# Patient Record
Sex: Female | Born: 1955 | Race: White | Hispanic: No | Smoking: Current every day smoker
Health system: Southern US, Community
[De-identification: ages and names within clinical notes are randomized; demographics above are authoritative.]

## PROBLEM LIST (undated history)

## (undated) DIAGNOSIS — R011 Cardiac murmur, unspecified: Secondary | ICD-10-CM

## (undated) DIAGNOSIS — J42 Unspecified chronic bronchitis: Secondary | ICD-10-CM

## (undated) DIAGNOSIS — E785 Hyperlipidemia, unspecified: Secondary | ICD-10-CM

## (undated) DIAGNOSIS — Z955 Presence of coronary angioplasty implant and graft: Secondary | ICD-10-CM

## (undated) DIAGNOSIS — I255 Ischemic cardiomyopathy: Secondary | ICD-10-CM

## (undated) DIAGNOSIS — R053 Chronic cough: Secondary | ICD-10-CM

## (undated) DIAGNOSIS — J449 Chronic obstructive pulmonary disease, unspecified: Secondary | ICD-10-CM

## (undated) DIAGNOSIS — K219 Gastro-esophageal reflux disease without esophagitis: Secondary | ICD-10-CM

## (undated) DIAGNOSIS — R05 Cough: Secondary | ICD-10-CM

## (undated) DIAGNOSIS — I2510 Atherosclerotic heart disease of native coronary artery without angina pectoris: Secondary | ICD-10-CM

## (undated) DIAGNOSIS — I499 Cardiac arrhythmia, unspecified: Secondary | ICD-10-CM

## (undated) DIAGNOSIS — Z720 Tobacco use: Secondary | ICD-10-CM

## (undated) DIAGNOSIS — I219 Acute myocardial infarction, unspecified: Secondary | ICD-10-CM

## (undated) DIAGNOSIS — Z972 Presence of dental prosthetic device (complete) (partial): Secondary | ICD-10-CM

## (undated) HISTORY — DX: Tobacco use: Z72.0

## (undated) HISTORY — DX: Ischemic cardiomyopathy: I25.5

## (undated) HISTORY — DX: Atherosclerotic heart disease of native coronary artery without angina pectoris: I25.10

## (undated) HISTORY — DX: Hyperlipidemia, unspecified: E78.5

---
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
## 2010-10-10 ENCOUNTER — Emergency Department: Payer: Self-pay | Admitting: Emergency Medicine

## 2015-06-03 DIAGNOSIS — H2513 Age-related nuclear cataract, bilateral: Secondary | ICD-10-CM | POA: Diagnosis not present

## 2015-07-26 ENCOUNTER — Encounter: Payer: Self-pay | Admitting: Emergency Medicine

## 2015-07-26 ENCOUNTER — Emergency Department
Admission: EM | Admit: 2015-07-26 | Discharge: 2015-07-26 | Disposition: A | Discharge status: Home / Self Care | Payer: Medicare Other | Attending: Emergency Medicine | Admitting: Emergency Medicine

## 2015-07-26 ENCOUNTER — Emergency Department: Payer: Medicare Other

## 2015-07-26 DIAGNOSIS — F17200 Nicotine dependence, unspecified, uncomplicated: Secondary | ICD-10-CM | POA: Diagnosis not present

## 2015-07-26 DIAGNOSIS — F17210 Nicotine dependence, cigarettes, uncomplicated: Secondary | ICD-10-CM | POA: Diagnosis not present

## 2015-07-26 DIAGNOSIS — J069 Acute upper respiratory infection, unspecified: Secondary | ICD-10-CM | POA: Insufficient documentation

## 2015-07-26 DIAGNOSIS — R05 Cough: Secondary | ICD-10-CM | POA: Diagnosis not present

## 2015-07-26 MED ORDER — IPRATROPIUM-ALBUTEROL 0.5-2.5 (3) MG/3ML IN SOLN
3.0000 mL | Freq: Once | RESPIRATORY_TRACT | Status: AC
  Administered 2015-07-26: 3 mL via RESPIRATORY_TRACT
  Filled 2015-07-26: qty 3

## 2015-07-26 MED ORDER — HYDROCOD POLST-CPM POLST ER 10-8 MG/5ML PO SUER: 5.0000 mL | Freq: Two times a day (BID) | ORAL | Status: DC | PRN

## 2015-07-26 MED ORDER — ALBUTEROL SULFATE HFA 108 (90 BASE) MCG/ACT IN AERS: 2.0000 | INHALATION_SPRAY | Freq: Four times a day (QID) | RESPIRATORY_TRACT | Status: DC | PRN

## 2015-07-26 NOTE — ED Notes (Signed)
Pt with congestion left. Clear BS right. A/o. Sob. Smoker x 45 yrs. Heart sounds normal

## 2015-07-26 NOTE — Discharge Instructions (Signed)
Follow-up with [facility] clinic if any continued problems. Tylenol or ibuprofen as needed for fever. Tussionex every 12 hours as needed for cough. Albuterol inhaler at 2 puffs 4 times a day as needed for wheezing.

## 2015-07-26 NOTE — ED Provider Notes (Signed)
[facility] Emergency Department Provider Note ____________________________________________  Time seen: Approximately 12:00 PM  I have reviewed the triage vital signs and the nursing notes.   HISTORY  Chief Complaint Cough and Nasal Congestion   HPI [name] is a 60 y.o. female is here complaining of cough and congestion for approximately one week. Patient states she has coughed and sneezed until she has pain in the left lateral rib cage due to coughing. She is unaware of any fever. She states she's been taking ibuprofen and Alka-Seltzer at home for her symptoms. She states she is a smoker of 45 years and currently smokes 1 pack cigarettes per day. She states that she has been on inhalers in the past but is not using one currently.Currently she rates her discomfort as a 6/10.  History reviewed. No pertinent past medical history.  There are no active problems to display for this patient.   History reviewed. No pertinent past surgical history.  Current Outpatient Rx  Name  Route  Sig  Dispense  Refill  . albuterol (PROVENTIL HFA;VENTOLIN HFA) 108 (90 Base) MCG/ACT inhaler   Inhalation   Inhale 2 puffs into the lungs every 6 (six) hours as needed for wheezing or shortness of breath.   1 Inhaler   2   . chlorpheniramine-HYDROcodone (TUSSIONEX PENNKINETIC ER) 10-8 MG/5ML SUER   Oral   Take 5 mLs by mouth every 12 (twelve) hours as needed for cough.   100 mL   0     Allergies Penicillins  History reviewed. No pertinent family history.  Social History Social History  Substance Use Topics  . Smoking status: Current Every Day Smoker  . Smokeless tobacco: None  . Alcohol Use: No    Review of Systems Constitutional: No fever/chills ENT: No sore throat. Cardiovascular: Left lateral rib pain Respiratory: Denies shortness of breath. Positive cough Gastrointestinal: No abdominal pain.  No nausea, no vomiting.  Musculoskeletal: Negative for  back pain. Skin: Negative for rash. Neurological: Negative for headaches  10-point ROS otherwise negative.  ____________________________________________   PHYSICAL EXAM:  VITAL SIGNS: ED Triage Vitals  Enc Vitals Group     BP 07/26/15 1124 160/108 mmHg     Pulse Rate 07/26/15 1124 75     Resp 07/26/15 1124 20     Temp 07/26/15 1124 97.8 F (36.6 C)     Temp Source 07/26/15 1124 Oral     SpO2 07/26/15 1124 95 %     Weight 07/26/15 1124 180 lb (81.647 kg)     Height 07/26/15 1124 5\' 4"  (1.626 m)     Head Cir --      Peak Flow --      Pain Score 07/26/15 1124 6     Pain Loc --      Pain Edu? --      Excl. in GC? --     Constitutional: Alert and oriented. Well appearing and in no acute distress. Eyes: Conjunctivae are normal. PERRL. EOMI. Head: Atraumatic. Nose: Minimal congestion/rhinnorhea. EACs and TMs are clear bilaterally. Mouth/Throat: Mucous membranes are moist.  Oropharynx non-erythematous. Mild posterior drainage present. Neck: No stridor.   Hematological/Lymphatic/Immunilogical: No cervical lymphadenopathy. Cardiovascular: Normal rate, regular rhythm. Grossly normal heart sounds.  Good peripheral circulation. Respiratory: Normal respiratory effort.  No retractions. Lungs there is a faint expiratory wheeze heard throughout with a very congested cough. Patient is in no respiratory distress. Musculoskeletal moves upper and lower extremities without any difficulty and normal gait was noted. Neurologic:  Normal speech and language. No gross focal neurologic deficits are appreciated. No gait instability. Skin:  Skin is warm, dry and intact. No rash noted. Psychiatric: Mood and affect are normal. Speech and behavior are normal.  ____________________________________________   LABS (all labs ordered are listed, but only abnormal results are displayed)  Labs Reviewed - No data to display ____________________________________________  RADIOLOGY  Chest x-ray per  radiologist shows no active cardiopulmonary disease. ____________________________________________   PROCEDURES  Procedure(s) performed: None  Critical Care performed: No  ____________________________________________   INITIAL IMPRESSION / ASSESSMENT AND PLAN / ED COURSE  Pertinent labs & imaging results that were available during my care of the patient were reviewed by me and considered in my medical decision making (see chart for details).  Patient is given a DuoNeb treatment while in the emergency room and wheezing cleared completely. Patient is moving more air and also notices a difference. Patient was discharged with albuterol inhaler and Tussionex as needed for cough. She is follow-up with [facility] clinic if any continued problems. ____________________________________________   FINAL CLINICAL IMPRESSION(S) / ED DIAGNOSES  Final diagnoses:  Acute upper respiratory infection      Tommi Rumps, PA-C 07/26/15 1330  Sharman Cheek, MD 07/26/15 [phone]

## 2015-07-26 NOTE — ED Notes (Signed)
Pt to ed with c/o cough, congestion x 1 week.  Pt states she has coughed so much now she is having left side pain with deep breath, movement, and with cough or sneezing.

## 2015-09-28 ENCOUNTER — Emergency Department: Payer: Medicare Other

## 2015-09-28 ENCOUNTER — Emergency Department
Admission: EM | Admit: 2015-09-28 | Discharge: 2015-09-28 | Disposition: A | Discharge status: Home / Self Care | Payer: Medicare Other | Attending: Emergency Medicine | Admitting: Emergency Medicine

## 2015-09-28 ENCOUNTER — Encounter: Payer: Self-pay | Admitting: Emergency Medicine

## 2015-09-28 DIAGNOSIS — F17200 Nicotine dependence, unspecified, uncomplicated: Secondary | ICD-10-CM | POA: Diagnosis not present

## 2015-09-28 DIAGNOSIS — S63282A Dislocation of proximal interphalangeal joint of right middle finger, initial encounter: Secondary | ICD-10-CM | POA: Insufficient documentation

## 2015-09-28 DIAGNOSIS — S62602A Fracture of unspecified phalanx of right middle finger, initial encounter for closed fracture: Secondary | ICD-10-CM | POA: Diagnosis not present

## 2015-09-28 DIAGNOSIS — Y9389 Activity, other specified: Secondary | ICD-10-CM | POA: Insufficient documentation

## 2015-09-28 DIAGNOSIS — Z79899 Other long term (current) drug therapy: Secondary | ICD-10-CM | POA: Insufficient documentation

## 2015-09-28 DIAGNOSIS — Y999 Unspecified external cause status: Secondary | ICD-10-CM | POA: Insufficient documentation

## 2015-09-28 DIAGNOSIS — Y929 Unspecified place or not applicable: Secondary | ICD-10-CM | POA: Diagnosis not present

## 2015-09-28 DIAGNOSIS — S6991XA Unspecified injury of right wrist, hand and finger(s), initial encounter: Secondary | ICD-10-CM | POA: Diagnosis present

## 2015-09-28 DIAGNOSIS — W010XXA Fall on same level from slipping, tripping and stumbling without subsequent striking against object, initial encounter: Secondary | ICD-10-CM | POA: Insufficient documentation

## 2015-09-28 DIAGNOSIS — S63272A Dislocation of unspecified interphalangeal joint of right middle finger, initial encounter: Secondary | ICD-10-CM | POA: Diagnosis not present

## 2015-09-28 DIAGNOSIS — S63252A Unspecified dislocation of right middle finger, initial encounter: Secondary | ICD-10-CM | POA: Diagnosis not present

## 2015-09-28 DIAGNOSIS — S63259A Unspecified dislocation of unspecified finger, initial encounter: Secondary | ICD-10-CM

## 2015-09-28 MED ORDER — HYDROCODONE-ACETAMINOPHEN 5-325 MG PO TABS: 1.0000 | ORAL_TABLET | ORAL | Status: DC | PRN

## 2015-09-28 MED ORDER — OXYCODONE-ACETAMINOPHEN 5-325 MG PO TABS
1.0000 | ORAL_TABLET | Freq: Once | ORAL | Status: AC
  Administered 2015-09-28: 1 via ORAL
  Filled 2015-09-28: qty 1

## 2015-09-28 NOTE — ED Notes (Signed)
See triage   States she got tangled up in bed sheets this am and fell  havingpain and swelling to right middle finger

## 2015-09-28 NOTE — ED Provider Notes (Signed)
[facility] Emergency Department Provider Note  ____________________________________________  Time seen: On arrival  I have reviewed the triage vital signs and the nursing notes.   HISTORY  Chief Complaint Finger Injury    HPI [name] is a 60 y.o. female who presents with right middle finger pain. Patient tripped 2 days ago and injured her finger. She complains of moderate pain and swelling and inability to move the finger. No other injuries.    History reviewed. No pertinent past medical history.  There are no active problems to display for this patient.   History reviewed. No pertinent past surgical history.  Current Outpatient Rx  Name  Route  Sig  Dispense  Refill  . albuterol (PROVENTIL HFA;VENTOLIN HFA) 108 (90 Base) MCG/ACT inhaler   Inhalation   Inhale 2 puffs into the lungs every 6 (six) hours as needed for wheezing or shortness of breath.   1 Inhaler   2   . chlorpheniramine-HYDROcodone (TUSSIONEX PENNKINETIC ER) 10-8 MG/5ML SUER   Oral   Take 5 mLs by mouth every 12 (twelve) hours as needed for cough.   100 mL   0     Allergies Penicillins  No family history on file.  Social History Social History  Substance Use Topics  . Smoking status: Current Every Day Smoker  . Smokeless tobacco: None  . Alcohol Use: No    Review of Systems  Constitutional: Positive for anxiety Eyes: Negative for visual changes. ENT: Negative for neck pain    Musculoskeletal: Finger pain as above Skin: Negative for rash. Neurological: Negative for headaches or focal weakness   ____________________________________________   PHYSICAL EXAM:  VITAL SIGNS: ED Triage Vitals  Enc Vitals Group     BP 09/28/15 1009 178/107 mmHg     Pulse Rate 09/28/15 1009 99     Resp 09/28/15 1009 18     Temp 09/28/15 1009 98.2 F (36.8 C)     Temp Source 09/28/15 1009 Oral     SpO2 09/28/15 1009 99 %     Weight --      Height --      Head  Cir --      Peak Flow --      Pain Score 09/28/15 1009 10     Pain Loc --      Pain Edu? --      Excl. in GC? --    Constitutional: Alert and oriented. No acute distress Eyes: Conjunctivae are normal.  ENT   Head: Normocephalic and atraumatic.   Mouth/Throat: Mucous membranes are moist.   Musculoskeletal: Patient with swelling to the right middle finger PIP joint , she reports decreased sensation distally, 2+ distal cap refill, no other injuries Neurologic:  Normal speech and language. No gross focal neurologic deficits are appreciated. Skin:  Skin is warm, dry and intact. No rash noted. Psychiatric: Anxious  ____________________________________________    LABS (pertinent positives/negatives)  Labs Reviewed - No data to display  ____________________________________________     ____________________________________________    RADIOLOGY I have personally reviewed any xrays that were ordered on this patient: Posterior dislocation  ____________________________________________   PROCEDURES  Procedure(s) performed: yes Reduction of dislocation Date/Time: 11:32 AM Performed by: Jene Every Authorized by: Jene Every Consent: Verbal consent obtained. Risks and benefits: risks, benefits and alternatives were discussed Consent given by: patient   Patient given percocet prior to procedure   Patient tolerance: Patient tolerated the procedure well with no immediate complications. Joint: right PIP joint,  middle finger Reduction technique: superior and axial traction applied briefly  Repeat x-ray shows partial relocation, I applied further pressure on the phalanx and felt it pop into place again, reexamined the patient she is able to bend the finger normally, we will splint and have her follow-up with orthopedics     ____________________________________________   INITIAL IMPRESSION / ASSESSMENT AND PLAN / ED COURSE  Pertinent labs & imaging results  that were available during my care of the patient were reviewed by me and considered in my medical decision making (see chart for details).  Patient present with posterior dislocation of the PIP joint. Given Percocet and reduction performed by myself with the assistance of PA student. Patient tolerated well.  Xray shows partial relocation, I attempted to further reduce but patient refused further manipulation. We will splint and have her follow up with ortho  ____________________________________________   FINAL CLINICAL IMPRESSION(S) / ED DIAGNOSES  Final diagnoses:  Finger dislocation, initial encounter     Jene Every, MD 09/28/15 1152

## 2015-09-28 NOTE — ED Notes (Signed)
Pt presents with right hand middle finger pain. Pt got tripped up in her bed sheets this am trying to get out of bed and fell injuring finger.

## 2015-09-28 NOTE — Discharge Instructions (Signed)
Finger Dislocation Finger dislocation is the displacement of bones in your finger at the joints. Most commonly, finger dislocation occurs at the proximal interphalangeal joint (the joint closest to your knuckle). Very strong, fibrous tissues (ligaments) and joint capsules connect the three bones of your fingers.  CAUSES Dislocation is caused by a forceful impact. This impact moves these bones off the joint and often tears your ligaments.  SYMPTOMS Symptoms of finger dislocation include:  Deformity of your finger.  Pain, with loss of movement. DIAGNOSIS  Finger dislocation is diagnosed with a physical exam. Often, X-ray exams are done to see if you have associated injuries, such as bone fractures. TREATMENT  Finger dislocations are treated by putting your bones back into position (reduction) either by manually moving the bones back into place or through surgery. Your finger is then kept in a fixed position (immobilized) with the use of a dressing or splint for a brief period. When your ligament has to be surgically repaired, it needs to be kept in a fixed position with a dressing or splint for 1 to 2 weeks. Because joint stiffness is a long-term complication of finger dislocation, hand exercises or physical therapy to increase the range of motion and to regain strength is usually started as soon as the ligament is healed. Exercises and therapy generally last no more than 3 months. HOME CARE INSTRUCTIONS The following measures can help to reduce pain and speed up the healing process:  Rest your injured joint. Do not move until instructed otherwise by your caregiver. Avoid activities similar to the one that caused your injury.  Apply ice to your injured joint for the first day or 2 after your reduction or as directed by your caregiver. Applying ice helps to reduce inflammation and pain.  Put ice in a plastic bag.  Place a towel between your skin and the bag.  Leave the ice on for 15-20 minutes  at a time, every 2 hours while you are awake.  Elevate your hand above your heart as directed by your caregiver to reduce swelling.  Take over-the-counter or prescription medicine for pain as your caregiver instructs you. SEEK IMMEDIATE MEDICAL CARE IF:  Your dressing or splint becomes damaged.  Your pain becomes worse rather than better.  You lose feeling in your finger, or it becomes cold and white. MAKE SURE YOU:  Understand these instructions.  Will watch your condition.  Will get help right away if you are not doing well or get worse.   This information is not intended to replace advice given to you by your health care provider. Make sure you discuss any questions you have with your health care provider.   Document Released: 04/14/2000 Document Revised: 05/08/2014 Document Reviewed: 09/11/2014 Elsevier Interactive Patient Education 2016 [employer]  

## 2015-09-30 DIAGNOSIS — I219 Acute myocardial infarction, unspecified: Secondary | ICD-10-CM

## 2015-09-30 HISTORY — DX: Acute myocardial infarction, unspecified: I21.9

## 2015-10-03 ENCOUNTER — Inpatient Hospital Stay
Admission: EM | Admit: 2015-10-03 | Discharge: 2015-10-05 | DRG: 247 | Disposition: A | Discharge status: Home / Self Care | Payer: Medicare Other | Attending: Internal Medicine | Admitting: Internal Medicine

## 2015-10-03 ENCOUNTER — Encounter: Payer: Self-pay | Admitting: Internal Medicine

## 2015-10-03 ENCOUNTER — Emergency Department: Payer: Medicare Other

## 2015-10-03 DIAGNOSIS — K219 Gastro-esophageal reflux disease without esophagitis: Secondary | ICD-10-CM | POA: Diagnosis present

## 2015-10-03 DIAGNOSIS — F17210 Nicotine dependence, cigarettes, uncomplicated: Secondary | ICD-10-CM | POA: Diagnosis present

## 2015-10-03 DIAGNOSIS — E785 Hyperlipidemia, unspecified: Secondary | ICD-10-CM | POA: Diagnosis present

## 2015-10-03 DIAGNOSIS — Z8249 Family history of ischemic heart disease and other diseases of the circulatory system: Secondary | ICD-10-CM

## 2015-10-03 DIAGNOSIS — R079 Chest pain, unspecified: Secondary | ICD-10-CM | POA: Diagnosis not present

## 2015-10-03 DIAGNOSIS — Z79899 Other long term (current) drug therapy: Secondary | ICD-10-CM

## 2015-10-03 DIAGNOSIS — I10 Essential (primary) hypertension: Secondary | ICD-10-CM | POA: Diagnosis present

## 2015-10-03 DIAGNOSIS — I2510 Atherosclerotic heart disease of native coronary artery without angina pectoris: Secondary | ICD-10-CM | POA: Diagnosis present

## 2015-10-03 DIAGNOSIS — I214 Non-ST elevation (NSTEMI) myocardial infarction: Secondary | ICD-10-CM | POA: Diagnosis present

## 2015-10-03 DIAGNOSIS — I249 Acute ischemic heart disease, unspecified: Secondary | ICD-10-CM | POA: Diagnosis not present

## 2015-10-03 DIAGNOSIS — Z720 Tobacco use: Secondary | ICD-10-CM | POA: Diagnosis not present

## 2015-10-03 LAB — PROTIME-INR
INR: 1.05
PROTHROMBIN TIME: 13.9 s (ref 11.4–15.0)

## 2015-10-03 LAB — BASIC METABOLIC PANEL
ANION GAP: 9 (ref 5–15)
BUN: 15 mg/dL (ref 6–20)
CALCIUM: 9.6 mg/dL (ref 8.9–10.3)
CO2: 26 mmol/L (ref 22–32)
CREATININE: 1.06 mg/dL — AB (ref 0.44–1.00)
Chloride: 104 mmol/L (ref 101–111)
GFR, EST NON AFRICAN AMERICAN: 55 mL/min — AB (ref >60)
GLUCOSE: 104 mg/dL — AB (ref 65–99)
Potassium: 4.5 mmol/L (ref 3.5–5.1)
Sodium: 139 mmol/L (ref 135–145)

## 2015-10-03 LAB — LIPID PANEL
Cholesterol: 284 mg/dL — ABNORMAL HIGH (ref 0–200)
HDL: 62 mg/dL (ref >40)
LDL CALC: 189 mg/dL — AB (ref 0–99)
TRIGLYCERIDES: 166 mg/dL — AB (ref <150)
Total CHOL/HDL Ratio: 4.6 RATIO
VLDL: 33 mg/dL (ref 0–40)

## 2015-10-03 LAB — CBC
HCT: 42.8 % (ref 35.0–47.0)
HEMOGLOBIN: 14.6 g/dL (ref 12.0–16.0)
MCH: 30.7 pg (ref 26.0–34.0)
MCHC: 34.1 g/dL (ref 32.0–36.0)
MCV: 89.9 fL (ref 80.0–100.0)
PLATELETS: 268 10*3/uL (ref 150–440)
RBC: 4.76 MIL/uL (ref 3.80–5.20)
RDW: 15.1 % — ABNORMAL HIGH (ref 11.5–14.5)
WBC: 8.2 10*3/uL (ref 3.6–11.0)

## 2015-10-03 LAB — APTT: APTT: 24 s (ref 24–36)

## 2015-10-03 LAB — TROPONIN I
TROPONIN I: 0.16 ng/mL — AB (ref <0.031)
Troponin I: 0.18 ng/mL — ABNORMAL HIGH (ref <0.031)

## 2015-10-03 MED ORDER — ASPIRIN EC 325 MG PO TBEC
325.0000 mg | DELAYED_RELEASE_TABLET | Freq: Every day | ORAL | Status: DC
  Administered 2015-10-04: 325 mg via ORAL
  Filled 2015-10-03: qty 1

## 2015-10-03 MED ORDER — MORPHINE SULFATE (PF) 2 MG/ML IV SOLN
2.0000 mg | INTRAVENOUS | Status: DC | PRN
Start: 2015-10-03 — End: 2015-10-05

## 2015-10-03 MED ORDER — NICOTINE 21 MG/24HR TD PT24
21.0000 mg | MEDICATED_PATCH | Freq: Every day | TRANSDERMAL | Status: DC
  Administered 2015-10-03 – 2015-10-05 (×3): 21 mg via TRANSDERMAL
  Filled 2015-10-03 (×3): qty 1

## 2015-10-03 MED ORDER — ONDANSETRON HCL 4 MG/2ML IJ SOLN: 4.0000 mg | Freq: Four times a day (QID) | INTRAMUSCULAR | Status: DC | PRN

## 2015-10-03 MED ORDER — HYDRALAZINE HCL 20 MG/ML IJ SOLN: 10.0000 mg | Freq: Four times a day (QID) | INTRAMUSCULAR | Status: DC | PRN

## 2015-10-03 MED ORDER — ATORVASTATIN CALCIUM 20 MG PO TABS: 40.0000 mg | ORAL_TABLET | Freq: Every day | ORAL | Status: DC

## 2015-10-03 MED ORDER — NITROGLYCERIN 2 % TD OINT
1.0000 [in_us] | TOPICAL_OINTMENT | Freq: Once | TRANSDERMAL | Status: AC
  Administered 2015-10-03: 1 [in_us] via TOPICAL
  Filled 2015-10-03: qty 1

## 2015-10-03 MED ORDER — SODIUM CHLORIDE 0.9% FLUSH
3.0000 mL | Freq: Two times a day (BID) | INTRAVENOUS | Status: DC
  Administered 2015-10-04: 3 mL via INTRAVENOUS

## 2015-10-03 MED ORDER — ACETAMINOPHEN 325 MG PO TABS: 650.0000 mg | ORAL_TABLET | Freq: Four times a day (QID) | ORAL | Status: DC | PRN

## 2015-10-03 MED ORDER — HEPARIN BOLUS VIA INFUSION
3000.0000 [IU] | Freq: Once | INTRAVENOUS | Status: AC
  Administered 2015-10-03: 3000 [IU] via INTRAVENOUS
  Filled 2015-10-03: qty 3000

## 2015-10-03 MED ORDER — ACETAMINOPHEN 650 MG RE SUPP: 650.0000 mg | Freq: Four times a day (QID) | RECTAL | Status: DC | PRN

## 2015-10-03 MED ORDER — OXYCODONE HCL 5 MG PO TABS: 5.0000 mg | ORAL_TABLET | ORAL | Status: DC | PRN

## 2015-10-03 MED ORDER — METOPROLOL TARTRATE 25 MG PO TABS
25.0000 mg | ORAL_TABLET | Freq: Two times a day (BID) | ORAL | Status: DC
  Administered 2015-10-03 – 2015-10-04 (×2): 25 mg via ORAL
  Filled 2015-10-03 (×4): qty 1

## 2015-10-03 MED ORDER — SODIUM CHLORIDE 0.9 % IV SOLN
INTRAVENOUS | Status: DC
  Administered 2015-10-03: 21:00:00 via INTRAVENOUS

## 2015-10-03 MED ORDER — HEPARIN (PORCINE) IN NACL 100-0.45 UNIT/ML-% IJ SOLN
850.0000 [IU]/h | INTRAMUSCULAR | Status: DC
  Administered 2015-10-03: 850 [IU]/h via INTRAVENOUS
  Filled 2015-10-03 (×2): qty 250

## 2015-10-03 MED ORDER — NITROGLYCERIN 2 % TD OINT
0.5000 [in_us] | TOPICAL_OINTMENT | Freq: Four times a day (QID) | TRANSDERMAL | Status: DC
  Administered 2015-10-04 – 2015-10-05 (×4): 0.5 [in_us] via TOPICAL
  Filled 2015-10-03 (×4): qty 1

## 2015-10-03 MED ORDER — ASPIRIN 81 MG PO CHEW
324.0000 mg | CHEWABLE_TABLET | Freq: Once | ORAL | Status: AC
  Administered 2015-10-03: 324 mg via ORAL
  Filled 2015-10-03: qty 4

## 2015-10-03 MED ORDER — ONDANSETRON HCL 4 MG PO TABS: 4.0000 mg | ORAL_TABLET | Freq: Four times a day (QID) | ORAL | Status: DC | PRN

## 2015-10-03 NOTE — Progress Notes (Addendum)
ANTICOAGULATION CONSULT NOTE - Initial Consult  Pharmacy Consult for heparin drip Indication: chest pain/ACS  Allergies  Allergen Reactions  . Penicillins Rash    Has patient had a PCN reaction causing immediate rash, facial/tongue/throat swelling, SOB or lightheadedness with hypotension: Yes Has patient had a PCN reaction causing severe rash involving mucus membranes or skin necrosis: No Has patient had a PCN reaction that required hospitalization No Has patient had a PCN reaction occurring within the last 10 years: No If all of the above answers are "NO", then may proceed with Cephalosporin use.   Patient Measurements: Height: 5\' 4"  (162.6 cm) Weight: 180 lb 6.4 oz (81.829 kg) IBW/kg (Calculated) : 54.7 Heparin Dosing Weight: 72 kg  Vital Signs: Temp: 98.1 F (36.7 C) (06/04 2119) Temp Source: Oral (06/04 2119) BP: 158/97 mmHg (06/04 2119) Pulse Rate: 73 (06/04 2119)  Labs:  Recent Labs  10/03/15 1707  HGB 14.6  HCT 42.8  PLT 268  APTT 24  LABPROT 13.9  INR 1.05  CREATININE 1.06*  TROPONINI 0.16*    Estimated Creatinine Clearance: 57.6 mL/min (by C-G formula based on Cr of 1.06).  Medical History: History reviewed. No pertinent past medical history.  Medications:  Scheduled:  . aspirin EC  325 mg Oral Daily  . [START ON 10/04/2015] atorvastatin  40 mg Oral q1800  . metoprolol tartrate  25 mg Oral BID  . nicotine  21 mg Transdermal Daily  . nitroGLYCERIN  0.5 inch Topical Q6H  . sodium chloride flush  3 mL Intravenous Q12H   Assessment: Pharmacy consulted to dose and monitor heparin drip in this 60 year old female for chest pain/ACS. Patient was not taking anticoagulants prior to admission.   Baseline labs: Hgb 14.6 Plt 268  INR 1.05 APTT 24  Goal of Therapy:  Heparin level 0.3-0.7 units/ml Monitor platelets by anticoagulation protocol: Yes   Plan:  Give 3000 units bolus x 1 Start heparin infusion at 850 units/hr Check anti-Xa level in 6 hours and  daily while on heparin Continue to monitor H&H and platelets   6/5 04:00 heparin level 0.35. Recheck in 6 hours to confirm.  Cindi Carbon, PharmD 10/03/2015,10:14 PM

## 2015-10-03 NOTE — ED Notes (Signed)
Spoke with Dr. Nemiah Commander, patient can leave current nitro patch on until she reaches the floor.

## 2015-10-03 NOTE — H&P (Addendum)
[facility] [place] [facility]   PATIENT NAME: [name]    MR#:  [id number]  DATE OF BIRTH:  [birth year]  DATE OF ADMISSION:  10/03/2015  PRIMARY CARE PHYSICIAN: No PCP Per Patient   REQUESTING/REFERRING PHYSICIAN: Dr. Lacretia Nicks  CHIEF COMPLAINT:   Chief Complaint  Patient presents with  . Chest Pain    HISTORY OF PRESENT ILLNESS:  [name]  is a 60 y.o. female with  No known past medical history, hasn't seen a physician in several years presents to the hospital secondary to ongoing chest pain for 2 days. Patient's pain started yesterday, when she was sitting by the side of the bed. She has occasional acid reflux so she laid down and that felt better. She didn't have the pain up until today, it was more intense, cramping pain in her chest radiating to her jaws. She was also nauseous and was noted to be sweating profusely during that time. Denies any fevers, chills, cold or congestion. Continues to smoke about 1 pack per day and has 50-pack-year smoking history. Mom died from heart attack in her early 10's. EKG here showed T-wave inversions in 1, aVL and V2 to V6 leads. Also first troponin is elevated at 0.16. So patient is being admitted for NSTEMI.  PAST MEDICAL HISTORY:  No past medical history on file.  PAST SURGICAL HISTORY:  No past surgical history on file.  SOCIAL HISTORY:   Social History  Substance Use Topics  . Smoking status: Current Every Day Smoker  . Smokeless tobacco: Not on file  . Alcohol Use: No    FAMILY HISTORY:  No family history on file.  DRUG ALLERGIES:   Allergies  Allergen Reactions  . Penicillins Rash    REVIEW OF SYSTEMS:   Review of Systems  Constitutional: Negative for fever, chills, weight loss and malaise/fatigue.  HENT: Negative for ear discharge, ear pain, hearing loss, nosebleeds and tinnitus.   Eyes: Negative for blurred vision, double vision and photophobia.  Respiratory:  Negative for cough, hemoptysis, shortness of breath and wheezing.   Cardiovascular: Positive for chest pain. Negative for palpitations, orthopnea and leg swelling.  Gastrointestinal: Positive for nausea. Negative for heartburn, vomiting, abdominal pain, diarrhea, constipation and melena.  Genitourinary: Negative for dysuria, urgency, frequency and hematuria.  Musculoskeletal: Negative for myalgias, back pain and neck pain.  Skin: Negative for rash.  Neurological: Negative for dizziness, tingling, tremors, sensory change, speech change, focal weakness and headaches.  Endo/Heme/Allergies: Does not bruise/bleed easily.  Psychiatric/Behavioral: Negative for depression.    MEDICATIONS AT HOME:   Prior to Admission medications   Medication Sig Start Date End Date Taking? Authorizing Provider  albuterol (PROVENTIL HFA;VENTOLIN HFA) 108 (90 Base) MCG/ACT inhaler Inhale 2 puffs into the lungs every 6 (six) hours as needed for wheezing or shortness of breath. 07/26/15  Yes Tommi Rumps, PA-C  chlorpheniramine-HYDROcodone (TUSSIONEX PENNKINETIC ER) 10-8 MG/5ML SUER Take 5 mLs by mouth every 12 (twelve) hours as needed for cough. 07/26/15  Yes Tommi Rumps, PA-C  HYDROcodone-acetaminophen (NORCO/VICODIN) 5-325 MG tablet Take 1 tablet by mouth every 4 (four) hours as needed for moderate pain. 09/28/15  Yes Jene Every, MD      VITAL SIGNS:  Blood pressure 156/99, pulse 66, temperature 98 F (36.7 C), temperature source Oral, resp. rate 12, height 5\' 4"  (1.626 m), weight 81.647 kg (180 lb), SpO2 97 %.  PHYSICAL EXAMINATION:   Physical Exam  GENERAL:  60 y.o.-year-old patient sitting in  the bed with no acute distress.  EYES: Pupils equal, round, reactive to light and accommodation. No scleral icterus. Extraocular muscles intact.  HEENT: Head atraumatic, normocephalic. Oropharynx and nasopharynx clear.  NECK:  Supple, no jugular venous distention. No thyroid enlargement, no tenderness.   LUNGS: Normal breath sounds bilaterally, no wheezing, rales,rhonchi or crepitation. No use of accessory muscles of respiration.  CARDIOVASCULAR: S1, S2 normal. No murmurs, rubs, or gallops.  ABDOMEN: Soft, nontender, nondistended. Bowel sounds present. No organomegaly or mass.  EXTREMITIES: No pedal edema, cyanosis, or clubbing.  NEUROLOGIC: Cranial nerves II through XII are intact. Muscle strength 5/5 in all extremities. Sensation intact. Gait not checked.  PSYCHIATRIC: The patient is alert and oriented x 3.  SKIN: No obvious rash, lesion, or ulcer.   LABORATORY PANEL:   CBC  Recent Labs Lab 10/03/15 1707  WBC 8.2  HGB 14.6  HCT 42.8  PLT 268   ------------------------------------------------------------------------------------------------------------------  Chemistries   Recent Labs Lab 10/03/15 1707  NA 139  K 4.5  CL 104  CO2 26  GLUCOSE 104*  BUN 15  CREATININE 1.06*  CALCIUM 9.6   ------------------------------------------------------------------------------------------------------------------  Cardiac Enzymes  Recent Labs Lab 10/03/15 1707  TROPONINI 0.16*   ------------------------------------------------------------------------------------------------------------------  RADIOLOGY:  Dg Chest 2 View  10/03/2015  CLINICAL DATA:  Chest pain radiating to neck and jaw beginning yesterday. Nausea. EXAM: CHEST  2 VIEW COMPARISON:  07/26/2015 FINDINGS: The heart size and mediastinal contours are within normal limits. Both lungs are clear. The visualized skeletal structures are unremarkable. IMPRESSION: No active cardiopulmonary disease. Electronically Signed   By: Myles Rosenthal M.D.   On: 10/03/2015 17:56    EKG:   Orders placed or performed during the hospital encounter of 10/03/15  . EKG 12-Lead  . EKG 12-Lead  . ED EKG within 10 minutes  . ED EKG within 10 minutes    IMPRESSION AND PLAN:   [name]  is a 60 y.o. female with  No known past  medical history, hasn't seen a physician in several years presents to the hospital secondary to ongoing chest pain for 2 days.  #1 NSTEMI- admit to tele, risk factors- family history and also smoking - IV heparin drip, asa, nitro paste, metoprolol. Start statin as well - recycle troponins - ECHO, cards consult- [facility] - lipid panel, a1c check  #2 HTN- BP elevated now, no prior history, on metoprolol and also nitro paste  #3 DVT prophylaxis- already on heparin drip  #4 Tobacco use disorder- counseled, not ready to quit, 50 pack year smoking history and ongoing smoking. Nicotine patch    All the records are reviewed and case discussed with ED provider. Management plans discussed with the patient, family and they are in agreement.  CODE STATUS: Full Code  TOTAL TIME TAKING CARE OF THIS PATIENT: 50 minutes.    Enid Baas M.D on 10/03/2015 at 6:34 PM  Between 7am to 6pm - Pager - [phone]  After 6pm go to [email or web address] - password [email or web address] [facility]  [facility]  Office  [phone]  CC: Primary care physician; No PCP Per Patient

## 2015-10-03 NOTE — ED Notes (Signed)
Pt reports chest pain that started yesterday comes and goes Radiates up to throat and jaw. Pt denies SOB . C/o nausea.

## 2015-10-03 NOTE — ED Notes (Signed)
Pt educated in regards to heparin and benefits of having a heparin drip.  Pt educated on common causes and risk factors for heart disease and heart attacks.  Pt verbalized understanding.

## 2015-10-03 NOTE — ED Notes (Signed)
Attending MD Dr. Nemiah Commander at bedside

## 2015-10-03 NOTE — ED Notes (Signed)
Dr. Huel Cote notified of patient's troponin level.

## 2015-10-03 NOTE — ED Provider Notes (Signed)
Time Seen: Approximately 1650 I have reviewed the triage notes  Chief Complaint: Chest Pain   History of Present Illness: [name] is a 60 y.o. female who presents with some intermittent chest discomfort that started yesterday morning. She states the pain lasts "" a couple of minutes "". She states the pain will go up in the anterior jaw region without radiation to the arm or back. She gets nauseated with no vomiting. She gets diaphoretic but no shortness of breath. Significant past medical history and spent a while since she has seen a physician. She denies any fever, chills, pleuritic or positional component. She denies any new calf tenderness or swelling.   No past medical history on file.  Patient Active Problem List   Diagnosis Date Noted  . NSTEMI (non-ST elevated myocardial infarction) (HCC) 10/03/2015    No past surgical history on file.  No past surgical history on file.  Current Outpatient Rx  Name  Route  Sig  Dispense  Refill  . albuterol (PROVENTIL HFA;VENTOLIN HFA) 108 (90 Base) MCG/ACT inhaler   Inhalation   Inhale 2 puffs into the lungs every 6 (six) hours as needed for wheezing or shortness of breath.   1 Inhaler   2   . chlorpheniramine-HYDROcodone (TUSSIONEX PENNKINETIC ER) 10-8 MG/5ML SUER   Oral   Take 5 mLs by mouth every 12 (twelve) hours as needed for cough.   100 mL   0   . HYDROcodone-acetaminophen (NORCO/VICODIN) 5-325 MG tablet   Oral   Take 1 tablet by mouth every 4 (four) hours as needed for moderate pain.   20 tablet   0     Allergies:  Penicillins  Family History: Family History  Problem Relation Age of Onset  . CAD Mother   . Colon cancer Mother     Social History: Social History  Substance Use Topics  . Smoking status: Current Every Day Smoker -- 1.00 packs/day    Types: Cigarettes  . Smokeless tobacco: Not on file     Comment: for 50 years now  . Alcohol Use: No     Review of Systems:   10 point review of  systems was performed and was otherwise negative:  Constitutional: No fever Eyes: No visual disturbances ENT: No sore throat, ear pain Cardiac: No Current chest pain Respiratory: No current shortness of breath, wheezing, or stridor Abdomen: No abdominal pain, no vomiting, No diarrhea Endocrine: No weight loss, No night sweats Extremities: No peripheral edema, cyanosis Skin: No rashes, easy bruising Neurologic: No focal weakness, trouble with speech or swollowing Urologic: No dysuria, Hematuria, or urinary frequency   Physical Exam:  ED Triage Vitals  Enc Vitals Group     BP 10/03/15 1646 189/118 mmHg     Pulse Rate 10/03/15 1646 86     Resp 10/03/15 1646 20     Temp 10/03/15 1646 98 F (36.7 C)     Temp Source 10/03/15 1646 Oral     SpO2 10/03/15 1646 97 %     Weight 10/03/15 1646 180 lb (81.647 kg)     Height 10/03/15 1646 5\' 4"  (1.626 m)     Head Cir --      Peak Flow --      Pain Score 10/03/15 1709 0     Pain Loc --      Pain Edu? --      Excl. in GC? --     General: Awake , Alert , and Oriented times 3;  GCS 15 Head: Normal cephalic , atraumatic Eyes: Pupils equal , round, reactive to light Nose/Throat: No nasal drainage, patent upper airway without erythema or exudate.  Neck: Supple, Full range of motion, No anterior adenopathy or palpable thyroid masses Lungs: Clear to ascultation without wheezes , rhonchi, or rales Heart: Regular rate, regular rhythm without murmurs , gallops , or rubs Abdomen: Soft, non tender without rebound, guarding , or rigidity; bowel sounds positive and symmetric in all 4 quadrants. No organomegaly .        Extremities: 2 plus symmetric pulses. No edema, clubbing or cyanosis Neurologic: normal ambulation, Motor symmetric without deficits, sensory intact Skin: warm, dry, no rashes   Labs:   All laboratory work was reviewed including any pertinent negatives or positives listed below:  Labs Reviewed  BASIC METABOLIC PANEL - Abnormal;  Notable for the following:    Glucose, Bld 104 (*)    Creatinine, Ser 1.06 (*)    GFR calc non Af Amer 55 (*)    All other components within normal limits  CBC - Abnormal; Notable for the following:    RDW 15.1 (*)    All other components within normal limits  TROPONIN I - Abnormal; Notable for the following:    Troponin I 0.16 (*)    All other components within normal limits  Of concern is an elevated troponin.  EKG:  ED ECG REPORT I, Jennye Moccasin, the attending physician, personally viewed and interpreted this ECG.  Date: 10/03/2015 EKG Time: 1643 Rate: 75 Rhythm: normal sinus rhythm QRS Axis: normal Intervals: normal ST/T Wave abnormalities: Marked in first T-wave abnormalities. No ST elevation or depression Conduction Disturbances: none Narrative Interpretation: No comparison EKG available. Concern for acute ischemia  Radiology:   DG Chest 2 View (Final result) Result time: 10/03/15 17:56:35   Final result by Rad Results In Interface (10/03/15 17:56:35)   Narrative:   CLINICAL DATA: Chest pain radiating to neck and jaw beginning yesterday. Nausea.  EXAM: CHEST 2 VIEW  COMPARISON: 07/26/2015  FINDINGS: The heart size and mediastinal contours are within normal limits. Both lungs are clear. The visualized skeletal structures are unremarkable.  IMPRESSION: No active cardiopulmonary disease.   Electronically Signed By: Myles Rosenthal M.D. On: 10/03/2015 17:56         I personally reviewed the radiologic studies    Critical Care: * CRITICAL CARE Performed by: Jennye Moccasin   Total critical care time: 35 minutes  Critical care time was exclusive of separately billable procedures and treating other patients.  Critical care was necessary to treat or prevent imminent or life-threatening deterioration.  Critical care was time spent personally by me on the following activities: development of treatment plan with patient and/or surrogate as  well as nursing, discussions with consultants, evaluation of patient's response to treatment, examination of patient, obtaining history from patient or surrogate, ordering and performing treatments and interventions, ordering and review of laboratory studies, ordering and review of radiographic studies, pulse oximetry and re-evaluation of patient's condition. Workup for acute coronary syndrome    ED Course:  Patient appears to be suffering from acute coronary syndrome and a non-ST elevation myocardial infarction. She'll after evaluation she received aspirin and nitroglycerin paste. She has an IV established and was placed on a continuous cardiac monitor which remained in normal sinus rhythm without ectopy. She has no prior EKGs and EKG shows concern with this deep going anterior T waves. The patient's blood pressure and other vital signs appeared stable. Patient's  case was reviewed with the hospitalist team with likely cardiology consultation.    Assessment: Acute coronary syndrome Non-ST elevation myocardial infarction Tobacco history  Plan: Inpatient management*            Jennye Moccasin, MD 10/03/15 [phone]

## 2015-10-04 ENCOUNTER — Encounter: Payer: Self-pay | Admitting: Cardiovascular Disease

## 2015-10-04 ENCOUNTER — Encounter
Admission: EM | Disposition: A | Discharge status: Home / Self Care | Payer: Self-pay | Source: Home / Self Care | Attending: Internal Medicine

## 2015-10-04 DIAGNOSIS — I214 Non-ST elevation (NSTEMI) myocardial infarction: Principal | ICD-10-CM

## 2015-10-04 DIAGNOSIS — I2510 Atherosclerotic heart disease of native coronary artery without angina pectoris: Secondary | ICD-10-CM

## 2015-10-04 HISTORY — PX: CARDIAC CATHETERIZATION: SHX172

## 2015-10-04 LAB — CBC
HCT: 38.2 % (ref 35.0–47.0)
Hemoglobin: 13 g/dL (ref 12.0–16.0)
MCH: 30.4 pg (ref 26.0–34.0)
MCHC: 33.9 g/dL (ref 32.0–36.0)
MCV: 89.6 fL (ref 80.0–100.0)
PLATELETS: 249 10*3/uL (ref 150–440)
RBC: 4.26 MIL/uL (ref 3.80–5.20)
RDW: 14.7 % — AB (ref 11.5–14.5)
WBC: 8 10*3/uL (ref 3.6–11.0)

## 2015-10-04 LAB — BASIC METABOLIC PANEL
Anion gap: 7 (ref 5–15)
BUN: 15 mg/dL (ref 6–20)
CALCIUM: 9 mg/dL (ref 8.9–10.3)
CHLORIDE: 109 mmol/L (ref 101–111)
CO2: 24 mmol/L (ref 22–32)
CREATININE: 1.03 mg/dL — AB (ref 0.44–1.00)
GFR calc Af Amer: >60 mL/min (ref >60)
GFR, EST NON AFRICAN AMERICAN: 57 mL/min — AB (ref >60)
Glucose, Bld: 92 mg/dL (ref 65–99)
Potassium: 4 mmol/L (ref 3.5–5.1)
SODIUM: 140 mmol/L (ref 135–145)

## 2015-10-04 LAB — TROPONIN I
TROPONIN I: 0.24 ng/mL — AB (ref <0.031)
TROPONIN I: 0.13 ng/mL — AB (ref <0.031)

## 2015-10-04 LAB — HEPARIN LEVEL (UNFRACTIONATED): HEPARIN UNFRACTIONATED: 0.35 [IU]/mL (ref 0.30–0.70)

## 2015-10-04 LAB — HEMOGLOBIN A1C: HEMOGLOBIN A1C: 5.4 % (ref 4.0–6.0)

## 2015-10-04 SURGERY — LEFT HEART CATH AND CORONARY ANGIOGRAPHY
Anesthesia: Moderate Sedation

## 2015-10-04 MED ORDER — SODIUM CHLORIDE 0.9% FLUSH: 3.0000 mL | INTRAVENOUS | Status: DC | PRN

## 2015-10-04 MED ORDER — HEPARIN SODIUM (PORCINE) 1000 UNIT/ML IJ SOLN
INTRAMUSCULAR | Status: DC | PRN
  Administered 2015-10-04: 4000 [IU] via INTRAVENOUS

## 2015-10-04 MED ORDER — ASPIRIN 81 MG PO CHEW: 81.0000 mg | CHEWABLE_TABLET | ORAL | Status: DC

## 2015-10-04 MED ORDER — TICAGRELOR 90 MG PO TABS
90.0000 mg | ORAL_TABLET | Freq: Two times a day (BID) | ORAL | Status: DC
  Administered 2015-10-04 – 2015-10-05 (×2): 90 mg via ORAL
  Filled 2015-10-04 (×2): qty 1

## 2015-10-04 MED ORDER — VERAPAMIL HCL 2.5 MG/ML IV SOLN
INTRAVENOUS | Status: AC
  Filled 2015-10-04: qty 2

## 2015-10-04 MED ORDER — FENTANYL CITRATE (PF) 100 MCG/2ML IJ SOLN
INTRAMUSCULAR | Status: AC
  Filled 2015-10-04: qty 2

## 2015-10-04 MED ORDER — HEPARIN SODIUM (PORCINE) 1000 UNIT/ML IJ SOLN
INTRAMUSCULAR | Status: AC
  Filled 2015-10-04: qty 1

## 2015-10-04 MED ORDER — SODIUM CHLORIDE 0.9 % IV SOLN
INTRAVENOUS | Status: DC
Start: 2015-10-04 — End: 2015-10-04

## 2015-10-04 MED ORDER — SODIUM CHLORIDE 0.9 % IV SOLN
INTRAVENOUS | Status: DC
  Administered 2015-10-04: 11:00:00 via INTRAVENOUS

## 2015-10-04 MED ORDER — ATORVASTATIN CALCIUM 20 MG PO TABS
80.0000 mg | ORAL_TABLET | Freq: Every day | ORAL | Status: DC
  Administered 2015-10-04: 80 mg via ORAL
  Filled 2015-10-04: qty 4

## 2015-10-04 MED ORDER — ASPIRIN 81 MG PO CHEW
81.0000 mg | CHEWABLE_TABLET | Freq: Every day | ORAL | Status: DC
  Administered 2015-10-05: 81 mg via ORAL
  Filled 2015-10-04: qty 1

## 2015-10-04 MED ORDER — TICAGRELOR 90 MG PO TABS
ORAL_TABLET | ORAL | Status: DC | PRN
  Administered 2015-10-04: 180 mg via ORAL

## 2015-10-04 MED ORDER — NITROGLYCERIN 1 MG/10 ML FOR IR/CATH LAB
INTRA_ARTERIAL | Status: DC | PRN
  Administered 2015-10-04: 200 ug via INTRACORONARY

## 2015-10-04 MED ORDER — MIDAZOLAM HCL 2 MG/2ML IJ SOLN
INTRAMUSCULAR | Status: AC
  Filled 2015-10-04: qty 2

## 2015-10-04 MED ORDER — HEPARIN (PORCINE) IN NACL 2-0.9 UNIT/ML-% IJ SOLN
INTRAMUSCULAR | Status: AC
  Filled 2015-10-04: qty 500

## 2015-10-04 MED ORDER — TICAGRELOR 90 MG PO TABS
ORAL_TABLET | ORAL | Status: AC
  Filled 2015-10-04: qty 2

## 2015-10-04 MED ORDER — IOPAMIDOL (ISOVUE-300) INJECTION 61%
INTRAVENOUS | Status: DC | PRN
  Administered 2015-10-04: 150 mL via INTRAVENOUS

## 2015-10-04 MED ORDER — SODIUM CHLORIDE 0.9 % IV SOLN: 250.0000 mL | INTRAVENOUS | Status: DC | PRN

## 2015-10-04 MED ORDER — NITROGLYCERIN 5 MG/ML IV SOLN
INTRAVENOUS | Status: AC
  Filled 2015-10-04: qty 10

## 2015-10-04 MED ORDER — FENTANYL CITRATE (PF) 100 MCG/2ML IJ SOLN
INTRAMUSCULAR | Status: DC | PRN
  Administered 2015-10-04: 25 ug via INTRAVENOUS

## 2015-10-04 MED ORDER — SODIUM CHLORIDE 0.9% FLUSH
3.0000 mL | Freq: Two times a day (BID) | INTRAVENOUS | Status: DC
  Administered 2015-10-04 (×2): 3 mL via INTRAVENOUS

## 2015-10-04 MED ORDER — LISINOPRIL 10 MG PO TABS
5.0000 mg | ORAL_TABLET | Freq: Every day | ORAL | Status: DC
  Administered 2015-10-04 – 2015-10-05 (×2): 5 mg via ORAL
  Filled 2015-10-04 (×2): qty 1

## 2015-10-04 MED ORDER — FUROSEMIDE 10 MG/ML IJ SOLN: 20.0000 mg | Freq: Once | INTRAMUSCULAR | Status: DC

## 2015-10-04 MED ORDER — MIDAZOLAM HCL 2 MG/2ML IJ SOLN
INTRAMUSCULAR | Status: DC | PRN
  Administered 2015-10-04: 1 mg via INTRAVENOUS

## 2015-10-04 MED ORDER — FUROSEMIDE 10 MG/ML IJ SOLN
INTRAMUSCULAR | Status: AC
  Administered 2015-10-04: 13:00:00
  Filled 2015-10-04: qty 2

## 2015-10-04 MED ORDER — SODIUM CHLORIDE 0.9% FLUSH: 3.0000 mL | Freq: Two times a day (BID) | INTRAVENOUS | Status: DC

## 2015-10-04 MED ORDER — VERAPAMIL HCL 2.5 MG/ML IV SOLN
INTRAVENOUS | Status: DC | PRN
  Administered 2015-10-04: 2.5 mg via INTRA_ARTERIAL

## 2015-10-04 MED ORDER — GUAIFENESIN-DM 100-10 MG/5ML PO SYRP
5.0000 mL | ORAL_SOLUTION | ORAL | Status: DC | PRN
Start: 2015-10-04 — End: 2015-10-05
  Administered 2015-10-04: 5 mL via ORAL
  Filled 2015-10-04: qty 5

## 2015-10-04 SURGICAL SUPPLY — 14 items
BALLN TREK RX 2.5X12 (BALLOONS) ×3
BALLOON TREK RX 2.5X12 (BALLOONS) ×1 IMPLANT
CATH HEARTRAIL 6F IL3.5 (CATHETERS) ×3 IMPLANT
CATH INFINITI 5FR ANG PIGTAIL (CATHETERS) ×3 IMPLANT
CATH OPTITORQUE JACKY 4.0 5F (CATHETERS) ×3 IMPLANT
DEVICE INFLAT 30 PLUS (MISCELLANEOUS) ×3 IMPLANT
DEVICE RAD TR BAND REGULAR (VASCULAR PRODUCTS) ×3 IMPLANT
GLIDESHEATH SLEND SS 6F .021 (SHEATH) ×3 IMPLANT
KIT MANI 3VAL PERCEP (MISCELLANEOUS) ×3 IMPLANT
PACK CARDIAC CATH (CUSTOM PROCEDURE TRAY) ×3 IMPLANT
STENT XIENCE ALPINE RX 3.0X18 (Permanent Stent) ×3 IMPLANT
STENT XIENCE ALPINE RX 3.25X15 (Permanent Stent) ×3 IMPLANT
WIRE RUNTHROUGH .014X180CM (WIRE) ×3 IMPLANT
WIRE SAFE-T 1.5MM-J .035X260CM (WIRE) ×3 IMPLANT

## 2015-10-04 NOTE — Care Management Important Message (Signed)
Important Message  Patient Details  Name: [name] MRN: [id number] Date of Birth: [birth year]   Medicare Important Message Given:  Yes    Eber Hong, RN 10/04/2015, 11:55 AM

## 2015-10-04 NOTE — OR Nursing (Signed)
Patient reports feeling SOB, sats 100% lungs with fine crackles in the bases, MD Paged, placed on 02 at 2 liters, fluids decreased to [facility], MD order for lasix 20mg  given

## 2015-10-04 NOTE — Progress Notes (Signed)
Pt's troponin resulted 0.24. Pt already on heparin gtt at therapeutic level. MD Dr. Sheryle Hail notified, no new orders. RN will continue to monitor. Syliva Overman, RN

## 2015-10-04 NOTE — Discharge Instructions (Signed)
Angiogram  An angiogram is an X-ray test. It is used to look at your blood vessels. For this test, a dye is put into the blood vessel being checked. The dye shows up on X-rays. It helps your doctor see if there is a blockage or other problem in the blood vessel.  BEFORE THE PROCEDURE  · Follow your doctor's instructions about limiting what you eat or drink.  · Ask your doctor if you may drink enough water to take any needed medicines the morning of the test.  · Plan to have someone take you home after the test.  · If you go home the same day as the test, plan to have someone stay with you for 24 hours.  PROCEDURE   · An IV tube will be put into one of your veins.  · You will be given a medicine that makes you relax (sedative).  · Your skin will be washed and shaved where the thin tube (catheter) will be inserted. This will usually be done in the upper part of your leg (groin). It may also be done in your arm near the elbow or in your wrist.  · You will be given a medicine that numbs the area where the tube will be inserted (local anesthetic).  · The tube will be inserted into a blood vessel.  · Using a type of X-ray (fluoroscopy) to see, your doctor will move the tube into the blood vessel to check it.  · Dye will be put in through the tube. X-rays of your blood vessels will then be taken.  Different health care providers and hospitals may do this procedure differently.  AFTER THE PROCEDURE   · If the test is done through the leg, you will be kept in bed lying flat for several hours. You will be told to not bend or cross your legs.    · The area where the tube was inserted will be checked often.    · The pulse in your feet or wrist will be checked often.    · More tests or X-rays may be done.       This information is not intended to replace advice given to you by your health care provider. Make sure you discuss any questions you have with your health care provider.     Document Released: 07/14/2008 Document  Revised: 05/08/2014 Document Reviewed: 09/18/2012  Elsevier Interactive Patient Education ©2016 [employer] Inc[employer]

## 2015-10-04 NOTE — Progress Notes (Signed)
[facility] [place] at [facility]                                                                                                                                                                                            Patient Demographics   [name], is a 60 y.o. female, DOB - 1956/03/18, ZOX:[id number]  Admit date - 10/03/2015   Admitting Physician Enid Baas, MD  Outpatient Primary MD for the patient is No PCP Per Patient   LOS - 1  Subjective: Patient admitted with chest pain reports intermittent chest pain ongoing for the past few days. Reports the pain is improved now    Review of Systems:   CONSTITUTIONAL: No documented fever. No fatigue, weakness. No weight gain, no weight loss.  EYES: No blurry or double vision.  ENT: No tinnitus. No postnasal drip. No redness of the oropharynx.  RESPIRATORY: No cough, no wheeze, no hemoptysis. No dyspnea.  CARDIOVASCULAR: Positive chest pain. No orthopnea. No palpitations. No syncope.  GASTROINTESTINAL: No nausea, no vomiting or diarrhea. No abdominal pain. No melena or hematochezia.  GENITOURINARY: No dysuria or hematuria.  ENDOCRINE: No polyuria or nocturia. No heat or cold intolerance.  HEMATOLOGY: No anemia. No bruising. No bleeding.  INTEGUMENTARY: No rashes. No lesions.  MUSCULOSKELETAL: No arthritis. No swelling. No gout.  NEUROLOGIC: No numbness, tingling, or ataxia. No seizure-type activity.  PSYCHIATRIC: No anxiety. No insomnia. No ADD.    Vitals:   Filed Vitals:   10/04/15 1250 10/04/15 1255 10/04/15 1300 10/04/15 1331  BP:    128/84  Pulse: 50 51 51 57  Temp:      TempSrc:      Resp: Height:      Weight:      SpO2: 98% 99% 99% 97%    Wt Readings from Last 3 Encounters:  10/03/15 81.829 kg (180 lb 6.4 oz)  07/26/15 81.647 kg (180 lb)     Intake/Output Summary (Last 24 hours) at 10/04/15 1352 Last data filed at 10/04/15 0800  Gross per 24 hour  Intake       0 ml  Output      0 ml  Net      0 ml    Physical Exam:   GENERAL: Pleasant-appearing in no apparent distress.  HEAD, EYES, EARS, NOSE AND THROAT: Atraumatic, normocephalic. Extraocular muscles are intact. Pupils equal and reactive to light. Sclerae anicteric. No conjunctival injection. No oro-pharyngeal erythema.  NECK: Supple. There is no jugular venous distention. No bruits, no lymphadenopathy, no thyromegaly.  HEART: Regular rate and rhythm,. No murmurs, no  rubs, no clicks.  LUNGS: Clear to auscultation bilaterally. No rales or rhonchi. No wheezes.  ABDOMEN: Soft, flat, nontender, nondistended. Has good bowel sounds. No hepatosplenomegaly appreciated.  EXTREMITIES: No evidence of any cyanosis, clubbing, or peripheral edema.  +2 pedal and radial pulses bilaterally.  NEUROLOGIC: The patient is alert, awake, and oriented x3 with no focal motor or sensory deficits appreciated bilaterally.  SKIN: Moist and warm with no rashes appreciated.  Psych: Not anxious, depressed LN: No inguinal LN enlargement    Antibiotics   Anti-infectives    None      Medications   Scheduled Meds: . [START ON 10/05/2015] aspirin  81 mg Oral Pre-Cath  . aspirin  81 mg Oral Daily  . [MAR Hold] aspirin EC  325 mg Oral Daily  . [MAR Hold] atorvastatin  40 mg Oral q1800  . furosemide  20 mg Intravenous Once  . [MAR Hold] metoprolol tartrate  25 mg Oral BID  . [MAR Hold] nicotine  21 mg Transdermal Daily  . [MAR Hold] nitroGLYCERIN  0.5 inch Topical Q6H  . [MAR Hold] sodium chloride flush  3 mL Intravenous Q12H  . sodium chloride flush  3 mL Intravenous Q12H  . sodium chloride flush  3 mL Intravenous Q12H  . ticagrelor  90 mg Oral BID   Continuous Infusions: . sodium chloride 75 mL/hr at 10/03/15 2122  . sodium chloride 75 mL/hr at 10/04/15 1052  . sodium chloride    . heparin Stopped (10/04/15 0852)   PRN Meds:.sodium chloride, sodium chloride, [MAR Hold] acetaminophen **OR** [MAR Hold]  acetaminophen, [MAR Hold] hydrALAZINE, [MAR Hold]  morphine injection, [MAR Hold] ondansetron **OR** [MAR Hold] ondansetron (ZOFRAN) IV, [MAR Hold] oxyCODONE, sodium chloride flush, sodium chloride flush   Data Review:   Micro Results No results found for this or any previous visit (from the past 240 hour(s)).  Radiology Reports Dg Chest 2 View  10/03/2015  CLINICAL DATA:  Chest pain radiating to neck and jaw beginning yesterday. Nausea. EXAM: CHEST  2 VIEW COMPARISON:  07/26/2015 FINDINGS: The heart size and mediastinal contours are within normal limits. Both lungs are clear. The visualized skeletal structures are unremarkable. IMPRESSION: No active cardiopulmonary disease. Electronically Signed   By: Myles Rosenthal M.D.   On: 10/03/2015 17:56   Dg Finger Middle Right  09/28/2015  CLINICAL DATA:  Reduction right middle finger EXAM: RIGHT MIDDLE FINGER 2+V COMPARISON:  09/28/2015 FINDINGS: Partial reduction of the PIP dorsal dislocation. Small fracture fragments are present around the joint ventrally. Probable ligament damage. IMPRESSION: Partial reduction of PIP joint with small fracture fragments and instability. Electronically Signed   By: Marlan Palau M.D.   On: 09/28/2015 11:54   Dg Finger Middle Right  09/28/2015  CLINICAL DATA:  60 year old who fell out of bed 3 days ago and injured the right long finger, persistent pain, swelling and increasing deformity. EXAM: RIGHT MIDDLE FINGER 2+V COMPARISON:  None. FINDINGS: Posterior dislocation of the PIP joint. No visible fractures. Narrowing of the DIP joint space. Bone mineral density well-preserved. IMPRESSION: Posterior dislocation of the PIP joint without associated fracture. Electronically Signed   By: Hulan Saas M.D.   On: 09/28/2015 10:57     CBC  Recent Labs Lab 10/03/15 1707 10/04/15 0342  WBC 8.2 8.0  HGB 14.6 13.0  HCT 42.8 38.2  PLT 268 249  MCV 89.9 89.6  MCH 30.7 30.4  MCHC 34.1 33.9  RDW 15.1* 14.7*     Chemistries   Recent Labs Lab  10/03/15 1707 10/04/15 0342  NA 139 140  K 4.5 4.0  CL 104 109  CO2 26 24  GLUCOSE 104* 92  BUN 15 15  CREATININE 1.06* 1.03*  CALCIUM 9.6 9.0   ------------------------------------------------------------------------------------------------------------------ estimated creatinine clearance is 59.3 mL/min (by C-G formula based on Cr of 1.03). ------------------------------------------------------------------------------------------------------------------ No results for input(s): HGBA1C in the last 72 hours. ------------------------------------------------------------------------------------------------------------------  Recent Labs  10/03/15 2143  CHOL 284*  HDL 62  LDLCALC 189*  TRIG 166*  CHOLHDL 4.6   ------------------------------------------------------------------------------------------------------------------ No results for input(s): TSH, T4TOTAL, T3FREE, THYROIDAB in the last 72 hours.  Invalid input(s): FREET3 ------------------------------------------------------------------------------------------------------------------ No results for input(s): VITAMINB12, FOLATE, FERRITIN, TIBC, IRON, RETICCTPCT in the last 72 hours.  Coagulation profile  Recent Labs Lab 10/03/15 1707  INR 1.05    No results for input(s): DDIMER in the last 72 hours.  Cardiac Enzymes  Recent Labs Lab 10/03/15 1707 10/03/15 2143 10/04/15 0342  TROPONINI 0.16* 0.18* 0.24*   ------------------------------------------------------------------------------------------------------------------ Invalid input(s): POCBNP    Assessment & Plan   Patient is a 60 year old with chest pain for 2 days  #1 NSTEMI-  Discussed the case with cardiology plan for cardiac catheter later Continue heparin drip Aspirin Lipitor And metoprolol  #2 HTN- continue metoprolol  #3 hyperlipidemia continue Lipitor as above  #4 Tobacco use disorder- counseled  Nicotine patch     Code Status Orders        Start     Ordered   10/03/15 2114  Full code   Continuous     10/03/15 2113    Code Status History    Date Active Date Inactive Code Status Order ID Comments User Context   This patient has a current code status but no historical code status.    Advance Directive Documentation        Most Recent Value   Type of Advance Directive  -- [son]   Pre-existing out of facility DNR order (yellow form or pink MOST form)     "MOST" Form in Place?             Consults Cardiology DVT Prophylaxis  heparin  Lab Results  Component Value Date   PLT 249 10/04/2015     Time Spent in minutes  Greater than 50% of time spent in care coordination and counseling patient regarding the condition and plan of care.   Auburn Bilberry M.D on 10/04/2015 at 1:52 PM  Between 7am to 6pm - Pager - [phone]  After 6pm go to [email or web address] - password [email or web address] [facility]  [facility]   Office  [phone]

## 2015-10-04 NOTE — Consult Note (Signed)
CARDIOLOGY CONSULT NOTE  Patient ID: [name] MRN: [id number] DOB/AGE: 1955-05-05 60 y.o.  Admit date: 10/03/2015 Referring Physician : Dr. Nemiah Commander Primary Physician None Primary Cardiologist new Reason for Consultation : NSTEMI  HPI:  This is a 60 year old female with no previous cardiac history who presented with chest pain and was found to have mildly elevated troponin. She has not seen a physician in many years and is not aware of any medical problems. She does not take any medications at home except for over-the-counter acetaminophen and ibuprofen. She does smoke one pack per day and has been doing so since she was 60 years old. She was also found to have severe hyperlipidemia. On Saturday, she started having sudden onset of substernal chest pain described as aching with no radiation. This lasted for 30 minutes and resolved without intervention. It was described as a funny feeling in her chest with some shortness of breath. She could not elaborate more. She had another episode on Sunday and thus she decided to come to the emergency room. She was found to have mildly elevated troponin with very abnormal EKG which showed deeply inverted T waves in the anterior leads. She does complain of associated dyspnea with no palpitations, syncope or presyncope. No previous similar presentations.   A 10 point review of system was performed. It is negative other than that mentioned in the history of present illness.   History reviewed. No pertinent past medical history.  Family History  Problem Relation Age of Onset  . CAD Mother   . Colon cancer Mother     Social History   Social History  . Marital Status: Divorced    Spouse Name: N/A  . Number of Children: N/A  . Years of Education: N/A   Occupational History  . Not on file.   Social History Main Topics  . Smoking status: Current Every Day Smoker -- 1.00 packs/day    Types: Cigarettes  . Smokeless tobacco: Not on file   Comment: for 50 years now  . Alcohol Use: No  . Drug Use: No  . Sexual Activity: Not on file   Other Topics Concern  . Not on file   Social History Narrative   Lives at home with son and daughter-in-law. Very independent and active at baseline.    History reviewed. No pertinent past surgical history.   Prescriptions prior to admission  Medication Sig Dispense Refill Last Dose  . albuterol (PROVENTIL HFA;VENTOLIN HFA) 108 (90 Base) MCG/ACT inhaler Inhale 2 puffs into the lungs every 6 (six) hours as needed for wheezing or shortness of breath. 1 Inhaler 2 Past Month at Unknown time  . chlorpheniramine-HYDROcodone (TUSSIONEX PENNKINETIC ER) 10-8 MG/5ML SUER Take 5 mLs by mouth every 12 (twelve) hours as needed for cough. 100 mL 0 Past Month at Unknown time  . HYDROcodone-acetaminophen (NORCO/VICODIN) 5-325 MG tablet Take 1 tablet by mouth every 4 (four) hours as needed for moderate pain. 20 tablet 0 09/26/2015    Physical Exam: Blood pressure 129/77, pulse 65, temperature 97.6 F (36.4 C), temperature source Oral, resp. rate 18, height  (1.626 m), weight 180 lb 6.4 oz (81.829 kg), SpO2 97 %.   Constitutional:  oriented to person, place, and time. He appears well-developed and well-nourished. No distress.  HENT: No nasal discharge.  Head: Normocephalic and atraumatic.  Eyes: Pupils are equal and round.  No discharge. Neck: Normal range of motion. Neck supple. No JVD present. No thyromegaly present.  Cardiovascular: Normal  rate, regular rhythm, normal heart sounds. Exam reveals no gallop and no friction rub. No murmur heard.  Pulmonary/Chest: Effort normal and breath sounds normal. No stridor. No respiratory distress.  no wheezes or rales.   Abdominal: Soft. Bowel sounds are normal. He exhibits no distension. There is no tenderness. There is no rebound and no guarding.  Musculoskeletal: Normal range of motion. No edema and no tenderness.  Neurological: Alert and oriented to person,  place, and time. Coordination normal.  Skin: Skin is warm and dry. No rash noted. He is not diaphoretic. No erythema. No pallor.  Psychiatric: Normal mood and affect.  behavior is normal. Judgment and thought content normal.     Labs:   Lab Results  Component Value Date   WBC 8.0 10/04/2015   HGB 13.0 10/04/2015   HCT 38.2 10/04/2015   MCV 89.6 10/04/2015   PLT 249 10/04/2015    Recent Labs Lab 10/04/15 0342  NA 140  K 4.0  CL 109  CO2 24  BUN 15  CREATININE 1.03*  CALCIUM 9.0  GLUCOSE 92   Lab Results  Component Value Date   TROPONINI 0.24* 10/04/2015      Radiology:  Chest x-ray was unremarkable EKG: Personally reviewed by me and showed normal sinus rhythm with deeply inverted T waves in the anterior and anterolateral leads suggestive of LAD ischemia.  ASSESSMENT AND PLAN:     1. Non-ST elevation myocardial infarction: Highly abnormal EKG. I agree with current management including aspirin, unfractionated heparin, a beta blocker and high-dose statin. I recommend proceeding with cardiac catheterization and possible coronary intervention. I discussed risks and benefits with the patient I also called the daughter and updated her about the situation. Further recommendations to follow after cardiac catheterization.  2. Hyperlipidemia: LDL was severely elevated. Recommend high dose atorvastatin.  3. Tobacco use: Will need to counsel the patient about smoking cessation.  Signed:  Lorine Bears MD, [facility] 10/04/2015, 8:36 AM

## 2015-10-04 NOTE — CV Procedure (Signed)
Cardiac cath showed 90% proximal and 80% mid LAD stenosis s/p PCI and DES X 2 to both locations.  Possible discharge tomorrow.  Full report to follow.

## 2015-10-05 ENCOUNTER — Inpatient Hospital Stay (HOSPITAL_COMMUNITY)
Admit: 2015-10-05 | Discharge: 2015-10-05 | Disposition: A | Discharge status: Home / Self Care | Payer: Medicare Other | Attending: Cardiovascular Disease | Admitting: Cardiovascular Disease

## 2015-10-05 ENCOUNTER — Telehealth: Payer: Self-pay | Admitting: Cardiovascular Disease

## 2015-10-05 DIAGNOSIS — R079 Chest pain, unspecified: Secondary | ICD-10-CM

## 2015-10-05 LAB — ECHOCARDIOGRAM COMPLETE
HEIGHTINCHES: 64 in
WEIGHTICAEL: 2886.4 [oz_av]

## 2015-10-05 LAB — CBC
HCT: 39.7 % (ref 35.0–47.0)
HEMOGLOBIN: 13.2 g/dL (ref 12.0–16.0)
MCH: 30 pg (ref 26.0–34.0)
MCHC: 33.3 g/dL (ref 32.0–36.0)
MCV: 89.9 fL (ref 80.0–100.0)
PLATELETS: 228 10*3/uL (ref 150–440)
RBC: 4.41 MIL/uL (ref 3.80–5.20)
RDW: 14.7 % — AB (ref 11.5–14.5)
WBC: 8.2 10*3/uL (ref 3.6–11.0)

## 2015-10-05 LAB — BASIC METABOLIC PANEL
ANION GAP: 9 (ref 5–15)
BUN: 17 mg/dL (ref 6–20)
CALCIUM: 9.1 mg/dL (ref 8.9–10.3)
CO2: 26 mmol/L (ref 22–32)
CREATININE: 1.16 mg/dL — AB (ref 0.44–1.00)
Chloride: 104 mmol/L (ref 101–111)
GFR, EST AFRICAN AMERICAN: 58 mL/min — AB (ref >60)
GFR, EST NON AFRICAN AMERICAN: 50 mL/min — AB (ref >60)
Glucose, Bld: 85 mg/dL (ref 65–99)
Potassium: 3.9 mmol/L (ref 3.5–5.1)
SODIUM: 139 mmol/L (ref 135–145)

## 2015-10-05 MED ORDER — LISINOPRIL 5 MG PO TABS: 5.0000 mg | ORAL_TABLET | Freq: Every day | ORAL | Status: DC

## 2015-10-05 MED ORDER — NICOTINE 21 MG/24HR TD PT24: 21.0000 mg | MEDICATED_PATCH | Freq: Every day | TRANSDERMAL | Status: DC

## 2015-10-05 MED ORDER — METOPROLOL TARTRATE 25 MG PO TABS: 25.0000 mg | ORAL_TABLET | Freq: Two times a day (BID) | ORAL | Status: DC

## 2015-10-05 MED ORDER — TICAGRELOR 90 MG PO TABS: 90.0000 mg | ORAL_TABLET | Freq: Two times a day (BID) | ORAL | Status: DC

## 2015-10-05 MED ORDER — ATORVASTATIN CALCIUM 20 MG PO TABS: 20.0000 mg | ORAL_TABLET | Freq: Every day | ORAL | Status: DC

## 2015-10-05 MED ORDER — ASPIRIN 81 MG PO CHEW: 81.0000 mg | CHEWABLE_TABLET | Freq: Every day | ORAL | Status: AC

## 2015-10-05 NOTE — Discharge Summary (Signed)
[name], 60 y.o., DOB 12-18-1955, MRN [id number]. Admission date: 10/03/2015 Discharge Date 10/05/2015 Primary MD No PCP Per Patient Admitting Physician Enid Baas, MD  Admission Diagnosis  Acute coronary syndrome [facility]) [I24.9]  Discharge Diagnosis   Active Problems:   NSTEMI (non-ST elevated myocardial infarction) (HCC)   Hypertension  Hyperlipidemia  Tobacco abuse  Systolic dysfunction          Hospital Course [name] is a 60 y.o. female with No known past medical history, hasn't seen a physician in several years presents to the hospital secondary to ongoing chest pain for 2 days. Patient was seen in the emergency room and had elevated troponin and therefore admitted to the hospital. She was started on a heparin drip subsequent cardiology consult was obtained. She underwent a cardiac catheter which showed 90% proximal and 80% mid LAD stenosis. She underwent PCI and BS 2 to both locations. Patient is doing much better and is asymptomatic and stable for discharge.            Consults  cardiology  Significant Tests:  See full reports for all details      Dg Chest 2 View  10/03/2015  CLINICAL DATA:  Chest pain radiating to neck and jaw beginning yesterday. Nausea. EXAM: CHEST  2 VIEW COMPARISON:  07/26/2015 FINDINGS: The heart size and mediastinal contours are within normal limits. Both lungs are clear. The visualized skeletal structures are unremarkable. IMPRESSION: No active cardiopulmonary disease. Electronically Signed   By: Myles Rosenthal M.D.   On: 10/03/2015 17:56   Dg Finger Middle Right  09/28/2015  CLINICAL DATA:  Reduction right middle finger EXAM: RIGHT MIDDLE FINGER 2+V COMPARISON:  09/28/2015 FINDINGS: Partial reduction of the PIP dorsal dislocation. Small fracture fragments are present around the joint ventrally. Probable ligament damage. IMPRESSION: Partial reduction of PIP joint with small fracture fragments and instability. Electronically  Signed   By: Marlan Palau M.D.   On: 09/28/2015 11:54   Dg Finger Middle Right  09/28/2015  CLINICAL DATA:  60 year old who fell out of bed 3 days ago and injured the right long finger, persistent pain, swelling and increasing deformity. EXAM: RIGHT MIDDLE FINGER 2+V COMPARISON:  None. FINDINGS: Posterior dislocation of the PIP joint. No visible fractures. Narrowing of the DIP joint space. Bone mineral density well-preserved. IMPRESSION: Posterior dislocation of the PIP joint without associated fracture. Electronically Signed   By: Hulan Saas M.D.   On: 09/28/2015 10:57       Today   Subjective:   [name]  Feels better no chest pain  Objective:   Blood pressure 101/57, pulse 66, temperature 97.8 F (36.6 C), temperature source Oral, resp. rate 16, height  (1.626 m), weight 81.829 kg (180 lb 6.4 oz), SpO2 98 %.  .  Intake/Output Summary (Last 24 hours) at 10/05/15 1244 Last data filed at 10/04/15 1958  Gross per 24 hour  Intake 653.53 ml  Output      0 ml  Net 653.53 ml    Exam VITAL SIGNS: Blood pressure 101/57, pulse 66, temperature 97.8 F (36.6 C), temperature source Oral, resp. rate 16, height  (1.626 m), weight 81.829 kg (180 lb 6.4 oz), SpO2 98 %.  GENERAL:  60 y.o.-year-old patient lying in the bed with no acute distress.  EYES: Pupils equal, round, reactive to light and accommodation. No scleral icterus. Extraocular muscles intact.  HEENT: Head atraumatic, normocephalic. Oropharynx and nasopharynx clear.  NECK:  Supple, no jugular venous distention. No thyroid  enlargement, no tenderness.  LUNGS: Normal breath sounds bilaterally, no wheezing, rales,rhonchi or crepitation. No use of accessory muscles of respiration.  CARDIOVASCULAR: S1, S2 normal. No murmurs, rubs, or gallops.  ABDOMEN: Soft, nontender, nondistended. Bowel sounds present. No organomegaly or mass.  EXTREMITIES: No pedal edema, cyanosis, or clubbing.  NEUROLOGIC: Cranial nerves  II through XII are intact. Muscle strength 5/5 in all extremities. Sensation intact. Gait not checked.  PSYCHIATRIC: The patient is alert and oriented x 3.  SKIN: No obvious rash, lesion, or ulcer.   Data Review     CBC w Diff: Lab Results  Component Value Date   WBC 8.2 10/05/2015   HGB 13.2 10/05/2015   HCT 39.7 10/05/2015   PLT 228 10/05/2015   CMP: Lab Results  Component Value Date   NA 139 10/05/2015   K 3.9 10/05/2015   CL 104 10/05/2015   CO2 26 10/05/2015   BUN 17 10/05/2015   CREATININE 1.16* 10/05/2015  .  Micro Results No results found for this or any previous visit (from the past 240 hour(s)).      Code Status Orders        Start     Ordered   10/03/15 2114  Full code   Continuous     10/03/15 2113    Code Status History    Date Active Date Inactive Code Status Order ID Comments User Context   This patient has a current code status but no historical code status.    Advance Directive Documentation        Most Recent Value   Type of Advance Directive  -- [son]   Pre-existing out of facility DNR order (yellow form or pink MOST form)     "MOST" Form in Place?            Follow-up Information    Follow up with Lorine Bears, MD On 10/26/2015.   Specialty:  Cardiology   Why:  at 1:30pm with PA Eula Listen.    Contact information:   [address] [place] [address] [phone]       Discharge Medications     Medication List    TAKE these medications        albuterol 108 (90 Base) MCG/ACT inhaler  Commonly known as:  PROVENTIL HFA;VENTOLIN HFA  Inhale 2 puffs into the lungs every 6 (six) hours as needed for wheezing or shortness of breath.     aspirin 81 MG chewable tablet  Chew 1 tablet (81 mg total) by mouth daily.     atorvastatin 20 MG tablet  Commonly known as:  LIPITOR  Take 1 tablet (20 mg total) by mouth daily.     chlorpheniramine-HYDROcodone 10-8 MG/5ML Suer  Commonly known as:  TUSSIONEX PENNKINETIC  ER  Take 5 mLs by mouth every 12 (twelve) hours as needed for cough.     HYDROcodone-acetaminophen 5-325 MG tablet  Commonly known as:  NORCO/VICODIN  Take 1 tablet by mouth every 4 (four) hours as needed for moderate pain.     lisinopril 5 MG tablet  Commonly known as:  PRINIVIL,ZESTRIL  Take 1 tablet (5 mg total) by mouth daily.     metoprolol tartrate 25 MG tablet  Commonly known as:  LOPRESSOR  Take 1 tablet (25 mg total) by mouth 2 (two) times daily.     nicotine 21 mg/24hr patch  Commonly known as:  NICODERM CQ - dosed in mg/24 hours  Place 1 patch (21 mg  total) onto the skin daily.     ticagrelor 90 MG Tabs tablet  Commonly known as:  BRILINTA  Take 1 tablet (90 mg total) by mouth 2 (two) times daily.           Total Time in preparing paper work, data evaluation and todays exam - 35 minutes  Auburn Bilberry M.D on 10/05/2015 at 12:44 PM  [facility]   Office  [phone]

## 2015-10-05 NOTE — Progress Notes (Signed)
Patient received discharge instructions, pt verbalized understanding. IV was removed with no signs of infection. Dressing clean, dry intact. No skin tears or wounds present. Prescription was faxed to pharmacy of choice. Patient received Brilinta coupon. Radial site is clean dry and intact. No signs of bleeding or bruising. Patient was escorted out with staff member via wheelchair via private auto. No further needs from care management team.

## 2015-10-05 NOTE — Care Management Note (Signed)
Case Management Note  Patient Details  Name: [name] MRN: [id number] Date of Birth: 12/16/1955  Subjective/Objective:   Met with patient and her daughter in law. Patient states she lives at home with her son and daughter in law. She is independent, active and drives. Patient to be discharged on Brillinta. Coupon given. No home health or DME needed. List of accepting PCP given since patient has no PCP. Strongly encouraged her to get established with a PCP and the benefits. She has Medicare Part D coverage.                   Action/Plan: Brilinta coupon given.   Expected Discharge Date:    10/05/2015              Expected Discharge Plan:  Home/Self Care  In-House Referral:     Discharge planning Services  CM Consult  Post Acute Care Choice:    Choice offered to:     DME Arranged:    DME Agency:     HH Arranged:    HH Agency:     Status of Service:  Completed, signed off  Medicare Important Message Given:  Yes Date Medicare IM Given:    Medicare IM give by:    Date Additional Medicare IM Given:    Additional Medicare Important Message give by:     If discussed at Long Length of Stay Meetings, dates discussed:    Additional Comments:  Lisa M Jacobs, RN 10/05/2015, 9:35 AM  

## 2015-10-05 NOTE — Progress Notes (Signed)
*  PRELIMINARY RESULTS* Echocardiogram 2D Echocardiogram has been performed.  [name] 10/05/2015, 9:44 AM

## 2015-10-05 NOTE — Progress Notes (Signed)
Patient Name: [name] Date of Encounter: 10/05/2015  Patient Care Team: No Pcp Per Patient as PCP - General (General Practice)  PROBLEM LIST  Active Problems:   NSTEMI (non-ST elevated myocardial infarction) (HCC)     SUBJECTIVE  No chest pain, shortness of breath. Patient doing very well. She is highly motivated to stop smoking.  CURRENT MEDS . aspirin  81 mg Oral Daily  . atorvastatin  80 mg Oral q1800  . furosemide  20 mg Intravenous Once  . lisinopril  5 mg Oral Daily  . metoprolol tartrate  25 mg Oral BID  . nicotine  21 mg Transdermal Daily  . nitroGLYCERIN  0.5 inch Topical Q6H  . sodium chloride flush  3 mL Intravenous Q12H  . sodium chloride flush  3 mL Intravenous Q12H  . ticagrelor  90 mg Oral BID    OBJECTIVE  Filed Vitals:   10/04/15 2001 10/04/15 2131 10/05/15 0506 10/05/15 0908  BP: 114/65  110/73 101/57  Pulse: 58 62 66 66  Temp: 97.7 F (36.5 C)  97.8 F (36.6 C)   TempSrc: Oral  Oral   Resp: 16  16   Height:      Weight:      SpO2: 100%  97% 98%    Intake/Output Summary (Last 24 hours) at 10/05/15 0949 Last data filed at 10/04/15 1958  Gross per 24 hour  Intake 653.53 ml  Output      0 ml  Net 653.53 ml   Filed Weights   10/03/15 1646 10/03/15 2119  Weight: 180 lb (81.647 kg) 180 lb 6.4 oz (81.829 kg)    PHYSICAL EXAM: VS:  BP 101/57 mmHg  Pulse 66  Temp(Src) 97.8 F (36.6 C) (Oral)  Resp 16  Ht 5\' 4"  (1.626 m)  Wt 180 lb 6.4 oz (81.829 kg)  BMI 30.95 kg/m2  SpO2 98% , BMI Body mass index is 30.95 kg/(m^2). GENERAL:  well developed, well nourished, obese, not in acute distress HEENT: normocephalic, pink conjunctivae, anicteric sclerae, no xanthelasma, normal dentition, oropharynx clear NECK:  no neck vein engorgement, JVP normal, no hepatojugular reflux, carotid upstroke brisk and symmetric, no bruit, no thyromegaly, no lymphadenopathy LUNGS:  good respiratory effort, clear to auscultation bilaterally CV:  PMI not  displaced, no thrills, no lifts, S1 and S2 within normal limits, no palpable S3 or S4, no murmurs, no rubs, no gallops. Bilateral radial pulse intact, no bruit on right wrist ABD:  Soft, nontender, nondistended, normoactive bowel sounds, no abdominal aortic bruit, no hepatomegaly, no splenomegaly MS: nontender back, no kyphosis, no scoliosis, no joint deformities EXT:  2+ DP/PT pulses, no edema, no varicosities, no cyanosis, no clubbing SKIN: warm, nondiaphoretic, normal turgor, no ulcers NEUROPSYCH: alert, oriented to person, place, and time, sensory/motor grossly intact, normal mood, appropriate affect   Accessory Clinical Findings  CBC  Recent Labs  10/04/15 0342 10/05/15 0524  WBC 8.0 8.2  HGB 13.0 13.2  HCT 38.2 39.7  MCV 89.6 89.9  PLT 249 228   Basic Metabolic Panel  Recent Labs  10/04/15 0342 10/05/15 0524  NA 140 139  K 4.0 3.9  CL 109 104  CO2 24 26  GLUCOSE 92 85  BUN 15 17  CREATININE 1.03* 1.16*  CALCIUM 9.0 9.1   Liver Function Tests No results for input(s): AST, ALT, ALKPHOS, BILITOT, PROT, ALBUMIN in the last 72 hours. No results for input(s): LIPASE, AMYLASE in the last 72 hours. Cardiac Enzymes  Recent Labs  10/03/15  2143 10/04/15 0342 10/04/15 1417  TROPONINI 0.18* 0.24* 0.13*   BNP (last 3 results) No results for input(s): BNP in the last 8760 hours. D-Dimer No results for input(s): DDIMER in the last 72 hours. Hemoglobin A1C  Recent Labs  10/03/15 2143  HGBA1C 5.4   Fasting Lipid Panel  Recent Labs  10/03/15 2143  CHOL 284*  HDL 62  LDLCALC 189*  TRIG 166*  CHOLHDL 4.6   Thyroid Function Tests No results for input(s): TSH, T4TOTAL, T3FREE, THYROIDAB in the last 72 hours.  Invalid input(s): FREET3  TELE Sinus rhythm  RADIOLOGY/STUDIES  Dg Chest 2 View  10/03/2015  CLINICAL DATA:  Chest pain radiating to neck and jaw beginning yesterday. Nausea. EXAM: CHEST  2 VIEW COMPARISON:  07/26/2015 FINDINGS: The heart size and  mediastinal contours are within normal limits. Both lungs are clear. The visualized skeletal structures are unremarkable. IMPRESSION: No active cardiopulmonary disease. Electronically Signed   By: Myles Rosenthal M.D.   On: 10/03/2015 17:56   Dg Finger Middle Right  09/28/2015  CLINICAL DATA:  Reduction right middle finger EXAM: RIGHT MIDDLE FINGER 2+V COMPARISON:  09/28/2015 FINDINGS: Partial reduction of the PIP dorsal dislocation. Small fracture fragments are present around the joint ventrally. Probable ligament damage. IMPRESSION: Partial reduction of PIP joint with small fracture fragments and instability. Electronically Signed   By: Marlan Palau M.D.   On: 09/28/2015 11:54   Dg Finger Middle Right  09/28/2015  CLINICAL DATA:  60 year old who fell out of bed 3 days ago and injured the right long finger, persistent pain, swelling and increasing deformity. EXAM: RIGHT MIDDLE FINGER 2+V COMPARISON:  None. FINDINGS: Posterior dislocation of the PIP joint. No visible fractures. Narrowing of the DIP joint space. Bone mineral density well-preserved. IMPRESSION: Posterior dislocation of the PIP joint without associated fracture. Electronically Signed   By: Hulan Saas M.D.   On: 09/28/2015 10:57   Cath 10/04/2015:  Prox Cx to Mid Cx lesion, 30% stenosed.  Mid RCA lesion, 30% stenosed.  There is moderate to severe left ventricular systolic dysfunction.  Mid LAD to Dist LAD lesion, 80% stenosed. Post intervention, there is a 0% residual stenosis.  Ost LAD to Prox LAD lesion, 90% stenosed. Post intervention, there is a 0% residual stenosis.  1. Severe one-vessel coronary artery disease affecting mid and proximal/ostial LAD. Otherwise mild disease in the left circumflex and right coronary artery. 2. Moderately to severely reduced LV systolic function with an ejection fraction of 30-35% with severe hypokinesis of mid to distal anterior, apical and distal inferior walls. 3. Moderately elevated left  ventricular end-diastolic pressure 4. Successful angioplasty and drug-eluting stent placement to the mid as well as proximal LAD.  Recommendations: Dual antiplatelet therapy for at least one year. Aggressive treatment of risk factors, treatment of cardiomyopathy, smoking cessation and cardiac rehabilitation.  Echo 10/05/2015: - Procedure narrative: Transthoracic echocardiography. The study  was technically difficult. - Left ventricle: The cavity size was normal. Wall thickness was  increased in the posterior segment. Systolic function was at the  lower limits of normal. The estimated ejection fraction was 50%.  There is mild hypokinesis of the anteroseptal and anterior  myocardium. Left ventricular diastolic function parameters were  normal for the patient&'s age.   ASSESSMENT AND PLAN 1. Non-ST elevation myocardial infarction S/P PCI to os and mid LAD No note of complications post heart catheterization Rec dual antiplatelet therapy. Cont BB, ACE, statin. Tobacco cessation. ffup in cardiology office next week. Echo today shows  improvement in EF with mild hypokinesis of the anterior and anteroseptal walls. EF 50  2. Hyperlipidemia: LDL goal < 70. Cont statin therapy.  3. Tobacco use: smoking cessation recommended.  Lifestyle changes recommended. Patient verbalized understanding and agreed with plan.  Signed, Almond Lint, MD  10/05/2015, 9:49 AM

## 2015-10-05 NOTE — Telephone Encounter (Signed)
TCM..the patient being discharged today  Saw Dr Kirke Corin in hospital.  Pt is coming 10/26/15 to see Eula Listen. Pt is on wait list.

## 2015-10-26 ENCOUNTER — Encounter: Payer: Self-pay | Admitting: Physician Assistant

## 2015-10-26 ENCOUNTER — Ambulatory Visit (INDEPENDENT_AMBULATORY_CARE_PROVIDER_SITE_OTHER): Payer: Medicare Other | Admitting: Physician Assistant

## 2015-10-26 VITALS — BP 120/70 | HR 54 | Ht 64.0 in | Wt 177.8 lb

## 2015-10-26 DIAGNOSIS — I2510 Atherosclerotic heart disease of native coronary artery without angina pectoris: Secondary | ICD-10-CM | POA: Diagnosis not present

## 2015-10-26 DIAGNOSIS — Z720 Tobacco use: Secondary | ICD-10-CM | POA: Diagnosis not present

## 2015-10-26 DIAGNOSIS — Z9861 Coronary angioplasty status: Secondary | ICD-10-CM

## 2015-10-26 DIAGNOSIS — E785 Hyperlipidemia, unspecified: Secondary | ICD-10-CM

## 2015-10-26 DIAGNOSIS — I255 Ischemic cardiomyopathy: Secondary | ICD-10-CM | POA: Diagnosis not present

## 2015-10-26 DIAGNOSIS — F419 Anxiety disorder, unspecified: Secondary | ICD-10-CM

## 2015-10-26 DIAGNOSIS — F329 Major depressive disorder, single episode, unspecified: Secondary | ICD-10-CM | POA: Insufficient documentation

## 2015-10-26 DIAGNOSIS — Z955 Presence of coronary angioplasty implant and graft: Secondary | ICD-10-CM

## 2015-10-26 MED ORDER — ALPRAZOLAM 0.25 MG PO TABS: 0.2500 mg | ORAL_TABLET | Freq: Every evening | ORAL | Status: DC | PRN

## 2015-10-26 MED ORDER — ALPRAZOLAM 0.25 MG PO TABS: 0.2500 mg | ORAL_TABLET | Freq: Every day | ORAL | Status: DC | PRN

## 2015-10-26 MED ORDER — METOPROLOL TARTRATE 25 MG PO TABS: 12.5000 mg | ORAL_TABLET | Freq: Two times a day (BID) | ORAL | Status: DC

## 2015-10-26 NOTE — Progress Notes (Signed)
Cardiology Office Note Date:  10/26/2015  Patient ID:  [name], DOB 06/28/1955, MRN [id number] PCP:  No PCP Per Patient  Cardiologist:  Dr. Kirke Corin, MD    Chief Complaint: Hospital follow up  History of Present Illness: [name] is a 60 y.o. female with history of recently diagnosed CAD s/p NSTEMI in early June 2017 s/p PCI/DES x 2 as below to the LAD, recently diagnosed ICM with EF of 50% by echo June 2017, HLD, and tobacco abuse at 1ppd since age 42 who presents for hospital follow up for admission 6/4-6/6 for the above recent NSTEMI.   Prior to the above admission she did not have any previously known cardiac history. She had not seen a physician in the outpatient setting in many years and she was unaware of any medical problems upon her admission. Her only medications at time of admission were prn Tylenol and ibuprofen. She presented to [facility] on 6/4 with chest pain and was found to have a troponin that peaked at 0.24. EKG showed deeply inverted T waves in the anterior leads. She underwent cardiac cath on 6/5 that showed left main normal, ostial to proximal LAD was 90% stenosed s/p PCI/DES with Xience Alpine stent 3/23 x 15 mm with TIMI 3 flow pre-intervention and TIMI 3 flow post-intervention. Mid LAD was 80% stenosed s/p PCI/DES with Xience Alpine stent 3.0 x 18 mm with TIMI 3 flow pre-intervention and TIMI 3 flow post-intervention. D1,2,3 were angiogrpahically normal. Proximal to mid LCx 30% stenosed, OM1 and 2 with minimal irregs, OM3 normal, mid RCA 30% stenosed. LV gram showed moderately to severely reduced LVEF with an EF of 30-35% with severe hypokinesis of the mid to distal anterior, apical, and inferior walls. Moderately elevated LVEDP. Echo on 10/05/15 showed an LVEF of 50%, mild hypokinesis of the anteroseptal and anterior myocardium, LV diastolic funciton was normal. She was placed on aspirin 81 mg and Brilinta 90 mg bid for at least 12 months. LDL of 189-->Lipitor at  discharge. A1C 5.4%.   Since her discharge, she has not had any issues with chest pain or SOB. She is taking all medications as directed and has not missed any doses. She has cut back from a max of 2.5 packs of cigarettes daily to 2 cigarettes daily at this time. She drinks 2 beers weekly which is a decrease from 2 beers daily. Her weight is improving with diet changes. Only eating baked foods. Rarely eats at restaurants. She continues to use sea salt. She has not yet started cardiac rehab.   Her biggest issue today is anxiety and depression. She asks why did she go from not taking any medications for her entire life and is now taken numerous heart medications. She feels depressed regarding her diagnosis of CAD/NSTEMI. She is tearful at times. No thoughts of SI/HI.    Past Medical History  Diagnosis Date  . CAD (coronary artery disease)     a. NSTEMI 10/03/15; b. cardiac cath 10/04/15: LM nl, ost-pLAD 90% s/p PCI/DES, mLAD 80% s/p PCI/DES, mRCA 30%, D1,2,3 nl, p-mLCX 30%, OM1 &2 min irregs, OM3 normal, EF 30-35%, sev HK of mid-distal ant, apical, inf wall, mod elevated LVEDP  . Ischemic cardiomyopathy     a. echo 10/05/15: EF 50%, mild HK of anteroseptal and anterior wall, nl LV diastolic fxn  . HLD (hyperlipidemia)   . Tobacco abuse     Past Surgical History  Procedure Laterality Date  . Cardiac catheterization N/A 10/04/2015  Procedure: Left Heart Cath and Coronary Angiography;  Surgeon: Iran Ouch, MD;  Location: [facility] INVASIVE CV LAB;  Service: Cardiovascular;  Laterality: N/A;  . Cardiac catheterization N/A 10/04/2015    Procedure: Coronary Stent Intervention;  Surgeon: Iran Ouch, MD;  Location: [facility] INVASIVE CV LAB;  Service: Cardiovascular;  Laterality: N/A;    Current Outpatient Prescriptions  Medication Sig Dispense Refill  . albuterol (PROVENTIL HFA;VENTOLIN HFA) 108 (90 Base) MCG/ACT inhaler Inhale 2 puffs into the lungs every 6 (six) hours as needed for wheezing or  shortness of breath. 1 Inhaler 2  . aspirin 81 MG chewable tablet Chew 1 tablet (81 mg total) by mouth daily.    [name] atorvastatin (LIPITOR) 20 MG tablet Take 1 tablet (20 mg total) by mouth daily. 30 tablet 0  . lisinopril (PRINIVIL,ZESTRIL) 5 MG tablet Take 1 tablet (5 mg total) by mouth daily. 30 tablet 0  . metoprolol tartrate (LOPRESSOR) 25 MG tablet Take 0.5 tablets (12.5 mg total) by mouth 2 (two) times daily. 60 tablet 0  . nicotine (NICODERM CQ - DOSED IN MG/24 HOURS) 21 mg/24hr patch Place 1 patch (21 mg total) onto the skin daily. 28 patch 0  . ticagrelor (BRILINTA) 90 MG TABS tablet Take 1 tablet (90 mg total) by mouth 2 (two) times daily. 60 tablet 3  . ALPRAZolam (XANAX) 0.25 MG tablet Take 1 tablet (0.25 mg total) by mouth daily as needed for anxiety. 30 tablet 0   No current facility-administered medications for this visit.    Allergies:   Penicillins   Social History:  The patient  reports that she has been smoking Cigarettes.  She has been smoking about 1.00 pack per day. She does not have any smokeless tobacco history on file. She reports that she drinks about 1.2 oz of alcohol per week. She reports that she does not use illicit drugs.   Family History:  The patient's family history includes CAD in her mother; Colon cancer in her mother.  ROS:   Review of Systems  Constitutional: Positive for weight loss and malaise/fatigue. Negative for fever, chills and diaphoresis.  HENT: Negative for congestion.   Eyes: Negative for discharge and redness.  Respiratory: Negative for cough, sputum production, shortness of breath and wheezing.   Cardiovascular: Negative for chest pain, palpitations, orthopnea, claudication, leg swelling and PND.  Gastrointestinal: Negative for nausea, vomiting and abdominal pain.  Musculoskeletal: Positive for myalgias. Negative for falls.       Bilateral upper extremity myalgias   Skin: Negative for rash.  Neurological: Positive for headaches. Negative  for dizziness, tingling, tremors, sensory change, speech change, focal weakness, loss of consciousness and weakness.  Endo/Heme/Allergies: Does not bruise/bleed easily.  Psychiatric/Behavioral: Positive for substance abuse. The patient is nervous/anxious.        Ongoing tobacco abuse. Rare ETOH usage  All other systems reviewed and are negative.    PHYSICAL EXAM:  VS:  BP 120/70 mmHg  Pulse 54  Ht 5\' 4"  (1.626 m)  Wt 177 lb 12.8 oz (80.65 kg)  BMI 30.50 kg/m2 BMI: Body mass index is 30.5 kg/(m^2).  Physical Exam  Constitutional: She is oriented to person, place, and time. She appears well-developed and well-nourished. No distress.  HENT:  Head: Normocephalic and atraumatic.  Eyes: Pupils are equal, round, and reactive to light.  Neck: Normal range of motion. No JVD present.  Cardiovascular: Normal rate, regular rhythm, S1 normal, S2 normal and normal heart sounds.  Exam reveals no S3, no S4,  no distant heart sounds, no friction rub and no midsystolic click.   No murmur heard. Pulses:      Dorsalis pedis pulses are 2+ on the right side, and 2+ on the left side.       Posterior tibial pulses are 2+ on the right side, and 2+ on the left side.  Right radial cath site well healed without bleeding, bruising, erythema, swelling, or TTP. Distal pulse 2+.   Pulmonary/Chest: Effort normal and breath sounds normal. No respiratory distress. She has no wheezes. She has no rales. She exhibits no tenderness.  Abdominal: Soft.  Musculoskeletal: Normal range of motion.  Neurological: She is alert and oriented to person, place, and time.  Skin: Skin is warm and dry. She is not diaphoretic.  Psychiatric: Her speech is normal and behavior is normal. Judgment and thought content normal. Her mood appears anxious. Her affect is not angry, not blunt, not labile and not inappropriate. Cognition and memory are normal. She exhibits a depressed mood.     EKG:  Was ordered and interpreted by me today. Shows  sinus bradycardia, 54 bpm, TWI I, V2, V4, V5, V6  Recent Labs: 10/05/2015: BUN 17; Creatinine, Ser 1.16*; Hemoglobin 13.2; Platelets 228; Potassium 3.9; Sodium 139  10/03/2015: Cholesterol 284*; HDL 62; LDL Cholesterol 189*; Total CHOL/HDL Ratio 4.6; Triglycerides 166*; VLDL 33   CrCl: 64.55 mL/min from SC[facility] on 10/05/2015  Wt Readings from Last 3 Encounters:  10/26/15 177 lb 12.8 oz (80.65 kg)  10/03/15 180 lb 6.4 oz (81.829 kg)  07/26/15 180 lb (81.647 kg)     Other studies reviewed: Additional studies/records reviewed today include: summarized above  ASSESSMENT AND PLAN:  1. CAD s/p PCI as above: No symptoms concerning for angina at this time. Continue DAPT with aspirin 81 mg daily and Brilinta 90 mg bid for at least the next 12 months. Lopressor decreased to 12.5 mg bid as below. Referral made to cardiac rehab. She is agreeable to go. She states she will eventually need to have multiple teeth pulled 2/2 poor dentition. I advised her she could not come off DAPT at this time given her recent stenting. She will need to await at least 6 months, preferably 12 months prior to stopping DAPT. No plans for further ischemic workup at this time.   2. ICM: She does not appear to be volume overloaded at this time. EF 50% by echo post MI. She complains of fatigue since starting Lopressor. Given this and her heart rate of 54 bpm today in the office I will decrease her Lopressor from 25 mg bid to 12.5 mg bid. She will continue lisinopril 5 mg daily. Heart healthy diet advised. I told the patient sea salt is still salt and should be avoided.   3. HLD: LDL at time of admission in early June 2017 was 189. She was placed on Lipitor 20 mg daily at that time. Check LFT and lipid panel to today. Goal LDL < 70. She also notes some bilateral upper extremity myalgias. Will check CK today. Unlikely to be rhabdomyolysis.   4. Tobacco abuse: She has gone from 2.5 packs daily to 2 cigarettes daily. Continue nicotine patch.  She is not yet ready to fully quit tobacco.  5. Obesity: Weight loss advised. She will continue a heart healthy diet and start cardiac rehab as above.   6. Anxiety/stress: This is her main complaint today. She seems to think all of her above now formal diagnoses occurred overnight rather than over the  numerous years she went without seeing an MD in the outpatient setting. I have attempted to explain this to her, though she continues to go off on rants expressing her concern of fear 2/2 anaesthesia (related to need for pulled teeth), lifestyle changes, and multiple medications. I have written for a one-time dose of Xanax 0.25 mg daily prn anxiety, #30, no RF. She is establishing with a PCP at this time. Any further refills or management of her anxiety should come from her PCP if they feel it is indicated. We will not authorize any refills of this medication. Patient was made aware of this and agrees. Of note, the initial Xanax prescription, ordered through the RN, did not have my supervising physician's name printed on the prescription. I cancelled this Rx and ordered Xanax myself which did have my supervising physician listed on the Rx. The original Rx was voided, torn apart, and placed in the shread box. Witnesses include Desma Maxim, RN and Sanjuan Dame, clinic administrator.   Disposition: F/u with Dr. Kirke Corin, MD or myself in 1 month  Current medicines are reviewed at length with the patient today.  The patient did not have any concerns regarding medicines.  Elinor Dodge PA-C 10/26/2015 2:43 PM     [facility] [place] [address] [place], [place] [address] [phone]

## 2015-10-26 NOTE — Patient Instructions (Addendum)
Medication Instructions:  Your physician has recommended you make the following change in your medication:  1. Decrease metoprolol 12.5 mg Twice daily 2. Xanax 0.25 mg Once Daily as needed.   Labwork: Labs to be done today are Lipid, Liver, and CK.   Testing/Procedures: None ordered  Follow-Up: Your physician recommends that you schedule a follow-up appointment in: 1 month with Dr. Kirke Corin or Eula Listen, PA  You have been referred to Cardiac Rehab. Someone will call you to schedule this appointment.   It was a pleasure seeing you today here in the office. Please do not hesitate to give [place] a call back if you have any further questions. [phone]  [place] Cellar RN, BSN      Any Other Special Instructions Will Be Listed Below (If Applicable).     If you need a refill on your cardiac medications before your next appointment, please call your pharmacy.  Lipid Profile Test WHY AM I HAVING THIS TEST? The lipid profile test gives results that can help predict the likelihood of developing heart disease. The test is also used to monitor treatment for high cholesterol to see if you are reaching your goals. A lipid profile measures the following:  Total cholesterol. Cholesterol is a waxy fat in your blood. If your total cholesterol is elevated, this can increase your risk of coronary heart disease.  High-density lipoprotein (HDL). This is known as the good cholesterol. Having a high level of HDL is good. Your HDL level may be low if you smoke or do not get enough exercise.  Low-density lipoprotein (LDL). This is known as the bad cholesterol and is responsible for the formation of plaque in the arteries. Having a low level of LDL is best.  Cholesterol to HDL ratio. This is calculated by dividing the total cholesterol by the HDL cholesterol. The ratio is used by health care providers for determining your risk of heart disease. A low ratio is best.  Triglycerides. These are a type of fat in  the blood responsible for providing energy to your cells. Low levels are best. WHAT KIND OF SAMPLE IS TAKEN? A blood sample is required for this test. It is usually collected by inserting a needle into a vein. HOW DO I PREPARE FOR THE TEST?  Do not eat or drink anything after midnight on the night before the test or as directed by your health care provider. WHAT ARE THE REFERENCE RANGES? Reference ranges are considered healthy ranges established after testing a large group of healthy people. Reference ranges may vary among different people, labs, and hospitals. It is your responsibility to obtain your test results. Ask the lab or department performing the test when and how you will get your results. Reference ranges for the lipid profile test are as follows: Total Cholesterol  Adult or elderly: less than 200 mg/dL or less than 5.62 mmol/L (SI units).  Child: 120-200 mg/dL.  Infant: 70-175 mg/dL.  Newborns: 53-135 mg/dL. HDL  Female: greater than 45 mg/dL or greater than 1.30 mmol/L (SI units).  Female: greater than 55 mg/dL or greater than 8.65 mmol/L (SI units). HDL reference values based on risk of heart disease:  For low risk of heart disease:  Female: 60 mg/dL or 7.84 mmol/L.  Female: 70 mg/dL or 6.96 mmol/L.  For moderate risk of heart disease:  Female: 45 mg/dL or 2.95 mmol/L.  Female: 55 mg/dL or 2.84 mmol/L.  For high risk of heart disease:  Female: 25 mg/dL or 1.32 mmol/L.  Female: 35 mg/dL or 4.09 mmol/L. LDL  Adult: less than 130 mg/dL.  Children: less than 110 mg/dL. Cholesterol to HDL Ratio Reference values based on risk for coronary heart disease:  Risk that is one half average:  Female: 3.4.  Female: 3.3.  Average risk:  Female: 5.0.  Female: 4.4.  Risk that is two times average (moderate risk):  Female: 10.0.  Female: 7.0.  Risk that is three times average (high risk):  Female: 24.0.  Female: 11.0. Triglycerides  Adult or elderly:  Female:  40-160 mg/dL or 8.11-9.14 mmol/L (SI units).  Female: 35-135 mg/dL or 7.82-9.56 mmol/L (SI units).  Children 64-39 years old:  Female: 30-86 mg/dL.  Female: 32-99 mg/dL.  Children 40-60 years old:  Female: 31-108 mg/dL.  Female: 35-114 mg/dL.  Children 67-35 years old:  Female: 36-138 mg/dL.  Female: 41-138 mg/dL.  Children 57-5 years old:  Female: 40-163 mg/dL.  Female: 40-128 mg/dL. Triglycerides should be less than 400 mg/dL even when you are not fasting.  WHAT DO THE RESULTS MEAN?  Talk with your health care provider to discuss your results, treatment options, and if necessary, the need for more tests. Talk with your health care provider if you have any questions about your results.   This information is not intended to replace advice given to you by your health care provider. Make sure you discuss any questions you have with your health care provider.   Document Released: 05/11/2004 Document Revised: 05/08/2014 Document Reviewed: 08/07/2013 Elsevier Interactive Patient Education 2016 Elsevier Inc.  Liver Function Tests Liver function tests are blood tests to see how well your liver is working. The proteins and enzymes measured in the test can alert your health care provider to inflammation, damage, or disease in your liver. It is common to have liver function tests:  During annual physical exams.  When you are taking certain medicines.  If you have liver disease.  If you drink a lot of alcohol.  When you are not feeling well.  When you have other conditions that may affect the liver. Substances measured may include:   Alanine transaminase (ALT). This is an enzyme in the liver.  Aspartate transaminase (AST). This is an enzyme in the liver, heart, and muscles.  Alkaline phosphatase (ALP). This is a protein in the liver, bile ducts, and bone. It is also in other body tissues.  Total bilirubin. This is a yellow pigment in bile.  Albumin. This is a protein in the  liver.  Prothrombin time and international normalized ratio (PT and INR). PT measures the time that it takes for your blood to clot. INR is a calculation of blood clotting time based upon your PT result. It is also calculated based on normal ranges defined by the laboratory that processed your lab test.  Total protein. This measures two proteins, albumin and globulin, found in the blood. PREPARATION FOR TEST How you prepare will depend on which tests are being done and the reason why these tests are being done. You may need to:  Avoid eating for 4-6 hours before the test or as directed by your health care provider.  Stop taking certain medicines prior to your blood test as directed by your health care provider. RESULTS  It is your responsibility to obtain your test results. Ask the lab or department performing the test when and how you will get your results. Contact your health care provider to discuss any questions you have about your results. RANGE OF NORMAL VALUES Ranges  for normal values may vary among different labs and hospitals. You should always check with your health care provider after having lab work or other tests done to discuss the meaning of your test results and whether your values are considered within normal limits. The following are normal ranges for substances measured in liver function tests: ALT  Infant: may be twice as high as adult values.  Child or adult: 4-36 international units/L at 37C or 4-36 units/L (SI units).  Elderly: may be slightly higher than adult values. AST  Newborn 23-72 days old: 35-140 units/L.  Child under 45 years old: 15-60 units/L.  56-60 years old: 15-50 units/L.  68-15 years old: 10-50 units/L.  47-72 years old: 10-40 units/L.  Adult: 0-35 units/L or 0-0.58 microkatal/L (SI units).  Elderly: slightly higher than adults. ALP  Child under 58 years old: 85-235 units/L.  82-51 years old: 65-210 units/L.  48-66 years old: 60-300  units/L.  68-27 years old: 30-200 units/L.  Adult: 30-120 units/L or 0.5-2.0 microkatal/L (SI units).  Elderly: slightly higher than adult. Total bilirubin  Newborn: 1.0-12.0 mg/dL or 16.1-096 micromoles/L (SI units).  Adult, elderly, or child: 0.3-1.0 mg/dL or 0.4-54 micromoles/L. Albumin  Premature infant: 3.0-4.2 g/dL.  Newborn: 3.5-5.4 g/dL.  Infant: 4.4-5.4 g/dL.  Child: 4.0-5.9 g/dL.  Adult or elderly: 3.5-5.0 g/dL or 09-81 g/L (SI units). PT  11.0-12.5 seconds; 85%-100%. INR  0.8-1.1. Total protein  Premature infant: 4.2-7.6 g/dL.  Newborn: 4.6-7.4 g/dL.  Infant: 6.0-6.7 g/dL.  Child: 6.2-8.0 g/dL.  Adult or elderly: 6.4-8.3 g/dL or 19-14 g/L (SI units). MEANING OF RESULTS OUTSIDE NORMAL VALUE RANGES Sometimes test results can be abnormal due to other factors, such as medicines, exercise, or pregnancy. Follow up with your health care provider if you have any questions about test results outside the normal value ranges. ALT  Levels above the normal range, along with other test results, may indicate liver disease. AST  Levels above the normal range, along with other test results, may indicate liver disease. Sometimes levels also increase after burns, surgery, heart attack, muscle damage, or seizure. ALP  Levels above the normal range, along with other test results, may indicate biliary obstruction, diseases of the liver, bone disease, thyroid disease, tumors, fractures, leukemia or lymphoma, or several other conditions. People with blood type O or B may show higher levels after a fatty meal.  Levels below the normal range, along with other test results, may indicate bone and teeth conditions, malnutrition, protein deficiency, or Wilson disease. Total bilirubin  Levels above the normal range, along with other test results, may indicate problems with the liver, gallbladder, or bile ducts. Albumin  Levels above the normal range, along with other test  results, may indicate dehydration. They may also be caused by a diet that is high in protein. Sometimes, the band placed around the upper arm during the process of drawing blood can cause the level of this protein in your blood to rise and give you a result above the normal range.  Levels below the normal range, along with other tests results, may indicate kidney disease, liver disease, or malabsorption of nutrients. PT and INR  Levels above the normal range mean your blood is clotting slower than normal. This may be due to blood disorders, liver disorders, or low levels of vitamin K. Total protein  Levels above the normal range, along with other test results, may be due to infection or other diseases.  Levels below the normal range, along with other test  results, may be due to an immune system disorder, bleeding, burns, kidney disorder, liver disease, trouble absorbing or getting enough nutrients, or other conditions that affect the intestines.   This information is not intended to replace advice given to you by your health care provider. Make sure you discuss any questions you have with your health care provider.   Document Released: 05/20/2004 Document Revised: 05/08/2014 Document Reviewed: 08/21/2013 Elsevier Interactive Patient Education 2016 Elsevier Inc.   Creatine Kinase Test WHY AM I HAVING THIS TEST? The creatine kinase (CK) test is performed to determine if there has been damage to muscle tissue in your body. This test can be used to help diagnose heart attack, neurologic diseases, or skeletal diseases. Three different forms of CK are present in your body. They are referred to as isoenzymes:  CK-MM is found in your skeletal muscles and heart.  CK-MB is found mostly in your heart.  CK-BB is found mostly in your brain. WHAT KIND OF SAMPLE IS TAKEN? A blood sample is required for this test. It is usually collected by inserting a needle into a vein.  HOW DO I PREPARE FOR THE  TEST? There is no preparation required for this test. Be aware that your health care provider may require blood samples to be taken at regular intervals for up to 1 week. WHAT ARE THE REFERENCE RANGES? Reference ranges are considered healthy ranges established after testing a large group of healthy people. Reference ranges may vary among different people, labs, and hospitals. It is your responsibility to obtain your test results. Ask the lab or department performing the test when and how you will get your results.  The reference ranges for the CK test are as follows: Total CK:  Adult or elderly (values are higher after exercise):  Female: 55-170 units/L or 55-170 units/L (SI units).  Female: 30-135 units/L or 30-135 units/L (SI units).  Newborn: 68-580 units/L (SI units). Isoenzymes:  CK-MM: 100%.  CK-MB: 0%.  CK-BB: 0%. WHAT DO THE RESULTS MEAN? Levels of total CK that are above the reference ranges may indicate injury or diseases affecting the heart, skeletal muscle, or brain. Increased levels of CK-MM isoenzyme may indicate:  Certain diseases affecting the skeletal muscle.  Recent surgery, trauma, or injury.  Conditions that cause convulsions. Increased levels of CK-MB isoenzyme may indicate:  Recent heart attack.  Other conditions that cause injury to the heart muscle. Increased levels of CK-BB isoenzyme may indicate:  Diseases that affect the central nervous system.  Certain psychiatric therapies.  Certain types of cancer.  Injury to the lungs. Talk with your health care provider to discuss your results, treatment options, and if necessary, the need for more tests. Talk with your health care provider if you have any questions about your results.   This information is not intended to replace advice given to you by your health care provider. Make sure you discuss any questions you have with your health care provider.   Document Released: 05/18/2004 Document Revised:  05/08/2014 Document Reviewed: 09/11/2013 Elsevier Interactive Patient Education [employer]

## 2015-10-27 ENCOUNTER — Other Ambulatory Visit: Payer: Self-pay

## 2015-10-27 DIAGNOSIS — E785 Hyperlipidemia, unspecified: Secondary | ICD-10-CM

## 2015-10-27 LAB — CK: CK TOTAL: 92 U/L (ref 24–173)

## 2015-10-27 LAB — LIPID PANEL
CHOL/HDL RATIO: 4.3 ratio (ref 0.0–4.4)
Cholesterol, Total: 229 mg/dL — ABNORMAL HIGH (ref 100–199)
HDL: 53 mg/dL (ref >39)
LDL CALC: 135 mg/dL — AB (ref 0–99)
TRIGLYCERIDES: 203 mg/dL — AB (ref 0–149)
VLDL Cholesterol Cal: 41 mg/dL — ABNORMAL HIGH (ref 5–40)

## 2015-10-27 LAB — HEPATIC FUNCTION PANEL
ALT: 25 IU/L (ref 0–32)
AST: 18 IU/L (ref 0–40)
Albumin: 4.4 g/dL (ref 3.6–4.8)
Alkaline Phosphatase: 119 IU/L — ABNORMAL HIGH (ref 39–117)
Bilirubin, Direct: 0.03 mg/dL (ref 0.00–0.40)
Total Protein: 7.3 g/dL (ref 6.0–8.5)

## 2015-10-27 MED ORDER — ATORVASTATIN CALCIUM 40 MG PO TABS: 40.0000 mg | ORAL_TABLET | Freq: Every day | ORAL | Status: DC

## 2015-10-29 ENCOUNTER — Other Ambulatory Visit: Payer: Self-pay

## 2015-10-29 DIAGNOSIS — E785 Hyperlipidemia, unspecified: Secondary | ICD-10-CM

## 2015-11-01 ENCOUNTER — Other Ambulatory Visit: Payer: Self-pay

## 2015-11-01 MED ORDER — TICAGRELOR 90 MG PO TABS: 90.0000 mg | ORAL_TABLET | Freq: Two times a day (BID) | ORAL | Status: DC

## 2015-11-01 MED ORDER — LISINOPRIL 5 MG PO TABS: 5.0000 mg | ORAL_TABLET | Freq: Every day | ORAL | Status: DC

## 2015-11-03 ENCOUNTER — Telehealth: Payer: Self-pay | Admitting: Cardiovascular Disease

## 2015-11-03 ENCOUNTER — Other Ambulatory Visit: Payer: Self-pay | Admitting: *Deleted

## 2015-11-03 MED ORDER — METOPROLOL TARTRATE 25 MG PO TABS: 12.5000 mg | ORAL_TABLET | Freq: Two times a day (BID) | ORAL | Status: DC

## 2015-11-03 NOTE — Telephone Encounter (Signed)
° ° °*  STAT* If patient is at the pharmacy, call can be transferred to refill team.   1. Which medications need to be refilled? (please list name of each medication and dose if known)  Metoprolol 12.5 mg po x 2   2. Which pharmacy/location (including street and city if local pharmacy) is medication to be sent to? [facility] [address] [place]   3. Do they need a 30 day or 90 day supply? 30

## 2015-11-04 ENCOUNTER — Other Ambulatory Visit: Payer: Self-pay

## 2015-11-04 DIAGNOSIS — I214 Non-ST elevation (NSTEMI) myocardial infarction: Secondary | ICD-10-CM

## 2015-11-23 ENCOUNTER — Other Ambulatory Visit: Payer: Self-pay | Admitting: Physician Assistant

## 2015-11-23 DIAGNOSIS — F419 Anxiety disorder, unspecified: Secondary | ICD-10-CM

## 2015-11-23 DIAGNOSIS — I2510 Atherosclerotic heart disease of native coronary artery without angina pectoris: Secondary | ICD-10-CM

## 2015-11-30 ENCOUNTER — Telehealth: Payer: Self-pay | Admitting: Cardiovascular Disease

## 2015-11-30 ENCOUNTER — Ambulatory Visit (INDEPENDENT_AMBULATORY_CARE_PROVIDER_SITE_OTHER): Payer: Medicare Other | Admitting: Cardiovascular Disease

## 2015-11-30 ENCOUNTER — Encounter: Payer: Self-pay | Admitting: Cardiovascular Disease

## 2015-11-30 VITALS — BP 145/85 | HR 58 | Ht 64.0 in | Wt 176.0 lb

## 2015-11-30 DIAGNOSIS — I255 Ischemic cardiomyopathy: Secondary | ICD-10-CM | POA: Diagnosis not present

## 2015-11-30 DIAGNOSIS — E785 Hyperlipidemia, unspecified: Secondary | ICD-10-CM | POA: Diagnosis not present

## 2015-11-30 DIAGNOSIS — Z720 Tobacco use: Secondary | ICD-10-CM | POA: Diagnosis not present

## 2015-11-30 DIAGNOSIS — I2510 Atherosclerotic heart disease of native coronary artery without angina pectoris: Secondary | ICD-10-CM | POA: Diagnosis not present

## 2015-11-30 NOTE — Patient Instructions (Addendum)
Medication Instructions:  Your physician recommends that you continue on your current medications as directed. Please refer to the Current Medication list given to you today.   Labwork: Lipid and liver profile. Nothing to eat or drink after midnight the evening before your labs.   Testing/Procedures: none  Follow-Up: Your physician recommends that you schedule a follow-up appointment in: 3 months with Dr. Kirke Corin.    Any Other Special Instructions Will Be Listed Below (If Applicable). You have an appointment with: [facility] Dr. Baruch Gouty [address] [place] [phone]  Someone from the office will call you with an appointment.        If you need a refill on your cardiac medications before your next appointment, please call your pharmacy.   Steps to Quit Smoking  Smoking tobacco can be harmful to your health and can affect almost every organ in your body. Smoking puts you, and those around you, at risk for developing many serious chronic diseases. Quitting smoking is difficult, but it is one of the best things that you can do for your health. It is never too late to quit. WHAT ARE THE BENEFITS OF QUITTING SMOKING? When you quit smoking, you lower your risk of developing serious diseases and conditions, such as:  Lung cancer or lung disease, such as COPD.  Heart disease.  Stroke.  Heart attack.  Infertility.  Osteoporosis and bone fractures. Additionally, symptoms such as coughing, wheezing, and shortness of breath may get better when you quit. You may also find that you get sick less often because your body is stronger at fighting off colds and infections. If you are pregnant, quitting smoking can help to reduce your chances of having a baby of low birth weight. HOW DO I GET READY TO QUIT? When you decide to quit smoking, create a plan to make sure that you are successful. Before you quit:  Pick a date to quit. Set a date  within the next two weeks to give you time to prepare.  Write down the reasons why you are quitting. Keep this list in places where you will see it often, such as on your bathroom mirror or in your car or wallet.  Identify the people, places, things, and activities that make you want to smoke (triggers) and avoid them. Make sure to take these actions:  Throw away all cigarettes at home, at work, and in your car.  Throw away smoking accessories, such as Set designer.  Clean your car and make sure to empty the ashtray.  Clean your home, including curtains and carpets.  Tell your family, friends, and coworkers that you are quitting. Support from your loved ones can make quitting easier.  Talk with your health care provider about your options for quitting smoking.  Find out what treatment options are covered by your health insurance. WHAT STRATEGIES CAN I USE TO QUIT SMOKING?  Talk with your healthcare provider about different strategies to quit smoking. Some strategies include:  Quitting smoking altogether instead of gradually lessening how much you smoke over a period of time. Research shows that quitting "cold [place]" is more successful than gradually quitting.  Attending in-person counseling to help you build problem-solving skills. You are more likely to have success in quitting if you attend several counseling sessions. Even short sessions of 10 minutes can be effective.  Finding resources and support systems that can help you to quit smoking and remain smoke-free after you quit. These resources are most  helpful when you use them often. They can include:  Online chats with a Veterinary surgeon.  Telephone quitlines.  Printed Materials engineer.  Support groups or group counseling.  Text messaging programs.  Mobile phone applications.  Taking medicines to help you quit smoking. (If you are pregnant or breastfeeding, talk with your health care provider first.) Some medicines  contain nicotine and some do not. Both types of medicines help with cravings, but the medicines that include nicotine help to relieve withdrawal symptoms. Your health care provider may recommend:  Nicotine patches, gum, or lozenges.  Nicotine inhalers or sprays.  Non-nicotine medicine that is taken by mouth. Talk with your health care provider about combining strategies, such as taking medicines while you are also receiving in-person counseling. Using these two strategies together makes you more likely to succeed in quitting than if you used either strategy on its own. If you are pregnant or breastfeeding, talk with your health care provider about finding counseling or other support strategies to quit smoking. Do not take medicine to help you quit smoking unless told to do so by your health care provider. WHAT THINGS CAN I DO TO MAKE IT EASIER TO QUIT? Quitting smoking might feel overwhelming at first, but there is a lot that you can do to make it easier. Take these important actions:  Reach out to your family and friends and ask that they support and encourage you during this time. Call telephone quitlines, reach out to support groups, or work with a counselor for support.  Ask people who smoke to avoid smoking around you.  Avoid places that trigger you to smoke, such as bars, parties, or smoke-break areas at work.  Spend time around people who do not smoke.  Lessen stress in your life, because stress can be a smoking trigger for some people. To lessen stress, try:  Exercising regularly.  Deep-breathing exercises.  Yoga.  Meditating.  Performing a body scan. This involves closing your eyes, scanning your body from head to toe, and noticing which parts of your body are particularly tense. Purposefully relax the muscles in those areas.  Download or purchase mobile phone or tablet apps (applications) that can help you stick to your quit plan by providing reminders, tips, and  encouragement. There are many free apps, such as QuitGuide from the [employer] developer for Disease Control and Prevention). You can find other support for quitting smoking (smoking cessation) through smokefree.gov and other websites. HOW WILL I FEEL WHEN I QUIT SMOKING? Within the first 24 hours of quitting smoking, you may start to feel some withdrawal symptoms. These symptoms are usually most noticeable 2-3 days after quitting, but they usually do not last beyond 2-3 weeks. Changes or symptoms that you might experience include:  Mood swings.  Restlessness, anxiety, or irritation.  Difficulty concentrating.  Dizziness.  Strong cravings for sugary foods in addition to nicotine.  Mild weight gain.  Constipation.  Nausea.  Coughing or a sore throat.  Changes in how your medicines work in your body.  A depressed mood.  Difficulty sleeping (insomnia). After the first 2-3 weeks of quitting, you may start to notice more positive results, such as:  Improved sense of smell and taste.  Decreased coughing and sore throat.  Slower heart rate.  Lower blood pressure.  Clearer skin.  The ability to breathe more easily.  Fewer sick days. Quitting smoking is very challenging for most people. Do not get discouraged if you are not successful the first time. Some people need  to make many attempts to quit before they achieve long-term success. Do your best to stick to your quit plan, and talk with your health care provider if you have any questions or concerns.   This information is not intended to replace advice given to you by your health care provider. Make sure you discuss any questions you have with your health care provider.   Document Released: 04/11/2001 Document Revised: 09/01/2014 Document Reviewed: 09/01/2014 Elsevier Interactive Patient Education [employer].

## 2015-11-30 NOTE — Telephone Encounter (Signed)
Pt needs PCP. S/w [facility], who states physicians are not accepting new patients. S/w [place], [facility] who has scheduled appt for pt with Dr. Antony Haste, September 28 @ 8am.  Attempted to notify pt of appt w/Dr. Antony Haste. Female who answered states pt is not home at this time.  Will call back.

## 2015-11-30 NOTE — Progress Notes (Signed)
Cardiology Office Note   Date:  11/30/2015   ID:  [name], DOB 1956-01-12, MRN [id number]  PCP:  No PCP Per Patient  Cardiologist:   Lorine Bears, MD   Chief Complaint  Patient presents with  . Other    1 month f/u. Meds reviewed verbally with pt.      History of Present Illness: [name] is a 60 y.o. female who presents for A follow-up visit regarding coronary artery disease and ischemic cardiomyopathy. She presented in June of this year with a small non-ST elevation myocardial infarction. She underwent cardiac catheterization which showed severe proximal and mid LAD disease. No other obstructive disease. She underwent successful angioplasty and 2 overlapped drug-eluting stent placement to the LAD without complications. Ejection fraction on LV gram was 30-35% but improved an echocardiogram before hospital discharge to 50%. She has other chronic medical conditions that include severe hyperlipidemia, essential hypertension and tobacco use. She cut down on smoking from 3 packs per day to one pack per day. She has been doing well and denies any chest pain or shortness of breath. No side effects with medications. She has known history of anxiety and was given a short course of Xanax. She has not established with a primary care physician. She declined attending cardiac rehabilitation.    Past Medical History:  Diagnosis Date  . CAD (coronary artery disease)    a. NSTEMI 10/03/15; b. cardiac cath 10/04/15: LM nl, ost-pLAD 90% s/p PCI/DES, mLAD 80% s/p PCI/DES, mRCA 30%, D1,2,3 nl, p-mLCX 30%, OM1 &2 min irregs, OM3 normal, EF 30-35%, sev HK of mid-distal ant, apical, inf wall, mod elevated LVEDP  . HLD (hyperlipidemia)   . Ischemic cardiomyopathy    a. echo 10/05/15: EF 50%, mild HK of anteroseptal and anterior wall, nl LV diastolic fxn  . Tobacco abuse     Past Surgical History:  Procedure Laterality Date  . CARDIAC CATHETERIZATION N/A 10/04/2015   Procedure: Left  Heart Cath and Coronary Angiography;  Surgeon: Iran Ouch, MD;  Location: [facility] INVASIVE CV LAB;  Service: Cardiovascular;  Laterality: N/A;  . CARDIAC CATHETERIZATION N/A 10/04/2015   Procedure: Coronary Stent Intervention;  Surgeon: Iran Ouch, MD;  Location: [facility] INVASIVE CV LAB;  Service: Cardiovascular;  Laterality: N/A;     Current Outpatient Prescriptions  Medication Sig Dispense Refill  . albuterol (PROVENTIL HFA;VENTOLIN HFA) 108 (90 Base) MCG/ACT inhaler Inhale 2 puffs into the lungs every 6 (six) hours as needed for wheezing or shortness of breath. 1 Inhaler 2  . ALPRAZolam (XANAX) 0.25 MG tablet Take 1 tablet (0.25 mg total) by mouth daily as needed for anxiety. 30 tablet 0  . aspirin 81 MG chewable tablet Chew 1 tablet (81 mg total) by mouth daily.    [name] atorvastatin (LIPITOR) 40 MG tablet Take 1 tablet (40 mg total) by mouth daily. 30 tablet 3  . lisinopril (PRINIVIL,ZESTRIL) 5 MG tablet Take 1 tablet (5 mg total) by mouth daily. 30 tablet 3  . metoprolol tartrate (LOPRESSOR) 25 MG tablet Take 0.5 tablets (12.5 mg total) by mouth 2 (two) times daily. 60 tablet 3  . ticagrelor (BRILINTA) 90 MG TABS tablet Take 1 tablet (90 mg total) by mouth 2 (two) times daily. 60 tablet 3   No current facility-administered medications for this visit.     Allergies:   Penicillins    Social History:  The patient  reports that she has been smoking Cigarettes.  She has been smoking about 1.00 pack  per day. She does not have any smokeless tobacco history on file. She reports that she drinks about 1.2 oz of alcohol per week . She reports that she does not use drugs.   Family History:  The patient's family history includes CAD in her mother; Colon cancer in her mother.    ROS:  Please see the history of present illness.   Otherwise, review of systems are positive for none.   All other systems are reviewed and negative.    PHYSICAL EXAM: VS:  BP (!) 145/85 (BP Location: Left Arm, Patient  Position: Sitting, Cuff Size: Normal)   Pulse (!) 58   Ht 5\' 4"  (1.626 m)   Wt 176 lb (79.8 kg)   BMI 30.21 kg/m  , BMI Body mass index is 30.21 kg/m. GEN: Well nourished, well developed, in no acute distress  HEENT: normal  Neck: no JVD, carotid bruits, or masses Cardiac: RRR; no murmurs, rubs, or gallops,no edema  Respiratory:  clear to auscultation bilaterally, normal work of breathing GI: soft, nontender, nondistended, + BS MS: no deformity or atrophy  Skin: warm and dry, no rash Neuro:  Strength and sensation are intact Psych: euthymic mood, full affect   EKG:  EKG is not ordered today.    Recent Labs: 10/05/2015: BUN 17; Creatinine, Ser 1.16; Hemoglobin 13.2; Platelets 228; Potassium 3.9; Sodium 139 10/26/2015: ALT 25    Lipid Panel    Component Value Date/Time   CHOL 229 (H) 10/26/2015 1427   TRIG 203 (H) 10/26/2015 1427   HDL 53 10/26/2015 1427   CHOLHDL 4.3 10/26/2015 1427   CHOLHDL 4.6 10/03/2015 2143   VLDL 33 10/03/2015 2143   LDLCALC 135 (H) 10/26/2015 1427      Wt Readings from Last 3 Encounters:  11/30/15 176 lb (79.8 kg)  10/26/15 177 lb 12.8 oz (80.6 kg)  10/03/15 180 lb 6.4 oz (81.8 kg)        ASSESSMENT AND PLAN:  1.  Coronary artery disease involving native coronary arteries without angina: She is overall doing very well. I again advised her to attend cardiac rehabilitation but she wants to exercise on her own. Continue dual antiplatelet therapy for at least one year. I discussed with her the importance of healthy lifestyle changes.  2. Ischemic cardiomyopathy with ejection fraction of 50%. Continue treatment with metoprolol and lisinopril. If blood pressure continues to be elevated, I would consider increasing the dose of lisinopril.  3. Hyperlipidemia: She is currently on atorvastatin 40 mg once daily. Most recent LDL was 135 before increasing the dose. I requested a follow-up lipid and liver profile. We might need to consider adding Zetia to  achieve a target LDL of less than 70.  4. Tobacco use: I again discussed with her the importance of smoking cessation.  5. Anxiety: The patient requested a refill on Xanax. I advised her against long-term use of benzodiazepines. I advised her to establish with a primary care physician to discuss other options such as SSRIs.    Disposition:   FU with me in 3 months  Signed,  Lorine Bears, MD  11/30/2015 1:33 PM    [facility]

## 2015-12-01 NOTE — Telephone Encounter (Signed)
S/w pt regarding PCP appointment. She prefers I write information for her and she will pick it up when she has labs drawn here on Monday.  Information left in envelope for pt at front desk.

## 2015-12-06 ENCOUNTER — Other Ambulatory Visit: Payer: Medicare Other

## 2015-12-10 ENCOUNTER — Other Ambulatory Visit: Payer: Medicare Other

## 2015-12-10 ENCOUNTER — Other Ambulatory Visit (INDEPENDENT_AMBULATORY_CARE_PROVIDER_SITE_OTHER): Payer: Medicare Other

## 2015-12-10 DIAGNOSIS — E785 Hyperlipidemia, unspecified: Secondary | ICD-10-CM | POA: Diagnosis not present

## 2015-12-11 LAB — LIPID PANEL
Chol/HDL Ratio: 3.9 ratio units (ref 0.0–4.4)
Cholesterol, Total: 225 mg/dL — ABNORMAL HIGH (ref 100–199)
HDL: 58 mg/dL (ref >39)
LDL Calculated: 143 mg/dL — ABNORMAL HIGH (ref 0–99)
TRIGLYCERIDES: 118 mg/dL (ref 0–149)
VLDL Cholesterol Cal: 24 mg/dL (ref 5–40)

## 2015-12-11 LAB — HEPATIC FUNCTION PANEL
ALBUMIN: 4.5 g/dL (ref 3.6–4.8)
ALK PHOS: 108 IU/L (ref 39–117)
ALT: 24 IU/L (ref 0–32)
AST: 20 IU/L (ref 0–40)
BILIRUBIN TOTAL: 0.2 mg/dL (ref 0.0–1.2)
BILIRUBIN, DIRECT: 0.09 mg/dL (ref 0.00–0.40)
Total Protein: 7.3 g/dL (ref 6.0–8.5)

## 2015-12-11 LAB — PLEASE NOTE

## 2015-12-14 ENCOUNTER — Other Ambulatory Visit: Payer: Self-pay

## 2015-12-14 DIAGNOSIS — E785 Hyperlipidemia, unspecified: Secondary | ICD-10-CM

## 2015-12-14 MED ORDER — ROSUVASTATIN CALCIUM 40 MG PO TABS: 40.0000 mg | ORAL_TABLET | Freq: Every day | ORAL | 3 refills | Status: DC

## 2015-12-14 MED ORDER — EZETIMIBE 10 MG PO TABS: 10.0000 mg | ORAL_TABLET | Freq: Every day | ORAL | 3 refills | Status: DC

## 2015-12-16 ENCOUNTER — Telehealth: Payer: Self-pay | Admitting: Cardiovascular Disease

## 2015-12-16 NOTE — Telephone Encounter (Signed)
Daughter-in-law, Isaiah Serge, called back regarding labs. Reviewed lipid and liver profile results and MD recommendations for medication change. Isaiah Serge verbalized understanding and is agreeable w/labs 9/28 @ 8:20am. She had no further questions at this time.

## 2016-01-08 ENCOUNTER — Other Ambulatory Visit: Payer: Self-pay | Admitting: Physician Assistant

## 2016-01-13 ENCOUNTER — Telehealth: Payer: Self-pay | Admitting: Cardiovascular Disease

## 2016-01-13 NOTE — Telephone Encounter (Signed)
Pt daughter called states since pt started Zetia, she has had extreme headaches and lightheadedness. Please call

## 2016-01-14 NOTE — Telephone Encounter (Signed)
I don't think these symptoms are related to the medication change.

## 2016-01-14 NOTE — Telephone Encounter (Signed)
Pt daughter, Dois Davenport, reports pt has experienced headaches and lightheadedness for 2+ weeks. Sx have intensified and increased in frequency over the past week feels it may be r/t crestor and/or zetia as sx appeared shortly after atorvastatin was d/c'd and new meds added Aug 16. Pt has no other sx. She has never taken any other statin. Forward to MD to review and advise.

## 2016-01-14 NOTE — Telephone Encounter (Signed)
Return pt daughter, Fermin Schwab, call. Left message on her VM

## 2016-01-17 NOTE — Telephone Encounter (Signed)
Reviewed MD response w/pt daughter, Isaiah Serge, who verbalized understanding. Suggested pt f/u w/PCP regarding sx.

## 2016-01-27 ENCOUNTER — Other Ambulatory Visit: Payer: Medicare Other

## 2016-01-27 ENCOUNTER — Ambulatory Visit: Payer: Medicare Other | Admitting: Family Medicine

## 2016-02-09 ENCOUNTER — Ambulatory Visit (INDEPENDENT_AMBULATORY_CARE_PROVIDER_SITE_OTHER): Payer: Medicare Other | Admitting: Family Medicine

## 2016-02-09 ENCOUNTER — Encounter: Payer: Self-pay | Admitting: Family Medicine

## 2016-02-09 VITALS — BP 114/78 | HR 77 | Temp 97.9°F | Ht 64.0 in | Wt 177.2 lb

## 2016-02-09 DIAGNOSIS — F418 Other specified anxiety disorders: Secondary | ICD-10-CM | POA: Diagnosis not present

## 2016-02-09 DIAGNOSIS — I255 Ischemic cardiomyopathy: Secondary | ICD-10-CM | POA: Diagnosis not present

## 2016-02-09 DIAGNOSIS — R053 Chronic cough: Secondary | ICD-10-CM | POA: Insufficient documentation

## 2016-02-09 DIAGNOSIS — R05 Cough: Secondary | ICD-10-CM

## 2016-02-09 DIAGNOSIS — F419 Anxiety disorder, unspecified: Secondary | ICD-10-CM

## 2016-02-09 DIAGNOSIS — M7061 Trochanteric bursitis, right hip: Secondary | ICD-10-CM | POA: Insufficient documentation

## 2016-02-09 DIAGNOSIS — F329 Major depressive disorder, single episode, unspecified: Secondary | ICD-10-CM

## 2016-02-09 MED ORDER — SERTRALINE HCL 50 MG PO TABS: ORAL_TABLET | ORAL | 3 refills | Status: DC

## 2016-02-09 MED ORDER — OMEPRAZOLE 20 MG PO CPDR: 20.0000 mg | DELAYED_RELEASE_CAPSULE | Freq: Every day | ORAL | 3 refills | Status: DC

## 2016-02-09 NOTE — Assessment & Plan Note (Signed)
Symptoms of anxiety and depression. No SI. We'll start on Zoloft as outlined below. She'll follow-up in 6 weeks. Given return precautions.

## 2016-02-09 NOTE — Patient Instructions (Signed)
Nice to see you. We are going to start you on Zoloft for your anxiety and depression. We will have you do exercises for your right leg. You can ice the area that is painful. You can also take Tylenol over-the-counter for this. We will obtain lung function testing new evaluate your cough. We will start you on a reflux medication as well. If you develop chest pain, shortness of breath, thoughts of harming herself, or any new change in symptoms please seek medical attention.   Trochanteric Bursitis You have hip pain due to trochanteric bursitis. Bursitis means that the sack near the outside of the hip is filled with fluid and inflamed. This sack is made up of protective soft tissue. The pain from trochanteric bursitis can be severe and keep you from sleep. It can radiate to the buttocks or down the outside of the thigh to the knee. The pain is almost always worse when rising from the seated or lying position and with walking. Pain can improve after you take a few steps. It happens more often in people with hip joint and lumbar spine problems, such as arthritis or previous surgery. Very rarely the trochanteric bursa can become infected, and antibiotics and/or surgery may be needed. Treatment often includes an injection of local anesthetic mixed with cortisone medicine. This medicine is injected into the area where it is most tender over the hip. Repeat injections may be necessary if the response to treatment is slow. You can apply ice packs over the tender area for 30 minutes every 2 hours for the next few days. Anti-inflammatory and/or narcotic pain medicine may also be helpful. Limit your activity for the next few days if the pain continues. See your caregiver in 5-10 days if you are not greatly improved.  SEEK IMMEDIATE MEDICAL CARE IF:  You develop severe pain, fever, or increased redness.  You have pain that radiates below the knee. EXERCISES STRETCHING EXERCISES - Trochanteric Bursitis  These  exercises may help you when beginning to rehabilitate your injury. Your symptoms may resolve with or without further involvement from your physician, physical therapist, or athletic trainer. While completing these exercises, remember:   Restoring tissue flexibility helps normal motion to return to the joints. This allows healthier, less painful movement and activity.  An effective stretch should be held for at least 30 seconds.  A stretch should never be painful. You should only feel a gentle lengthening or release in the stretched tissue. STRETCH - Iliotibial Band  On the floor or bed, lie on your side so your injured leg is on top. Bend your knee and grab your ankle.  Slowly bring your knee back so that your thigh is in line with your trunk. Keep your heel at your buttocks and gently arch your back so your head, shoulders and hips line up.  Slowly lower your leg so that your knee approaches the floor/bed until you feel a gentle stretch on the outside of your thigh. If you do not feel a stretch and your knee will not fall farther, place the heel of your opposite foot on top of your knee and pull your thigh down farther.  Hold this stretch for __________ seconds.  Repeat __________ times. Complete this exercise __________ times per day. STRETCH - Hamstrings, Supine   Lie on your back. Loop a belt or towel over the ball of your foot as shown.  Straighten your knee and slowly pull on the belt to raise your injured leg. Do not allow  the knee to bend. Keep your opposite leg flat on the floor.  Raise the leg until you feel a gentle stretch behind your knee or thigh. Hold this position for __________ seconds.  Repeat __________ times. Complete this stretch __________ times per day. STRETCH - Quadriceps, Prone   Lie on your stomach on a firm surface, such as a bed or padded floor.  Bend your knee and grasp your ankle. If you are unable to reach your ankle or pant leg, use a belt around your  foot to lengthen your reach.  Gently pull your heel toward your buttocks. Your knee should not slide out to the side. You should feel a stretch in the front of your thigh and/or knee.  Hold this position for __________ seconds.  Repeat __________ times. Complete this stretch __________ times per day. STRETCHING - Hip Flexors, Lunge Half kneel with your knee on the floor and your opposite knee bent and directly over your ankle.  Keep good posture with your head over your shoulders. Tighten your buttocks to point your tailbone downward; this will prevent your back from arching too much.  You should feel a gentle stretch in the front of your thigh and/or hip. If you do not feel any resistance, slightly slide your opposite foot forward and then slowly lunge forward so your knee once again lines up over your ankle. Be sure your tailbone remains pointed downward.  Hold this stretch for __________ seconds.  Repeat __________ times. Complete this stretch __________ times per day. STRETCH - Adductors, Lunge  While standing, spread your legs.  Lean away from your injured leg by bending your opposite knee. You may rest your hands on your thigh for balance.  You should feel a stretch in your inner thigh. Hold for __________ seconds.  Repeat __________ times. Complete this exercise __________ times per day.   This information is not intended to replace advice given to you by your health care provider. Make sure you discuss any questions you have with your health care provider.   Document Released: 05/25/2004 Document Revised: 09/01/2014 Document Reviewed: 07/30/2008 Elsevier Interactive Patient Education [employer]

## 2016-02-09 NOTE — Progress Notes (Signed)
Marikay Alar, MD Phone: [phone]  [name] is a 60 y.o. female who presents today for new patient visit.  Anxiety/depression: Patient notes this has been going on for sometime. Worsened when she had her heart attack. She was on Xanax as needed after getting discharge from the hospital. Would take 1 every couple of days. Is interested in further treatment for this. Has never seen a Veterinary surgeon. No SI.  GAD 7 : Generalized Anxiety Score 02/09/2016  Nervous, Anxious, on Edge 2  Control/stop worrying 2  Worry too much - different things 2  Trouble relaxing 1  Restless 0  Easily annoyed or irritable 2  Afraid - awful might happen 2  Total GAD 7 Score 11   Depression screen PHQ 2/9 02/09/2016  Decreased Interest 3  Down, Depressed, Hopeless 2  PHQ - 2 Score 5  Altered sleeping 1  Tired, decreased energy 3  Feeling bad or failure about yourself  0  Trouble concentrating 0  Moving slowly or fidgety/restless 0  Suicidal thoughts 0  PHQ-9 Score 9    Right lateral hip discomfort: Patient notes this hurts when she gets up to walk. Also hurts when she lies on her hip. Notes it hurts from her right lateral hip down to her knee. Throbs at night. No injury. No swelling.  Chronic cough: Patient notes for at least last year she's had a dry cough. Has an extensive smoking history. She was smoking 3 packs a day up until she had a heart attack. Now down to half a pack a day. Notes she had a Ventolin inhaler though this was not beneficial. Does take lisinopril though the cough predates starting on lisinopril. Does have reflux and has symptoms every 2-3 days. No allergy symptoms. Take some Zantac with good benefit. Had a benign chest x-ray when was in the hospital.  Active Ambulatory Problems    Diagnosis Date Noted  . NSTEMI (non-ST elevated myocardial infarction) (HCC) 10/03/2015  . CAD (coronary artery disease)   . Ischemic cardiomyopathy   . HLD (hyperlipidemia)   . Tobacco  abuse   . Status post insertion of drug-eluting stent into left anterior descending (LAD) artery 10/26/2015  . Anxiety and depression 10/26/2015  . Chronic cough 02/09/2016  . Trochanteric bursitis, right hip 02/09/2016   Resolved Ambulatory Problems    Diagnosis Date Noted  . No Resolved Ambulatory Problems   Past Medical History:  Diagnosis Date  . CAD (coronary artery disease)   . HLD (hyperlipidemia)   . Ischemic cardiomyopathy   . Tobacco abuse     Family History  Problem Relation Age of Onset  . CAD Mother   . Colon cancer Mother     Social History   Social History  . Marital status: Divorced    Spouse name: N/A  . Number of children: N/A  . Years of education: N/A   Occupational History  . Not on file.   Social History Main Topics  . Smoking status: Current Every Day Smoker    Packs/day: 1.00    Types: Cigarettes  . Smokeless tobacco: Not on file     Comment: for 50 years now  . Alcohol use 1.2 oz/week    2 Standard drinks or equivalent per week  . Drug use: No  . Sexual activity: Not on file   Other Topics Concern  . Not on file   Social History Narrative   Lives at home with son and daughter-in-law. Very independent and active at baseline.  ROS  General:  Negative for nexplained weight loss, fever Skin: Negative for new or changing mole, sore that won't heal HEENT: Negative for trouble hearing, trouble seeing, ringing in ears, mouth sores, hoarseness, change in voice, dysphagia. CV:  Negative for chest pain, dyspnea, edema, palpitations Resp: Positive for cough, negative for dyspnea, hemoptysis GI: Negative for nausea, vomiting, diarrhea, constipation, abdominal pain, melena, hematochezia. GU: Negative for dysuria, incontinence, urinary hesitance, hematuria, vaginal or penile discharge, polyuria, sexual difficulty, lumps in testicle or breasts MSK: Negative for muscle cramps or aches, positive joint pain or swelling Neuro: Negative for  headaches, weakness, numbness, dizziness, passing out/fainting Psych: Positive for anxiety, Negative for depression, memory problems  Objective  Physical Exam Vitals:   02/09/16 1423  BP: 114/78  Pulse: 77  Temp: 97.9 F (36.6 C)    BP Readings from Last 3 Encounters:  02/09/16 114/78  11/30/15 (!) 145/85  10/26/15 120/70   Wt Readings from Last 3 Encounters:  02/09/16 177 lb 3.2 oz (80.4 kg)  11/30/15 176 lb (79.8 kg)  10/26/15 177 lb 12.8 oz (80.6 kg)    Physical Exam  Constitutional: No distress.  HENT:  Head: Normocephalic and atraumatic.  Mouth/Throat: Oropharynx is clear and moist.  Eyes: Conjunctivae are normal. Pupils are equal, round, and reactive to light.  Cardiovascular: Normal rate, regular rhythm and normal heart sounds.   Pulmonary/Chest: Effort normal and breath sounds normal.  Abdominal: Soft. Bowel sounds are normal. She exhibits no distension. There is no tenderness. There is no rebound and no guarding.  Musculoskeletal: She exhibits no edema.  Tenderness over the right greater trochanter, no tenderness of the lateral right thigh, no tenderness of the left greater trochanter or thigh, no swelling  Neurological: She is alert. Gait normal.  5 out of 5 strength bilateral quads, hamstrings, plantar flexion, and dorsiflexion, sensation to light touch intact in bilateral lower extremities  Skin: Skin is warm and dry. She is not diaphoretic.     Assessment/Plan:   Chronic cough Suspect related to her chronic tobacco abuse. Could also be related to reflux. Benign lung exam. Chest x-ray earlier this year unremarkable. We will obtain PFTs. We'll start on Prilosec. She'll continue to monitor. Encourage smoking cessation.  Anxiety and depression Symptoms of anxiety and depression. No SI. We'll start on Zoloft as outlined below. She'll follow-up in 6 weeks. Given return precautions.  Trochanteric bursitis, right hip Exam and history most consistent with  trochanteric bursitis. Discussed Tylenol, icing, and exercises. Exercises were given.   Orders Placed This Encounter  Procedures  . Pulmonary function test    Standing Status:   Future    Standing Expiration Date:   02/08/2017    Scheduling Instructions:     Please have a pulmonologist read the PFTs    Order Specific Question:   Where should this test be performed?    Answer:   [facility]    Order Specific Question:   Full PFT: includes the following: basic spirometry, spirometry pre & post bronchodilator, diffusion capacity (DLCO), lung volumes    Answer:   Full PFT    Meds ordered this encounter  Medications  . sertraline (ZOLOFT) 50 MG tablet    Sig: Take 25 mg (half a tablet) by mouth daily for 7 days, then take 50 mg (one tablet) by mouth daily    Dispense:  30 tablet    Refill:  3  . omeprazole (PRILOSEC) 20 MG capsule    Sig: Take 1 capsule (20  mg total) by mouth daily.    Dispense:  30 capsule    Refill:  3     Marikay Alar, MD [facility]

## 2016-02-09 NOTE — Assessment & Plan Note (Signed)
Suspect related to her chronic tobacco abuse. Could also be related to reflux. Benign lung exam. Chest x-ray earlier this year unremarkable. We will obtain PFTs. We'll start on Prilosec. She'll continue to monitor. Encourage smoking cessation.

## 2016-02-09 NOTE — Progress Notes (Signed)
Pre visit review using our clinic review tool, if applicable. No additional management support is needed unless otherwise documented below in the visit note. 

## 2016-02-09 NOTE — Assessment & Plan Note (Signed)
Exam and history most consistent with trochanteric bursitis. Discussed Tylenol, icing, and exercises. Exercises were given.

## 2016-02-10 ENCOUNTER — Encounter: Payer: Self-pay | Admitting: Family Medicine

## 2016-02-16 ENCOUNTER — Other Ambulatory Visit: Payer: Self-pay | Admitting: Physician Assistant

## 2016-03-02 ENCOUNTER — Ambulatory Visit: Payer: Medicare Other | Admitting: Cardiovascular Disease

## 2016-03-08 ENCOUNTER — Telehealth: Payer: Self-pay | Admitting: Cardiovascular Disease

## 2016-03-08 NOTE — Telephone Encounter (Signed)
Received Medical Clearance request from [facility] for IV sedation and local anesthetic for extractions.  Pt did not show for Sept 28 labs or November 2 OV w/Dr. Kirke Corin. Called pt. No answer, no VM set up. Left message for daughter, Isaiah Serge, to contact our office. Notified [facility] that pt will need to f/u OV before clearance can be completed.

## 2016-03-14 ENCOUNTER — Other Ambulatory Visit: Payer: Self-pay | Admitting: Physician Assistant

## 2016-03-14 ENCOUNTER — Telehealth: Payer: Self-pay | Admitting: Family Medicine

## 2016-03-14 ENCOUNTER — Telehealth: Payer: Self-pay | Admitting: Cardiovascular Disease

## 2016-03-14 ENCOUNTER — Other Ambulatory Visit: Payer: Self-pay | Admitting: Family Medicine

## 2016-03-14 NOTE — Telephone Encounter (Signed)
Left msg with patient , and daughter in law to call and schedule

## 2016-03-14 NOTE — Telephone Encounter (Signed)
Pt daughter in law called and stated that they need a refill on omeprazole (PRILOSEC) 20 MG capsule. Thank you!  Call pt @ [phone]  Pharmacy - [id number] - [place] ([address]), [place] - [address]

## 2016-03-15 NOTE — Telephone Encounter (Signed)
LM on voicemail that there is 3 refills left on RX at pharmacy. They need to contact the pharmacy

## 2016-03-16 ENCOUNTER — Ambulatory Visit (INDEPENDENT_AMBULATORY_CARE_PROVIDER_SITE_OTHER): Payer: Medicare Other | Admitting: Family

## 2016-03-16 ENCOUNTER — Encounter: Payer: Self-pay | Admitting: Family

## 2016-03-16 ENCOUNTER — Ambulatory Visit (INDEPENDENT_AMBULATORY_CARE_PROVIDER_SITE_OTHER): Payer: Medicare Other

## 2016-03-16 VITALS — BP 120/80 | HR 59 | Temp 98.4°F | Ht 64.0 in | Wt 172.4 lb

## 2016-03-16 DIAGNOSIS — M7061 Trochanteric bursitis, right hip: Secondary | ICD-10-CM | POA: Diagnosis not present

## 2016-03-16 DIAGNOSIS — I214 Non-ST elevation (NSTEMI) myocardial infarction: Secondary | ICD-10-CM | POA: Diagnosis not present

## 2016-03-16 DIAGNOSIS — I255 Ischemic cardiomyopathy: Secondary | ICD-10-CM | POA: Diagnosis not present

## 2016-03-16 DIAGNOSIS — R05 Cough: Secondary | ICD-10-CM

## 2016-03-16 DIAGNOSIS — R053 Chronic cough: Secondary | ICD-10-CM

## 2016-03-16 MED ORDER — PREDNISONE 10 MG PO TABS: ORAL_TABLET | ORAL | 0 refills | Status: DC

## 2016-03-16 NOTE — Patient Instructions (Signed)
Chest xray  Prednisone start if no infection on chest xray; wait to fill until you hear results   Referral PT  Over-the-counter medications you may try for hip pain include:   ThermaCare patches   Capsaicin cream   Icy hot  If there is no improvement in your symptoms, or if there is any worsening of symptoms, or if you have any additional concerns, please return for re-evaluation; or, if we are closed, consider going to the Emergency Room for evaluation if symptoms urgent.

## 2016-03-16 NOTE — Assessment & Plan Note (Signed)
Worsening. Symptoms consistent with trochanteric bursitis. Referral to physical therapy. Trial of prednisone. OTC topical agents advised.

## 2016-03-16 NOTE — Progress Notes (Signed)
Pre visit review using our clinic review tool, if applicable. No additional management support is needed unless otherwise documented below in the visit note. 

## 2016-03-16 NOTE — Progress Notes (Signed)
Subjective:    Patient ID: [name], female    DOB: January 28, 1956, 60 y.o.   MRN: [id number]  CC: [name] is a 60 y.o. female who presents today for an acute visit.    HPI: CC: Right hip pain of and on for several months. Tried motrin without relief whereas had worked in past. Painful to sleep on right hip at night. Not sleeping well. No injury, falls.  Endorses numbness, tingling which radiates to lett thigh. No fever, chills. Tried heat, ice. Exercises with no relief.   H/o MI 08/3015. Denies exertional chest pain or pressure, numbness or tingling radiating to left arm or jaw, palpitations, dizziness, frequent headaches, changes in vision, or shortness of breath.  Following with cardiology.    Also complains of chronic cough over last couple of months, unchanged. Dry. No fever. Occasional wheeze. Continues to smoke. prilosec not helping.       HISTORY:  Past Medical History:  Diagnosis Date  . CAD (coronary artery disease)    a. NSTEMI 10/03/15; b. cardiac cath 10/04/15: LM nl, ost-pLAD 90% s/p PCI/DES, mLAD 80% s/p PCI/DES, mRCA 30%, D1,2,3 nl, p-mLCX 30%, OM1 &2 min irregs, OM3 normal, EF 30-35%, sev HK of mid-distal ant, apical, inf wall, mod elevated LVEDP  . HLD (hyperlipidemia)   . Ischemic cardiomyopathy    a. echo 10/05/15: EF 50%, mild HK of anteroseptal and anterior wall, nl LV diastolic fxn  . Tobacco abuse    Past Surgical History:  Procedure Laterality Date  . CARDIAC CATHETERIZATION N/A 10/04/2015   Procedure: Left Heart Cath and Coronary Angiography;  Surgeon: Iran Ouch, MD;  Location: [facility]IVE CV [facility];  Service: Cardiovascular;  Laterality: N/A;  . CARDIAC CATHETERIZATION N/A 10/04/2015   Procedure: Coronary Stent Intervention;  Surgeon: Iran Ouch, MD;  Location: [facility] CV [facility];  Service: Cardiovascular;  Laterality: N/A;   Family History  Problem Relation Age of Onset  . CAD Mother   . Colon cancer Mother     Allergies:  Penicillins Current Outpatient Prescriptions on File Prior to Visit  Medication Sig Dispense Refill  . albuterol (PROVENTIL HFA;VENTOLIN HFA) 108 (90 Base) MCG/ACT inhaler Inhale 2 puffs into the lungs every 6 (six) hours as needed for wheezing or shortness of breath. 1 Inhaler 2  . aspirin 81 MG chewable tablet Chew 1 tablet (81 mg total) by mouth daily.    [name] BRILINTA 90 MG TABS tablet TAKE ONE TABLET BY MOUTH TWICE DAILY 60 tablet 3  . lisinopril (PRINIVIL,ZESTRIL) 5 MG tablet TAKE ONE TABLET BY MOUTH ONCE DAILY 30 tablet 3  . metoprolol tartrate (LOPRESSOR) 25 MG tablet Take 0.5 tablets (12.5 mg total) by mouth 2 (two) times daily. 60 tablet 3  . metoprolol tartrate (LOPRESSOR) 25 MG tablet TAKE ONE-HALF TABLET BY MOUTH TWICE DAILY 60 tablet 2  . omeprazole (PRILOSEC) 20 MG capsule Take 1 capsule (20 mg total) by mouth daily. 30 capsule 3  . rosuvastatin (CRESTOR) 40 MG tablet Take 1 tablet (40 mg total) by mouth daily. 30 tablet 3  . sertraline (ZOLOFT) 50 MG tablet Take 25 mg (half a tablet) by mouth daily for 7 days, then take 50 mg (one tablet) by mouth daily 30 tablet 3  . ezetimibe (ZETIA) 10 MG tablet Take 1 tablet (10 mg total) by mouth daily. 30 tablet 3   No current facility-administered medications on file prior to visit.     Social History  Substance Use Topics  . Smoking  status: Current Every Day Smoker    Packs/day: 1.00    Types: Cigarettes  . Smokeless tobacco: Not on file     Comment: for 50 years now  . Alcohol use 1.2 oz/week    2 Standard drinks or equivalent per week    Review of Systems  Constitutional: Negative for chills and fever.  HENT: Negative for congestion, ear pain and sinus pressure.   Respiratory: Positive for cough and wheezing. Negative for shortness of breath.   Cardiovascular: Negative for chest pain and palpitations.  Gastrointestinal: Negative for nausea and vomiting.  Musculoskeletal: Negative for back pain.      Objective:    BP  120/80   Pulse (!) 59   Temp 98.4 F (36.9 C) (Oral)   Ht 5\' 4"  (1.626 m)   Wt 172 lb 6.4 oz (78.2 kg)   SpO2 98%   BMI 29.59 kg/m    Physical Exam  Constitutional: She appears well-developed and well-nourished.  HENT:  Head: Normocephalic and atraumatic.  Right Ear: Hearing, tympanic membrane, external ear and ear canal normal. No drainage, swelling or tenderness. No foreign bodies. Tympanic membrane is not erythematous and not bulging. No middle ear effusion. No decreased hearing is noted.  Left Ear: Hearing, tympanic membrane, external ear and ear canal normal. No drainage, swelling or tenderness. No foreign bodies. Tympanic membrane is not erythematous and not bulging.  No middle ear effusion. No decreased hearing is noted.  Nose: Nose normal. No rhinorrhea. Right sinus exhibits no maxillary sinus tenderness and no frontal sinus tenderness. Left sinus exhibits no maxillary sinus tenderness and no frontal sinus tenderness.  Mouth/Throat: Uvula is midline, oropharynx is clear and moist and mucous membranes are normal. No oropharyngeal exudate, posterior oropharyngeal edema, posterior oropharyngeal erythema or tonsillar abscesses.  Eyes: Conjunctivae are normal.  Cardiovascular: Normal rate, regular rhythm, normal heart sounds and normal pulses.   Pulmonary/Chest: Effort normal and breath sounds normal. She has no wheezes. She has no rhonchi. She has no rales.  Musculoskeletal:       Lumbar back: She exhibits normal range of motion, no tenderness, no bony tenderness, no swelling, no edema, no pain and no spasm.  Full range of motion with flexion, tension, lateral side bends. No bony tenderness. No pain, numbness, tingling elicited with single leg raise bilaterally.   Lymphadenopathy:       Head (right side): No submental, no submandibular, no tonsillar, no preauricular, no posterior auricular and no occipital adenopathy present.       Head (left side): No submental, no submandibular, no  tonsillar, no preauricular, no posterior auricular and no occipital adenopathy present.    She has no cervical adenopathy.  Neurological: She is alert. She has normal strength. No sensory deficit.  Reflex Scores:      Patellar reflexes are 2+ on the right side and 2+ on the left side. Sensation and strength intact bilateral lower extremities.  Skin: Skin is warm and dry.  Psychiatric: She has a normal mood and affect. Her speech is normal and behavior is normal. Thought content normal.  Vitals reviewed.      Assessment & Plan:   Problem List Items Addressed This Visit      Cardiovascular and Mediastinum   NSTEMI (non-ST elevated myocardial infarction) (HCC)    No chest pain today. Continues to follow with cardiology. Follow-up with PCP.      Relevant Medications   ezetimibe (ZETIA) 10 MG tablet     Musculoskeletal and Integument  Trochanteric bursitis, right hip    Worsening. Symptoms consistent with trochanteric bursitis. Referral to physical therapy. Trial of prednisone. OTC topical agents advised.       Relevant Medications   predniSONE (DELTASONE) 10 MG tablet   Other Relevant Orders   Ambulatory referral to Physical Therapy     Other   Chronic cough - Primary    Suspect COPD bronchitis. Afebrile. Sao2 98.Pending CXR. Education provided on use of inhaler PRN for wheezing. Encouraged smoking cessation. F/u PCP.       Relevant Orders   DG Chest 2 View       I have discontinued Ms. Magos's ALPRAZolam. I am also having her start on predniSONE. Additionally, I am having her maintain her albuterol, aspirin, metoprolol tartrate, rosuvastatin, ezetimibe, metoprolol tartrate, sertraline, omeprazole, BRILINTA, lisinopril, and ezetimibe.   Meds ordered this encounter  Medications  . ezetimibe (ZETIA) 10 MG tablet  . predniSONE (DELTASONE) 10 MG tablet    Sig: Take 4 tablets ( total 40 mg) by mouth for 2 days; take 3 tablets ( total 30 mg) by mouth for 2 days; take  2 tablets ( total 20 mg) by mouth for 1 day; take 1 tablet ( total 10 mg) by mouth for 1 day.    Dispense:  17 tablet    Refill:  0    Order Specific Question:   Supervising Provider    Answer:   Sherlene ShamsULLO, TERESA L [[id number]]    Return precautions given.   Risks, benefits, and alternatives of the medications and treatment plan prescribed today were discussed, and patient expressed understanding.   Education regarding symptom management and diagnosis given to patient on AVS.  Continue to follow with Marikay AlarEric Sonnenberg, MD for routine health maintenance.   [name] SafeBertha Farino and I agreed with plan.   Rennie PlowmanMargaret Arnett, FNP

## 2016-03-16 NOTE — Assessment & Plan Note (Signed)
No chest pain today. Continues to follow with cardiology. Follow-up with PCP.

## 2016-03-16 NOTE — Assessment & Plan Note (Addendum)
Suspect COPD bronchitis. Afebrile. Sao2 98.Pending CXR. Education provided on use of inhaler PRN for wheezing. Encouraged smoking cessation. F/u PCP.

## 2016-03-27 ENCOUNTER — Ambulatory Visit: Payer: Medicare Other | Admitting: Family Medicine

## 2016-03-27 DIAGNOSIS — Z0289 Encounter for other administrative examinations: Secondary | ICD-10-CM

## 2016-04-03 ENCOUNTER — Encounter: Payer: Self-pay | Admitting: Physician Assistant

## 2016-04-03 ENCOUNTER — Telehealth: Payer: Self-pay | Admitting: Physician Assistant

## 2016-04-03 ENCOUNTER — Ambulatory Visit (INDEPENDENT_AMBULATORY_CARE_PROVIDER_SITE_OTHER): Payer: Medicare Other | Admitting: Physician Assistant

## 2016-04-03 VITALS — BP 102/62 | HR 50 | Ht 62.0 in | Wt 171.8 lb

## 2016-04-03 DIAGNOSIS — I2510 Atherosclerotic heart disease of native coronary artery without angina pectoris: Secondary | ICD-10-CM

## 2016-04-03 DIAGNOSIS — F419 Anxiety disorder, unspecified: Secondary | ICD-10-CM

## 2016-04-03 DIAGNOSIS — I214 Non-ST elevation (NSTEMI) myocardial infarction: Secondary | ICD-10-CM

## 2016-04-03 DIAGNOSIS — Z01810 Encounter for preprocedural cardiovascular examination: Secondary | ICD-10-CM | POA: Diagnosis not present

## 2016-04-03 DIAGNOSIS — I255 Ischemic cardiomyopathy: Secondary | ICD-10-CM

## 2016-04-03 DIAGNOSIS — E782 Mixed hyperlipidemia: Secondary | ICD-10-CM

## 2016-04-03 DIAGNOSIS — I5022 Chronic systolic (congestive) heart failure: Secondary | ICD-10-CM | POA: Diagnosis not present

## 2016-04-03 MED ORDER — CARVEDILOL 3.125 MG PO TABS: 3.1250 mg | ORAL_TABLET | Freq: Two times a day (BID) | ORAL | 6 refills | Status: DC

## 2016-04-03 MED ORDER — TICAGRELOR 90 MG PO TABS: 90.0000 mg | ORAL_TABLET | Freq: Two times a day (BID) | ORAL | 5 refills | Status: DC

## 2016-04-03 NOTE — Telephone Encounter (Signed)
Cardiac clearance faxed to [facility]. Original placed in "Faxed" bin on Pamela's desk.

## 2016-04-03 NOTE — Patient Instructions (Signed)
Medication Instructions:  Your physician has recommended you make the following change in your medication:  1. STOP taking Lisinopril 2. STOP taking Metoprolol 3. Start Coreg 3.125 mg Two times daily 4. Refills sent in for Brilinta until June 2018   Labwork: Labs done today for lipid profile and CMP   Follow-Up: Your physician recommends that you schedule a follow-up appointment in: 3 months with Dr. Kirke CorinArida or Eula Listenyan Dunn PA  It was a pleasure seeing you today here in the office. Please do not hesitate to give us a call back if you have any further questions. [phone]  Sewaren CellarPamela A. RN, BSN     Lipid Profile Test Why am I having this test? The lipid profile test gives results that can help predict the likelihood of developing heart disease. The test is also used to monitor treatment for high cholesterol to see if you are reaching your goals. A lipid profile measures the following:  Total cholesterol. Cholesterol is a waxy fat in your blood. If your total cholesterol is elevated, this can increase your risk of coronary heart disease.  High-density lipoprotein (HDL). This is known as the good cholesterol. Having a high level of HDL is good. Your HDL level may be low if you smoke or do not get enough exercise.  Low-density lipoprotein (LDL). This is known as the bad cholesterol and is responsible for the formation of plaque in the arteries. Having a low level of LDL is best.  Cholesterol to HDL ratio. This is calculated by dividing the total cholesterol by the HDL cholesterol. The ratio is used by health care providers for determining your risk of heart disease. A low ratio is best.  Triglycerides. These are a type of fat in the blood responsible for providing energy to your cells. Low levels are best. What kind of sample is taken? A blood sample is required for this test. It is usually collected by inserting a needle into a vein. How do I prepare for this test? Do not eat or drink  anything after midnight on the night before the test or as directed by your health care provider. What are the reference ranges? Reference ranges are considered healthy ranges established after testing a large group of healthy people. Reference ranges may vary among different people, labs, and hospitals. It is your responsibility to obtain your test results. Ask the lab or department performing the test when and how you will get your results. Reference ranges for the lipid profile test are as follows: Total Cholesterol  Adult or elderly: less than 200 mg/dL or less than 0.985.20 mmol/L (SI units).  Child: 120-200 mg/dL.  Infant: 70-175 mg/dL.  Newborns: 53-135 mg/dL. HDL  Female: greater than 45 mg/dL or greater than 1.190.75 mmol/L (SI units).  Female: greater than 55 mg/dL or greater than 1.470.91 mmol/L (SI units). HDL reference values based on risk of heart disease:  For low risk of heart disease:  Female: 60 mg/dL or 8.291.55 mmol/L.  Female: 70 mg/dL or 5.621.81 mmol/L.  For moderate risk of heart disease:  Female: 45 mg/dL or 1.301.17 mmol/L.  Female: 55 mg/dL or 8.651.42 mmol/L.  For high risk of heart disease:  Female: 25 mg/dL or 7.840.65 mmol/L.  Female: 35 mg/dL or 6.960.90 mmol/L. LDL  Adult: less than 130 mg/dL.  Children: less than 110 mg/dL. Cholesterol to HDL Ratio  Reference values based on risk for coronary heart disease:  Risk that is one half average:  Female: 3.4.  Female: 3.3.  Average risk:  Female: 5.0.  Female: 4.4.  Risk that is two times average (moderate risk):  Female: 10.0.  Female: 7.0.  Risk that is three times average (high risk):    Female: 11.0. Triglycerides  Adult or elderly:  Female: 40-160 mg/dL or 5.72-6.20 mmol/L (SI units).  Female: 35-135 mg/dL or 3.55-9.74 mmol/L (SI units).  Children 108-20 years old:  Female: 30-86 mg/dL.  Female: 32-99 mg/dL.  Children 54-58 years old:  Female: 31-108 mg/dL.  Female: 35-114 mg/dL.  Children 96-15 years  old:  Female: 36-138 mg/dL.  Female: 41-138 mg/dL.  Children 30-76 years old:  Female: 40-163 mg/dL.  Female: 40-128 mg/dL. Triglycerides should be less than 400 mg/dL even when you are not fasting. What do the results mean? Talk with your health care provider to discuss your results, treatment options, and if necessary, the need for more tests. Talk with your health care provider if you have any questions about your results. Talk with your health care provider to discuss your results, treatment options, and if necessary, the need for more tests. Talk with your health care provider if you have any questions about your results. This information is not intended to replace advice given to you by your health care provider. Make sure you discuss any questions you have with your health care provider. Document Released: 05/11/2004 Document Revised: 12/22/2015 Document Reviewed: 08/07/2013 Elsevier Interactive Patient Education  2017 [employer].

## 2016-04-03 NOTE — Progress Notes (Signed)
Cardiology Office Note Date:  04/03/2016  Patient ID:  [name], [name] [birth year], MRN [id number] PCP:  Marikay Alar, MD  Cardiologist:  Dr. Kirke Corin, MD    Chief Complaint: Follow up CAD  History of Present Illness: [name] is a 60 y.o. female with history of CAD s/p NSTEMI in early June 2017 s/p PCI/DES x 2 as below to the LAD, recently diagnosed ICM/chronic systolic CHF with EF of 50% by echo June 2017, HLD, and tobacco abuse at 1ppd since age 36 who presents for routine follow up of her CAD and dental clearance.    Prior to her admission in June 2017, she did not have any previously known cardiac history. She had not seen a physician in the outpatient setting in many years and she was unaware of any medical problems upon her admission. Her only medications at time of admission were prn Tylenol and ibuprofen. She presented to [facility] on 6/4 with chest pain and was found to have a troponin that peaked at 0.24. EKG showed deeply inverted T waves in the anterior leads. She underwent cardiac cath on 6/5 that showed left main normal, ostial to proximal LAD was 90% stenosed s/p PCI/DES with Xience Alpine stent 3/23 x 15 mm with TIMI 3 flow pre-intervention and TIMI 3 flow post-intervention. Mid LAD was 80% stenosed s/p PCI/DES with Xience Alpine stent 3.0 x 18 mm with TIMI 3 flow pre-intervention and TIMI 3 flow post-intervention. D1,2,3 were angiogrpahically normal. Proximal to mid LCx 30% stenosed, OM1 and 2 with minimal irregs, OM3 normal, mid RCA 30% stenosed. LV gram showed moderately to severely reduced LVEF with an EF of 30-35% with severe hypokinesis of the mid to distal anterior, apical, and inferior walls. Moderately elevated LVEDP. Echo on 10/05/15 showed an LVEF of 50%, mild hypokinesis of the anteroseptal and anterior myocardium, LV diastolic funciton was normal. She was placed on aspirin 81 mg and Brilinta 90 mg bid for at least 12 months. LDL of 189-->Lipitor at  discharge-->143 on Lipitor and Zetia added. A1C 5.4%. At post hospital follow up she was doing well. Noting only some anxiety. She had decreased her cigarette to 2 daily. She declined cardiac rehab. She was most recently seen by Dr. Kirke Corin on 11/30/2015 and was doing well.   She comes in doing well today. No chest pain or SOB. Tolerating medications without issues. Uncertain what her heart rate has been at home. She is being seen by Dr. Benancio Deeds, her dentist, for evaluation of possible multiple dental extractions 2/2 poor dentition. Patient requests cardiac evaluation for this. She otherwise does not have any concerns at this time.    Past Medical History:  Diagnosis Date  . CAD (coronary artery disease)    a. NSTEMI 10/03/15; b. cardiac cath 10/04/15: LM nl, ost-pLAD 90% s/p PCI/DES, mLAD 80% s/p PCI/DES, mRCA 30%, D1,2,3 nl, p-mLCX 30%, OM1 &2 min irregs, OM3 normal, EF 30-35%, sev HK of mid-distal ant, apical, inf wall, mod elevated LVEDP  . HLD (hyperlipidemia)   . Ischemic cardiomyopathy    a. echo 10/05/15: EF 50%, mild HK of anteroseptal and anterior wall, nl LV diastolic fxn  . Tobacco abuse     Past Surgical History:  Procedure Laterality Date  . CARDIAC CATHETERIZATION N/A 10/04/2015   Procedure: Left Heart Cath and Coronary Angiography;  Surgeon: Iran Ouch, MD;  Location: [facility] INVASIVE CV LAB;  Service: Cardiovascular;  Laterality: N/A;  . CARDIAC CATHETERIZATION N/A 10/04/2015   Procedure: Coronary Stent Intervention;  Surgeon: Iran OuchMuhammad A Arida, MD;  Location: [facility] INVASIVE CV LAB;  Service: Cardiovascular;  Laterality: N/A;    Current Outpatient Prescriptions  Medication Sig Dispense Refill  . albuterol (PROVENTIL HFA;VENTOLIN HFA) 108 (90 Base) MCG/ACT inhaler Inhale 2 puffs into the lungs every 6 (six) hours as needed for wheezing or shortness of breath. 1 Inhaler 2  . aspirin 81 MG chewable tablet Chew 1 tablet (81 mg total) by mouth daily.    [name]. BRILINTA 90 MG TABS tablet TAKE ONE  TABLET BY MOUTH TWICE DAILY 60 tablet 3  . ezetimibe (ZETIA) 10 MG tablet Take 1 tablet (10 mg total) by mouth daily. 30 tablet 3  . lisinopril (PRINIVIL,ZESTRIL) 5 MG tablet TAKE ONE TABLET BY MOUTH ONCE DAILY 30 tablet 3  . metoprolol tartrate (LOPRESSOR) 25 MG tablet Take 0.5 tablets (12.5 mg total) by mouth 2 (two) times daily. 60 tablet 3  . omeprazole (PRILOSEC) 20 MG capsule Take 1 capsule (20 mg total) by mouth daily. 30 capsule 3  . rosuvastatin (CRESTOR) 40 MG tablet Take 1 tablet (40 mg total) by mouth daily. 30 tablet 3  . sertraline (ZOLOFT) 50 MG tablet Take 25 mg (half a tablet) by mouth daily for 7 days, then take 50 mg (one tablet) by mouth daily 30 tablet 3   No current facility-administered medications for this visit.     Allergies:   Penicillins   Social History:  The patient  reports that she has been smoking Cigarettes.  She has been smoking about 1.00 pack per day. She has never used smokeless tobacco. She reports that she drinks about 1.2 oz of alcohol per week . She reports that she does not use drugs.   Family History:  The patient's family history includes CAD in her mother; Colon cancer in her mother.  ROS:   Review of Systems  Constitutional: Negative for chills, diaphoresis, fever, malaise/fatigue and weight loss.  HENT: Negative for congestion.   Eyes: Negative for discharge and redness.  Respiratory: Negative for cough, hemoptysis, sputum production, shortness of breath and wheezing.   Cardiovascular: Negative for chest pain, palpitations, orthopnea, claudication, leg swelling and PND.  Gastrointestinal: Negative for abdominal pain, blood in stool, heartburn, melena, nausea and vomiting.  Genitourinary: Negative for hematuria.  Musculoskeletal: Negative for falls and myalgias.  Skin: Negative for rash.  Neurological: Negative for dizziness, tingling, tremors, sensory change, speech change, focal weakness, loss of consciousness and weakness.    Endo/Heme/Allergies: Does not bruise/bleed easily.  Psychiatric/Behavioral: Negative for substance abuse. The patient is not nervous/anxious.   All other systems reviewed and are negative.    PHYSICAL EXAM:  VS:  BP 102/62 (BP Location: Left Arm, Patient Position: Sitting, Cuff Size: Normal)   Pulse (!) 50   Ht 5\' 2"  (1.575 m)   Wt 171 lb 12 oz (77.9 kg)   BMI 31.41 kg/m  BMI: Body mass index is 31.41 kg/m.  Physical Exam  Constitutional: She is oriented to person, place, and time. She appears well-developed and well-nourished.  HENT:  Head: Normocephalic and atraumatic.  Mouth/Throat: She does not have dentures. No oral lesions. Abnormal dentition. Dental caries present. No dental abscesses, uvula swelling or lacerations.  Eyes: Right eye exhibits no discharge. Left eye exhibits no discharge.  Neck: Normal range of motion. No JVD present.  Cardiovascular: Regular rhythm, S1 normal, S2 normal and normal heart sounds.  Bradycardia present.  Exam reveals no distant heart sounds, no friction rub, no midsystolic click and no opening  snap.   No murmur heard. Pulmonary/Chest: Effort normal and breath sounds normal. No respiratory distress. She has no decreased breath sounds. She has no wheezes. She has no rales. She exhibits no tenderness.  Abdominal: Soft. She exhibits no distension. There is no tenderness.  Musculoskeletal: She exhibits no edema.  Neurological: She is alert and oriented to person, place, and time.  Skin: Skin is warm and dry. No cyanosis. Nails show no clubbing.  Psychiatric: She has a normal mood and affect. Her speech is normal and behavior is normal. Judgment and thought content normal.     EKG:  Was ordered and interpreted by me today. Shows sinus bradycardia, 50 bpm, no acute st/t changes   Recent Labs: 10/05/2015: BUN 17; Creatinine, Ser 1.16; Hemoglobin 13.2; Platelets 228; Potassium 3.9; Sodium 139 12/10/2015: ALT 24  10/03/2015: VLDL 33 12/10/2015: Chol/HDL  Ratio 3.9; Cholesterol, Total 225; HDL 58; LDL Calculated 143; Triglycerides 118   CrCl cannot be calculated (Patient's most recent lab result is older than the maximum 21 days allowed.).   Wt Readings from Last 3 Encounters:  04/03/16 171 lb 12 oz (77.9 kg)  03/16/16 172 lb 6.4 oz (78.2 kg)  02/09/16 177 lb 3.2 oz (80.4 kg)     Other studies reviewed: Additional studies/records reviewed today include: summarized above  ASSESSMENT AND PLAN:  1. Pre-procedural evaluation/poor dentition: Discussed with Dr. Kirke Corin, MD. Given patient had a NSTEMI s/p PCI/DES x 2 as above we would like for the patient continue DAPT for at least 12 month uninterrupted. If her dental extractions are felt to be necessary at this time per her dentist then perhaps her Brilinta could be stopped with her continuing on with ASA in the peri-operative setting, though this would only be the case if dental extraction was a must at this time. Otherwise, we would prefer to have her wait until a full 12 months of interrupted dual anti-platelet therapy has been completed. If dentist feels extracting a single tooth at a time while on DAPT would be a safe alternative this could be done while on DAPT. I will defer to dentist for that decision. No evidence of abscess on exam at this time. Patient understands this and would prefer to wait until it is safe from a cardiology standpoint to move forward. She would otherwise be cleared for dental extraction.   2. CAD: No symptoms concerning for angina at this time. Continue DAPT with ASA 81 mg daily and Brilinta 90 mg bid until at least 10/03/2016 (12 months post PCI). Change Lopressor to Coreg as below given bradycardia (asymptomatic). Continue Crestor and Zetia. Recheck lipid and liver today. No plans for further ischemic evaluation at this time.   3. ICM/chronic systolic CHF: She does not appear volume overloaded at this time. Changed Lopressor to Coreg as below for bradycardia. CHF education.     4. Bradycardia: Asymptomatic. Change Lopressor 12.5 mg bid to Coreg 3.125 mg bid. Stop lisinopril to allow for more BP room given she will note more of a drop in her BP with Coreg and less bradycardia. Monitor at home.   5. HLD: Continue Crestor and Zetia. Check lipid and liver today.   6. Tobacco abuse: Cessation is advised.   7. Anxiety: Much improved.   Disposition: F/u with md in 3 months.   Current medicines are reviewed at length with the patient today.  The patient did not have any concerns regarding medicines.  Elinor Dodge PA-C 04/03/2016 2:50 PM  [address] [place] [address] [place], [place] [address] [phone]

## 2016-04-04 ENCOUNTER — Other Ambulatory Visit: Payer: Self-pay

## 2016-04-04 DIAGNOSIS — E785 Hyperlipidemia, unspecified: Secondary | ICD-10-CM

## 2016-04-04 LAB — COMPREHENSIVE METABOLIC PANEL
A/G RATIO: 2.1 (ref 1.2–2.2)
ALT: 40 IU/L — ABNORMAL HIGH (ref 0–32)
AST: 23 IU/L (ref 0–40)
Albumin: 4.4 g/dL (ref 3.6–4.8)
Alkaline Phosphatase: 85 IU/L (ref 39–117)
BUN / CREAT RATIO: 19 (ref 12–28)
BUN: 15 mg/dL (ref 8–27)
Bilirubin Total: <0.2 mg/dL (ref 0.0–1.2)
CO2: 22 mmol/L (ref 18–29)
CREATININE: 0.8 mg/dL (ref 0.57–1.00)
Calcium: 9.2 mg/dL (ref 8.7–10.3)
Chloride: 101 mmol/L (ref 96–106)
GFR, EST AFRICAN AMERICAN: 91 mL/min/{1.73_m2} (ref >59)
GFR, EST NON AFRICAN AMERICAN: 79 mL/min/{1.73_m2} (ref >59)
GLUCOSE: 96 mg/dL (ref 65–99)
Globulin, Total: 2.1 g/dL (ref 1.5–4.5)
Potassium: 4.5 mmol/L (ref 3.5–5.2)
SODIUM: 140 mmol/L (ref 134–144)
Total Protein: 6.5 g/dL (ref 6.0–8.5)

## 2016-04-04 LAB — LIPID PANEL
CHOLESTEROL TOTAL: 168 mg/dL (ref 100–199)
Chol/HDL Ratio: 2.9 ratio units (ref 0.0–4.4)
HDL: 58 mg/dL (ref >39)
LDL CALC: 84 mg/dL (ref 0–99)
Triglycerides: 132 mg/dL (ref 0–149)
VLDL CHOLESTEROL CAL: 26 mg/dL (ref 5–40)

## 2016-04-17 ENCOUNTER — Other Ambulatory Visit: Payer: Self-pay | Admitting: Nurse Practitioner

## 2016-04-19 ENCOUNTER — Other Ambulatory Visit: Payer: Self-pay | Admitting: Nurse Practitioner

## 2016-04-19 NOTE — Telephone Encounter (Signed)
lvm asking pt to call our office back. I want to verify she is still taking zetia per refill request.

## 2016-04-20 ENCOUNTER — Other Ambulatory Visit: Payer: Self-pay

## 2016-04-20 ENCOUNTER — Telehealth: Payer: Self-pay | Admitting: Physician Assistant

## 2016-04-20 MED ORDER — EZETIMIBE 10 MG PO TABS: 10.0000 mg | ORAL_TABLET | Freq: Every day | ORAL | 3 refills | Status: DC

## 2016-04-20 NOTE — Telephone Encounter (Signed)
Pt daughter called back wanted to let us know pt is doing fine, and ask if Zetia was called in yesterday. Please advise.

## 2016-04-20 NOTE — Telephone Encounter (Signed)
Left message on machine for [name] to contact the office.

## 2016-04-20 NOTE — Telephone Encounter (Signed)
S/w pt DIL, Sondra, who states pt needs zetia refills as she only has one tablet left. She requests refills sent to [facility], [employer]. Prescription submitted and Sondra transferred to scheduling for L/L labs in February.

## 2016-05-26 ENCOUNTER — Other Ambulatory Visit: Payer: Self-pay | Admitting: Family Medicine

## 2016-06-01 ENCOUNTER — Other Ambulatory Visit: Payer: Medicare Other

## 2016-06-02 ENCOUNTER — Other Ambulatory Visit: Payer: Medicare Other

## 2016-06-13 ENCOUNTER — Other Ambulatory Visit
Admission: RE | Admit: 2016-06-13 | Discharge: 2016-06-13 | Disposition: A | Discharge status: Home / Self Care | Payer: Medicare Other | Source: Ambulatory Visit | Attending: Physician Assistant | Admitting: Physician Assistant

## 2016-06-13 DIAGNOSIS — E785 Hyperlipidemia, unspecified: Secondary | ICD-10-CM | POA: Insufficient documentation

## 2016-06-13 LAB — LIPID PANEL
CHOL/HDL RATIO: 2.4 ratio
Cholesterol: 175 mg/dL (ref 0–200)
HDL: 72 mg/dL (ref >40)
LDL CALC: 77 mg/dL (ref 0–99)
TRIGLYCERIDES: 130 mg/dL (ref <150)
VLDL: 26 mg/dL (ref 0–40)

## 2016-06-13 LAB — HEPATIC FUNCTION PANEL
ALT: 42 U/L (ref 14–54)
AST: 35 U/L (ref 15–41)
Albumin: 4 g/dL (ref 3.5–5.0)
Alkaline Phosphatase: 80 U/L (ref 38–126)
Bilirubin, Direct: <0.1 mg/dL — ABNORMAL LOW (ref 0.1–0.5)
TOTAL PROTEIN: 7.5 g/dL (ref 6.5–8.1)
Total Bilirubin: 0.6 mg/dL (ref 0.3–1.2)

## 2016-06-28 ENCOUNTER — Other Ambulatory Visit: Payer: Self-pay | Admitting: Family Medicine

## 2016-07-04 ENCOUNTER — Ambulatory Visit (INDEPENDENT_AMBULATORY_CARE_PROVIDER_SITE_OTHER): Payer: Medicare Other | Admitting: Cardiovascular Disease

## 2016-07-04 ENCOUNTER — Encounter: Payer: Self-pay | Admitting: Cardiovascular Disease

## 2016-07-04 VITALS — BP 130/76 | HR 61 | Ht 64.0 in | Wt 169.5 lb

## 2016-07-04 DIAGNOSIS — Z720 Tobacco use: Secondary | ICD-10-CM

## 2016-07-04 DIAGNOSIS — I255 Ischemic cardiomyopathy: Secondary | ICD-10-CM

## 2016-07-04 DIAGNOSIS — R05 Cough: Secondary | ICD-10-CM

## 2016-07-04 DIAGNOSIS — R059 Cough, unspecified: Secondary | ICD-10-CM

## 2016-07-04 DIAGNOSIS — I2510 Atherosclerotic heart disease of native coronary artery without angina pectoris: Secondary | ICD-10-CM

## 2016-07-04 DIAGNOSIS — E785 Hyperlipidemia, unspecified: Secondary | ICD-10-CM

## 2016-07-04 NOTE — Progress Notes (Signed)
Cardiology Office Note   Date:  07/04/2016   ID:  [name], DOB [birth year], MRN [id number]  PCP:  [name] Alar, MD  Cardiologist:   Lorine Bears, MD   Chief Complaint  Patient presents with  . other    3 month follow up. patient c/o a bad cough at night as well as leg pain. Meds reviewed verbally with patient.       History of Present Illness: [name] is a 61 y.o. female who presents for A follow-up visit regarding coronary artery disease and ischemic cardiomyopathy. She presented in June of 2017 with a small non-ST elevation myocardial infarction. She underwent cardiac catheterization which showed severe proximal and mid LAD disease. No other obstructive disease. She underwent successful angioplasty and 2 overlapped drug-eluting stent placement to the LAD without complications. Ejection fraction on LV gram was 30-35% but improved an echocardiogram before hospital discharge to 50%. She has other chronic medical conditions that include severe hyperlipidemia, essential hypertension and tobacco use. She cut down on smoking from 3 packs per day to one pack per day.  She has been doing reasonably well and denies any chest pain. She has chronic exertional dyspnea. She also complains of chronic dry cough which has been present even before her myocardial infarction. She needs dental extraction done   Past Medical History:  Diagnosis Date  . CAD (coronary artery disease)    a. NSTEMI 10/03/15; b. cardiac cath 10/04/15: LM nl, ost-pLAD 90% s/p PCI/DES, mLAD 80% s/p PCI/DES, mRCA 30%, D1,2,3 nl, p-mLCX 30%, OM1 &2 min irregs, OM3 normal, EF 30-35%, sev HK of mid-distal ant, apical, inf wall, mod elevated LVEDP  . HLD (hyperlipidemia)   . Ischemic cardiomyopathy    a. echo 10/05/15: EF 50%, mild HK of anteroseptal and anterior wall, nl LV diastolic fxn  . Tobacco abuse     Past Surgical History:  Procedure Laterality Date  . CARDIAC CATHETERIZATION N/A 10/04/2015   Procedure: Left Heart Cath and Coronary Angiography;  Surgeon: Iran Ouch, MD;  Location: [facility] INVASIVE CV LAB;  Service: Cardiovascular;  Laterality: N/A;  . CARDIAC CATHETERIZATION N/A 10/04/2015   Procedure: Coronary Stent Intervention;  Surgeon: Iran Ouch, MD;  Location: [facility] INVASIVE CV LAB;  Service: Cardiovascular;  Laterality: N/A;     Current Outpatient Prescriptions  Medication Sig Dispense Refill  . albuterol (PROVENTIL HFA;VENTOLIN HFA) 108 (90 Base) MCG/ACT inhaler Inhale 2 puffs into the lungs every 6 (six) hours as needed for wheezing or shortness of breath. 1 Inhaler 2  . aspirin 81 MG chewable tablet Chew 1 tablet (81 mg total) by mouth daily.    [name] ezetimibe (ZETIA) 10 MG tablet Take 1 tablet (10 mg total) by mouth daily. 30 tablet 3  . omeprazole (PRILOSEC) 20 MG capsule TAKE ONE CAPSULE BY MOUTH ONCE DAILY 30 capsule 3  . rosuvastatin (CRESTOR) 40 MG tablet TAKE ONE TABLET BY MOUTH ONCE DAILY 90 tablet 3  . sertraline (ZOLOFT) 50 MG tablet TAKE ONE TABLET BY MOUTH ONCE DAILY 90 tablet 0  . ticagrelor (BRILINTA) 90 MG TABS tablet Take 1 tablet (90 mg total) by mouth 2 (two) times daily. 60 tablet 5  . carvedilol (COREG) 3.125 MG tablet Take 1 tablet (3.125 mg total) by mouth 2 (two) times daily. 60 tablet 6   No current facility-administered medications for this visit.     Allergies:   Penicillins    Social History:  The patient  reports that she has been smoking  Cigarettes.  She has been smoking about 0.50 packs per day. She has never used smokeless tobacco. She reports that she does not drink alcohol or use drugs.   Family History:  The patient's family history includes CAD in her mother; Colon cancer in her mother.    ROS:  Please see the history of present illness.   Otherwise, review of systems are positive for none.   All other systems are reviewed and negative.    PHYSICAL EXAM: VS:  BP 130/76 (BP Location: Left Arm, Patient Position: Sitting, Cuff  Size: Normal)   Pulse 61   Ht 5\' 4"  (1.626 m)   Wt 169 lb 8 oz (76.9 kg)   BMI 29.09 kg/m  , BMI Body mass index is 29.09 kg/m. GEN: Well nourished, well developed, in no acute distress  HEENT: normal  Neck: no JVD, carotid bruits, or masses Cardiac: RRR; no murmurs, rubs, or gallops,no edema  Respiratory:  clear to auscultation bilaterally, normal work of breathing GI: soft, nontender, nondistended, + BS MS: no deformity or atrophy  Skin: warm and dry, no rash Neuro:  Strength and sensation are intact Psych: euthymic mood, full affect   EKG:  EKG is ordered today. EKG showed sinus rhythm with no significant ST or T wave changes.   Recent Labs: 10/05/2015: Hemoglobin 13.2; Platelets 228 04/03/2016: BUN 15; Creatinine, Ser 0.80; Potassium 4.5; Sodium 140 06/13/2016: ALT 42    Lipid Panel    Component Value Date/Time   CHOL 175 06/13/2016 1133   CHOL 168 04/03/2016 1523   TRIG 130 06/13/2016 1133   HDL 72 06/13/2016 1133   HDL 58 04/03/2016 1523   CHOLHDL 2.4 06/13/2016 1133   VLDL 26 06/13/2016 1133   LDLCALC 77 06/13/2016 1133   LDLCALC 84 04/03/2016 1523      Wt Readings from Last 3 Encounters:  07/04/16 169 lb 8 oz (76.9 kg)  04/03/16 171 lb 12 oz (77.9 kg)  03/16/16 172 lb 6.4 oz (78.2 kg)        ASSESSMENT AND PLAN:  1.  Coronary artery disease involving native coronary arteries without angina: She is overall doing very well.  Continue medical therapy.  2. Ischemic cardiomyopathy with ejection fraction of 50%. Continue treatment with  small dose carvedilol.  3. Hyperlipidemia:  Lipid profile improved significantly after switching to high-dose rosuvastatin and adding Zetia. Most recent LDL was 77.  4. Tobacco use: I again discussed with her the importance of smoking cessation.  5. Chronic cough: No improvement with use of omeprazole. She is very frustrated with this and thus I elected to refer her to pulmonary for evaluation.  6. Pre-op evaluation for  dental extractions: Given that she had myocardial infarction in 2 overlapped drug-eluting stents in the LAD, I favor that she waits one year before interrupting dual antiplatelet therapy. She can have this done in June after holding Brilinta for 5 days. Aspirin 81 mg once daily should be continued.    Disposition:   FU with me in 6 months  Signed,  Lorine Bears, MD  07/04/2016 2:12 PM    [facility]

## 2016-07-04 NOTE — Patient Instructions (Signed)
Medication Instructions:  Your physician recommends that you continue on your current medications as directed. Please refer to the Current Medication list given to you today.   Labwork: none  Testing/Procedures: none  Follow-Up: Your physician wants you to follow-up in: 6 months with Dr. Kirke Corin.  You will receive a reminder letter in the mail two months in advance. If you don't receive a letter, please call our office to schedule the follow-up appointment.   Any Other Special Instructions Will Be Listed Below (If Applicable). Referral to pulmonary for cough  You may proceed with June dental surgery. You may hold Brilinta 5 days prior to your surgery.      If you need a refill on your cardiac medications before your next appointment, please call your pharmacy.

## 2016-07-13 ENCOUNTER — Telehealth: Payer: Self-pay | Admitting: Physician Assistant

## 2016-07-13 NOTE — Telephone Encounter (Signed)
lmov to confirm patient had labs. Per Recall list.

## 2016-07-25 ENCOUNTER — Telehealth: Payer: Self-pay | Admitting: Family Medicine

## 2016-07-25 NOTE — Telephone Encounter (Signed)
Patient would like to know if there is anything she can try OTC until sees Dr. Adriana Simas for cough.

## 2016-07-25 NOTE — Telephone Encounter (Signed)
Spoke [name] patient cough isn't getting any better taking omeprazole .  Do you have any suggestions for patient she unable to sleep due to cough.  No symptoms of sinus congestion such nasal drainage , runny nose, congestion.  Per daughter in law just dry cough.    No follow up scheduled.   Last office visit 03/16/16.  Please advise.

## 2016-07-25 NOTE — Telephone Encounter (Signed)
Pt daughter in law called and stated that since she came in her cough is getting worse not better. Pt has been up the last 3 nights she has had more issues with coughing. Please advise, thank you!  Call @ [phone]

## 2016-07-25 NOTE — Telephone Encounter (Signed)
Pt is schedule with Dr. Adriana Simas on Thursday. Daughter in law would like to know if there is anything OTC that she can take until then. Please advise, thank you!

## 2016-07-25 NOTE — Telephone Encounter (Signed)
Agree that she needs appointment. Given that we are unsure what the cough is related to I am not sure there is a specific OTC medication that will be helpful.

## 2016-07-25 NOTE — Telephone Encounter (Signed)
Advise f/u appt  She needs to be seen

## 2016-07-26 NOTE — Telephone Encounter (Signed)
Tried to return call to patient voicemail is full can not contact patient, will continue to try.

## 2016-07-27 ENCOUNTER — Ambulatory Visit: Payer: Medicare Other | Admitting: Family Medicine

## 2016-07-27 NOTE — Telephone Encounter (Signed)
NOt able to each patient has seen Dr.

## 2016-08-14 NOTE — Progress Notes (Signed)
[facility] Consultation      Assessment and Plan:  The patient is a 61 year old female smoker, with coronary artery disease, referred for chronic cough.  Cough. -Possibly multifactorial, possible secondary to smoking, active GERD symptoms, active rhinitis symptoms, COPD. -The patient's omeprazole is increased to twice daily, she started on Tessalon 200 mg daily at bedtime, she started on Flonase 2 sprays in each nostril at bedtime, she is asked to cut down on her cigarette smoking. I've also asked her to empirically use her Ventolin inhaler 2 puffs at bedtime to see if this improves with her cough.  COPD/emphysema. -Seen on imaging. May be contributing to her cough, but is otherwise minimally symptomatic at this time. She has a Ventolin inhaler which she uses once a week or less. -No need for long-acting inhaler at this time as the patient is minimally symptomatic. -We'll consider pulmonary function testing once the patient's cough is improved.  Nicotine abuse. -Discussed importance of cessation, spent greater than 3 minutes in discussion of cessation.   GERD. -Continued symptoms despite treatment with omeprazole 20 mg once daily, will increase to twice daily as this may be contributing to her cough.  Chronic rhinitis. -This may also be contributing to cough, patient is asked to use Flonase 2 puffs each nostril at night.  Date: 08/14/2016  MRN# [id number] [name] 07-25-1955    [name] is a 61 y.o. old female seen in consultation for chief complaint of:    Chief Complaint  Patient presents with  . Advice Only    ref by Birdie Sons for cough: dry cough; wheezing; SOB w/activity    HPI:   The patient is a 61 yo female smoker with a history of ischemic cardiomyopathy. She has been having issues with chronic cough. This appears to be worse with laying down, she appears to improve with cough drops. She also notes she has shortness of breath  with activity.  She has had the cough for a few years, she describes it as a dry cough, it gets worse when she goes to bed and disturbs her sleep. She takes robitussin but it does not help. The cough drops help a bit. Once she falls asleep the cough will wake her up. The cough bothers her during the day also but it worse at night.   She does yard work, she does not notice trouble breathing, she is not limited by breathing. She takes 1 puff of albuterol prn, she has only had to use it one day in the past week.   Cough is non-productive. She is smoking about 2 cigs per day, and is trying to quit.  Her nose gets stopped up at night. She does have reflux, she takes omeprazole at bedtime, but she still gets reflux on most days, and it will be "real bad" if she does not take the pill.  She eats dinner at 5 pm, she eats something else around 9 pm.   I personally reviewed, images, chest x-ray 03/16/16; changes of chronic bronchitis, emphysema, with hyperinflation. No significant change when compared with previous.  PMHX:   Past Medical History:  Diagnosis Date  . CAD (coronary artery disease)    a. NSTEMI 10/03/15; b. cardiac cath 10/04/15: LM nl, ost-pLAD 90% s/p PCI/DES, mLAD 80% s/p PCI/DES, mRCA 30%, D1,2,3 nl, p-mLCX 30%, OM1 &2 min irregs, OM3 normal, EF 30-35%, sev HK of mid-distal ant, apical, inf wall, mod elevated LVEDP  . HLD (hyperlipidemia)   . Ischemic cardiomyopathy  a. echo 10/05/15: EF 50%, mild HK of anteroseptal and anterior wall, nl LV diastolic fxn  . Tobacco abuse    Surgical Hx:  Past Surgical History:  Procedure Laterality Date  . CARDIAC CATHETERIZATION N/A 10/04/2015   Procedure: Left Heart Cath and Coronary Angiography;  Surgeon: Iran Ouch, MD;  Location: [facility] INVASIVE CV LAB;  Service: Cardiovascular;  Laterality: N/A;  . CARDIAC CATHETERIZATION N/A 10/04/2015   Procedure: Coronary Stent Intervention;  Surgeon: Iran Ouch, MD;  Location: [facility] INVASIVE CV LAB;   Service: Cardiovascular;  Laterality: N/A;   Family Hx:  Family History  Problem Relation Age of Onset  . CAD Mother   . Colon cancer Mother    Social Hx:   Social History  Substance Use Topics  . Smoking status: Current Every Day Smoker    Packs/day: 0.50    Types: Cigarettes  . Smokeless tobacco: Never Used     Comment: for 50 years now  . Alcohol use No   Medication:   Reviewed; no ACE inhibitor.   Allergies:  Penicillins  Review of Systems: Gen:  Denies  fever, sweats, chills HEENT: Denies blurred vision, double vision. bleeds, sore throat Cvc:  No dizziness, chest pain. Resp:   Denies cough or sputum production, shortness of breath Gi: Denies swallowing difficulty, stomach pain. Gu:  Denies bladder incontinence, burning urine Ext:   No Joint pain, stiffness. Skin: No skin rash,  hives  Endoc:  No polyuria, polydipsia. Psych: No depression, insomnia. Other:  All other systems were reviewed with the patient and were negative other that what is mentioned in the HPI.   Physical Examination:   VS: BP 138/88 (BP Location: Left Arm, Cuff Size: Normal)   Pulse 70   Wt 170 lb (77.1 kg)   SpO2 96%   BMI 29.18 kg/m   General Appearance: No distress  Neuro:without focal findings,  speech normal,  HEENT: PERRLA, EOM intact.   Pulmonary: normal breath sounds, No wheezing.  CardiovascularNormal S1,S2.  No m/r/g.   Abdomen: Benign, Soft, non-tender. Renal:  No costovertebral tenderness  GU:  No performed at this time. Endoc: No evident thyromegaly, no signs of acromegaly. Skin:   warm, no rashes, no ecchymosis  Extremities: normal, no cyanosis, clubbing.  Other findings:    LABORATORY PANEL:   CBC No results for input(s): WBC, HGB, HCT, PLT in the last 168 hours. ------------------------------------------------------------------------------------------------------------------  Chemistries  No results for input(s): NA, K, CL, CO2, GLUCOSE, BUN, CREATININE,  CALCIUM, MG, AST, ALT, ALKPHOS, BILITOT in the last 168 hours.  Invalid input(s): GFRCGP ------------------------------------------------------------------------------------------------------------------  Cardiac Enzymes No results for input(s): TROPONINI in the last 168 hours. ------------------------------------------------------------  RADIOLOGY:  No results found.     Thank  you for the consultation and for allowing [facility] to assist in the care of your patient. Our recommendations are noted above.  Please contact us if we can be of further service.   Wells Guiles, MD.  Board Certified in Internal Medicine, Pulmonary Medicine, Critical Care Medicine, and Sleep Medicine.  [place] [facility] Office Number: [phone]  Santiago Glad, M.D.  Billy Fischer, M.D  08/14/2016

## 2016-08-15 ENCOUNTER — Other Ambulatory Visit: Payer: Self-pay | Admitting: Cardiovascular Disease

## 2016-08-15 ENCOUNTER — Ambulatory Visit (INDEPENDENT_AMBULATORY_CARE_PROVIDER_SITE_OTHER): Payer: Medicare Other | Admitting: Internal Medicine

## 2016-08-15 ENCOUNTER — Encounter: Payer: Self-pay | Admitting: Internal Medicine

## 2016-08-15 ENCOUNTER — Telehealth: Payer: Self-pay | Admitting: Cardiovascular Disease

## 2016-08-15 VITALS — BP 138/88 | HR 70 | Wt 170.0 lb

## 2016-08-15 DIAGNOSIS — I255 Ischemic cardiomyopathy: Secondary | ICD-10-CM | POA: Diagnosis not present

## 2016-08-15 DIAGNOSIS — R059 Cough, unspecified: Secondary | ICD-10-CM

## 2016-08-15 DIAGNOSIS — F17210 Nicotine dependence, cigarettes, uncomplicated: Secondary | ICD-10-CM | POA: Diagnosis not present

## 2016-08-15 DIAGNOSIS — R05 Cough: Secondary | ICD-10-CM

## 2016-08-15 DIAGNOSIS — J438 Other emphysema: Secondary | ICD-10-CM

## 2016-08-15 DIAGNOSIS — Z720 Tobacco use: Secondary | ICD-10-CM

## 2016-08-15 MED ORDER — FLUTICASONE PROPIONATE 50 MCG/ACT NA SUSP: 2.0000 | Freq: Every day | NASAL | 5 refills | Status: DC

## 2016-08-15 MED ORDER — EZETIMIBE 10 MG PO TABS: 10.0000 mg | ORAL_TABLET | Freq: Every day | ORAL | 2 refills | Status: DC

## 2016-08-15 MED ORDER — OMEPRAZOLE 20 MG PO CPDR: 20.0000 mg | DELAYED_RELEASE_CAPSULE | Freq: Two times a day (BID) | ORAL | 5 refills | Status: DC

## 2016-08-15 MED ORDER — BENZONATATE 200 MG PO CAPS: 200.0000 mg | ORAL_CAPSULE | Freq: Every day | ORAL | 1 refills | Status: DC

## 2016-08-15 NOTE — Patient Instructions (Addendum)
--  Start flonase 2 sprays in each nostril at bed time.   --Increase omeprazole to twice daily. Will prescribe.   ----Quitting smoking is the most important thing that you can do for your health.  --Quitting smoking will have greater affect on your health than any medicine that we can give you.   --Try taking your albuterol inhaler 2 puffs at night every night for one week.   --Will start tessalon 200 mg every night for 2 months.   --Follow up in 3 months, but if no improvement in next 2 weeks call back for earlier appointment.

## 2016-08-15 NOTE — Telephone Encounter (Signed)
°*  STAT* If patient is at the pharmacy, call can be transferred to refill team.   1. Which medications need to be refilled? (please list name of each medication and dose if known)  Ezetimibe 10 mg po daily   2. Which pharmacy/location (including street and city if local pharmacy) is medication to be sent to?  [facility] [address] [place]   3. Do they need a 30 day or 90 day supply? 90 Patient is out of medicine

## 2016-08-15 NOTE — Telephone Encounter (Signed)
Refill sent.

## 2016-09-19 ENCOUNTER — Telehealth: Payer: Self-pay | Admitting: Family Medicine

## 2016-09-19 NOTE — Telephone Encounter (Signed)
Called both home and cell numbers for pt to schedule AWV. No voicemail set up on either phone.  °

## 2016-09-29 ENCOUNTER — Other Ambulatory Visit: Payer: Self-pay | Admitting: Family Medicine

## 2016-10-31 ENCOUNTER — Other Ambulatory Visit: Payer: Self-pay | Admitting: Physician Assistant

## 2016-10-31 NOTE — Telephone Encounter (Signed)
Called both home and cell numbers for pt to schedule AWV. No voicemail set up on either phone.

## 2016-11-30 NOTE — Progress Notes (Deleted)
[facility] Consultation      Assessment and Plan:  The patient is a 61 year old female smoker, with coronary artery disease, referred for chronic cough.  Cough. -Possibly multifactorial, possible secondary to smoking, active GERD symptoms, active rhinitis symptoms, COPD. -The patient's omeprazole is increased to twice daily, she started on Tessalon 200 mg daily at bedtime, she started on Flonase 2 sprays in each nostril at bedtime, she is asked to cut down on her cigarette smoking. I've also asked her to empirically use her Ventolin inhaler 2 puffs at bedtime to see if this improves with her cough.  COPD/emphysema. -Seen on imaging. May be contributing to her cough, but is otherwise minimally symptomatic at this time. She has a Ventolin inhaler which she uses once a week or less. -No need for long-acting inhaler at this time as the patient is minimally symptomatic. -We'll consider pulmonary function testing once the patient's cough is improved.  Nicotine abuse. -Discussed importance of cessation, spent greater than 3 minutes in discussion of cessation.   GERD. -Continued symptoms despite treatment with omeprazole 20 mg once daily, will increase to twice daily as this may be contributing to her cough.  Chronic rhinitis. -This may also be contributing to cough, patient is asked to use Flonase 2 puffs each nostril at night.  Date: 11/30/2016  MRN# [id number] [name] Aug 16, 1955    [name] is a 61 y.o. old female seen in consultation for chief complaint of:    No chief complaint on file.   HPI:   The patient is a 61 yo female smoker with a history of ischemic cardiomyopathy. She has been having issues with chronic cough. This appears to be worse with laying down, she appears to improve with cough drops. She also notes she has shortness of breath with activity.  At last visit she was noted to have had a cough for a few years, describes it as a  dry cough, it gets worse when she goes to bed and disturbs her sleep. She takes robitussin but it does not help. The cough drops help a bit. Once she falls asleep the cough will wake her up. The cough bothers her during the day also but it worse at night.   She was asked to use flonase, ventolin, stop smoking, and increase omeprazole to bid. She has minimal dyspnea therefore a long acting inhaler was not prescribed.   Cough is non-productive. She is smoking about 2 cigs per day, and is trying to quit.  Her nose gets stopped up at night. She does have reflux, she takes omeprazole at bedtime, but she still gets reflux on most days, and it will be "real bad" if she does not take the pill.  She eats dinner at 5 pm, she eats something else around 9 pm.   I personally reviewed, images, chest x-ray 03/16/16; changes of chronic bronchitis, emphysema, with hyperinflation. No significant change when compared with previous.  PMHX:   Past Medical History:  Diagnosis Date  . CAD (coronary artery disease)    a. NSTEMI 10/03/15; b. cardiac cath 10/04/15: LM nl, ost-pLAD 90% s/p PCI/DES, mLAD 80% s/p PCI/DES, mRCA 30%, D1,2,3 nl, p-mLCX 30%, OM1 &2 min irregs, OM3 normal, EF 30-35%, sev HK of mid-distal ant, apical, inf wall, mod elevated LVEDP  . HLD (hyperlipidemia)   . Ischemic cardiomyopathy    a. echo 10/05/15: EF 50%, mild HK of anteroseptal and anterior wall, nl LV diastolic fxn  . Tobacco abuse  Surgical Hx:  Past Surgical History:  Procedure Laterality Date  . CARDIAC CATHETERIZATION N/A 10/04/2015   Procedure: Left Heart Cath and Coronary Angiography;  Surgeon: Iran Ouch, MD;  Location: [facility] INVASIVE CV LAB;  Service: Cardiovascular;  Laterality: N/A;  . CARDIAC CATHETERIZATION N/A 10/04/2015   Procedure: Coronary Stent Intervention;  Surgeon: Iran Ouch, MD;  Location: [facility] INVASIVE CV LAB;  Service: Cardiovascular;  Laterality: N/A;   Family Hx:  Family History  Problem Relation Age of  Onset  . CAD Mother   . Colon cancer Mother    Social Hx:   Social History  Substance Use Topics  . Smoking status: Current Every Day Smoker    Packs/day: 0.50    Types: Cigarettes  . Smokeless tobacco: Never Used     Comment: for 50 years now  . Alcohol use No   Medication:   Reviewed; no ACE inhibitor.   Allergies:  Penicillins  Review of Systems: Gen:  Denies  fever, sweats, chills HEENT: Denies blurred vision, double vision. bleeds, sore throat Cvc:  No dizziness, chest pain. Resp:   Denies cough or sputum production, shortness of breath Gi: Denies swallowing difficulty, stomach pain. Gu:  Denies bladder incontinence, burning urine Ext:   No Joint pain, stiffness. Skin: No skin rash,  hives  Endoc:  No polyuria, polydipsia. Psych: No depression, insomnia. Other:  All other systems were reviewed with the patient and were negative other that what is mentioned in the HPI.   Physical Examination:   VS: There were no vitals taken for this visit.  General Appearance: No distress  Neuro:without focal findings,  speech normal,  HEENT: PERRLA, EOM intact.   Pulmonary: normal breath sounds, No wheezing.  CardiovascularNormal S1,S2.  No m/r/g.   Abdomen: Benign, Soft, non-tender. Renal:  No costovertebral tenderness  GU:  No performed at this time. Endoc: No evident thyromegaly, no signs of acromegaly. Skin:   warm, no rashes, no ecchymosis  Extremities: normal, no cyanosis, clubbing.  Other findings:    LABORATORY PANEL:   CBC No results for input(s): WBC, HGB, HCT, PLT in the last 168 hours. ------------------------------------------------------------------------------------------------------------------  Chemistries  No results for input(s): NA, K, CL, CO2, GLUCOSE, BUN, CREATININE, CALCIUM, MG, AST, ALT, ALKPHOS, BILITOT in the last 168 hours.  Invalid input(s):  GFRCGP ------------------------------------------------------------------------------------------------------------------  Cardiac Enzymes No results for input(s): TROPONINI in the last 168 hours. ------------------------------------------------------------  RADIOLOGY:  No results found.     Thank  you for the consultation and for allowing [facility], [facility] Care to assist in the care of your patient. Our recommendations are noted above.  Please contact us if we can be of further service.   Wells Guiles, MD.  Board Certified in Internal Medicine, Pulmonary Medicine, Critical Care Medicine, and Sleep Medicine.  [place] [facility] Office Number: [phone]  Santiago Glad, M.D.  Billy Fischer, M.D  11/30/2016

## 2016-12-04 ENCOUNTER — Ambulatory Visit: Payer: Medicare Other | Admitting: Internal Medicine

## 2016-12-15 NOTE — Progress Notes (Signed)
[facility] [place] [facility] Consultation      Assessment and Plan:  The patient is a 61 year old female smoker, with coronary artery disease, referred for chronic cough.  Cough. -Possibly multifactorial, possible secondary to smoking, active GERD symptoms,  COPD. -Now doing significantly better since increasing omeprazole to twice a day, will continue.  COPD/emphysema. -Seen on imaging. May be contributing to her cough, but is otherwise mildly symptomatic at this time. She has a Ventolin inhaler which she uses once a day or less. -No need for long-acting inhaler at this time as the patient is minimally symptomatic.   Nicotine abuse. -Discussed importance of cessation, spent greater than 3 minutes in discussion of cessation.   GERD. -Continued symptoms despite treatment with omeprazole 20 mg once daily, will increase to twice daily as this may be contributing to her cough.  Chronic rhinitis. -This may also be contributing to cough, patient is asked to use Flonase 2 puffs each nostril at night.  Date: 12/15/2016  MRN# [id number] [name] [birth year]    [name] is a 61 y.o. old female seen in consultation for chief complaint of:    Chief Complaint  Patient presents with  . Follow-up    patient reports cough better since increasing Omeprazole; pt has d/c Flonase did not like it.  . Emphysema    pt still smoking    HPI:   The patient is a 61 yo female smoker with a history of ischemic cardiomyopathy. She has been having issues with chronic cough. This appears to be worse with laying down, she appears to improve with cough drops. She also notes she has shortness of breath with activity.  At last visit she was noted to have had a cough for a few years, with severe symptoms of reflux. She was asked to use flonase, ventolin, stop smoking, and increase omeprazole to bid.  She notes that she "feels like a new person" her breathing is much better. She is taking  tessalon 1 tablet at bedtime. She is taking proventil about once per day, usually at night. She stopped the nasal spray because she did not like it. She is doing"pretty good" with smoking, she is down to about half ppd.   **chest x-ray 03/16/16; changes of chronic bronchitis, emphysema, with hyperinflation. No significant change when compared with previous.  Medication:    Current Outpatient Prescriptions:  .  albuterol (PROVENTIL HFA;VENTOLIN HFA) 108 (90 Base) MCG/ACT inhaler, Inhale 2 puffs into the lungs every 6 (six) hours as needed for wheezing or shortness of breath., Disp: 1 Inhaler, Rfl: 2 .  aspirin 81 MG chewable tablet, Chew 1 tablet (81 mg total) by mouth daily., Disp: , Rfl:  .  benzonatate (TESSALON) 200 MG capsule, Take 1 capsule (200 mg total) by mouth at bedtime., Disp: 30 capsule, Rfl: 1 .  carvedilol (COREG) 3.125 MG tablet, TAKE ONE TABLET BY MOUTH TWICE DAILY, Disp: 60 tablet, Rfl: 6 .  ezetimibe (ZETIA) 10 MG tablet, Take 1 tablet (10 mg total) by mouth daily., Disp: 90 tablet, Rfl: 2 .  omeprazole (PRILOSEC) 20 MG capsule, TAKE ONE CAPSULE BY MOUTH ONCE DAILY (Patient taking differently: TAKE ONE CAPSULE BY MOUTH  TWICE DAILY), Disp: 30 capsule, Rfl: 3 .  rosuvastatin (CRESTOR) 40 MG tablet, TAKE ONE TABLET BY MOUTH ONCE DAILY, Disp: 90 tablet, Rfl: 3 .  sertraline (ZOLOFT) 50 MG tablet, TAKE 1 TABLET BY MOUTH ONCE DAILY. APPOINTMENT NEEDED FOR FURTHER REFILLS, Disp: 90 tablet, Rfl: 0 .  ticagrelor (BRILINTA)  90 MG TABS tablet, Take 1 tablet (90 mg total) by mouth 2 (two) times daily., Disp: 60 tablet, Rfl: 5   Allergies:  Penicillins  Review of Systems: Gen:  Denies  fever, sweats, chills HEENT: Denies blurred vision, double vision. bleeds, sore throat Cvc:  No dizziness, chest pain. Resp:   Denies cough or sputum production, shortness of breath Gi: Denies swallowing difficulty, stomach pain. Gu:  Denies bladder incontinence, burning urine Ext:   No Joint pain,  stiffness. Skin: No skin rash,  hives  Endoc:  No polyuria, polydipsia. Psych: No depression, insomnia. Other:  All other systems were reviewed with the patient and were negative other that what is mentioned in the HPI.   Physical Examination:   VS: BP 132/90 (BP Location: Left Arm, Cuff Size: Normal)   Pulse 64   Resp 16   Ht 5\' 4"  (1.626 m)   Wt 170 lb (77.1 kg)   SpO2 99%   BMI 29.18 kg/m   General Appearance: No distress  Neuro:without focal findings,  speech normal,  HEENT: PERRLA, EOM intact.   Pulmonary: normal breath sounds, No wheezing.  CardiovascularNormal S1,S2.  No m/r/g.   Abdomen: Benign, Soft, non-tender. Renal:  No costovertebral tenderness  GU:  No performed at this time. Endoc: No evident thyromegaly, no signs of acromegaly. Skin:   warm, no rashes, no ecchymosis  Extremities: normal, no cyanosis, clubbing.  Other findings:    LABORATORY PANEL:   CBC No results for input(s): WBC, HGB, HCT, PLT in the last 168 hours. ------------------------------------------------------------------------------------------------------------------  Chemistries  No results for input(s): NA, K, CL, CO2, GLUCOSE, BUN, CREATININE, CALCIUM, MG, AST, ALT, ALKPHOS, BILITOT in the last 168 hours.  Invalid input(s): GFRCGP ------------------------------------------------------------------------------------------------------------------  Cardiac Enzymes No results for input(s): TROPONINI in the last 168 hours. ------------------------------------------------------------  RADIOLOGY:  No results found.     Thank  you for the consultation and for allowing [facility], [facility] Care to assist in the care of your patient. Our recommendations are noted above.  Please contact us if we can be of further service.   Wells Guiles, MD.  Board Certified in Internal Medicine, Pulmonary Medicine, Critical Care Medicine, and Sleep Medicine.  [place] [facility] Office Number: [phone]  Santiago Glad, M.D.  Billy Fischer, M.D  12/15/2016

## 2016-12-19 ENCOUNTER — Encounter: Payer: Self-pay | Admitting: Internal Medicine

## 2016-12-19 ENCOUNTER — Ambulatory Visit (INDEPENDENT_AMBULATORY_CARE_PROVIDER_SITE_OTHER): Payer: Medicare Other | Admitting: Internal Medicine

## 2016-12-19 VITALS — BP 132/90 | HR 64 | Resp 16 | Ht 64.0 in | Wt 170.0 lb

## 2016-12-19 DIAGNOSIS — J438 Other emphysema: Secondary | ICD-10-CM

## 2016-12-19 DIAGNOSIS — Z720 Tobacco use: Secondary | ICD-10-CM

## 2016-12-19 DIAGNOSIS — F17210 Nicotine dependence, cigarettes, uncomplicated: Secondary | ICD-10-CM | POA: Diagnosis not present

## 2016-12-19 DIAGNOSIS — I255 Ischemic cardiomyopathy: Secondary | ICD-10-CM

## 2016-12-19 NOTE — Patient Instructions (Signed)
Continue your current medications.   --Quitting smoking is the most important thing that you can do for your health.  --Quitting smoking will have greater affect on your health than any medicine that we can give you.

## 2017-01-02 ENCOUNTER — Telehealth: Payer: Self-pay | Admitting: Cardiovascular Disease

## 2017-01-02 ENCOUNTER — Encounter: Payer: Self-pay | Admitting: Cardiovascular Disease

## 2017-01-02 ENCOUNTER — Other Ambulatory Visit: Payer: Self-pay | Admitting: *Deleted

## 2017-01-02 ENCOUNTER — Ambulatory Visit (INDEPENDENT_AMBULATORY_CARE_PROVIDER_SITE_OTHER): Payer: Medicare Other | Admitting: Cardiovascular Disease

## 2017-01-02 VITALS — BP 130/82 | HR 64 | Ht 64.0 in | Wt 170.0 lb

## 2017-01-02 DIAGNOSIS — Z720 Tobacco use: Secondary | ICD-10-CM | POA: Diagnosis not present

## 2017-01-02 DIAGNOSIS — I2510 Atherosclerotic heart disease of native coronary artery without angina pectoris: Secondary | ICD-10-CM | POA: Diagnosis not present

## 2017-01-02 DIAGNOSIS — I255 Ischemic cardiomyopathy: Secondary | ICD-10-CM

## 2017-01-02 DIAGNOSIS — E785 Hyperlipidemia, unspecified: Secondary | ICD-10-CM

## 2017-01-02 NOTE — Telephone Encounter (Signed)
Pt's pharmacy has been changed to [facility] pharmacy per pt request.

## 2017-01-02 NOTE — Progress Notes (Signed)
Cardiology Office Note   Date:  01/02/2017   ID:  [name], DOB 07-Jun-1955, MRN [id number]  PCP:  Glori Luis, MD  Cardiologist:   Lorine Bears, MD   Chief Complaint  Patient presents with  . other    6 month follow up. Meds reviewed by the pt's meds list. "doing well." Pt. needs an okay to have teeth pulled.       History of Present Illness: [name] is a 61 y.o. female who presents for a follow-up visit regarding coronary artery disease and ischemic cardiomyopathy. She presented in June of 2017 with a small non-ST elevation myocardial infarction. She underwent cardiac catheterization which showed severe proximal and mid LAD disease. No other obstructive disease. She underwent successful angioplasty and 2 overlapped drug-eluting stent placement to the LAD without complications. Ejection fraction on LV gram was 30-35% but improved an echocardiogram before hospital discharge to 50%. She has other chronic medical conditions that include severe hyperlipidemia, essential hypertension and tobacco use.  I referred her to pulmonary for persistent cough and she was diagnosed with COPD. Cough improved significantly with treatment. She also cut down on smoking and almost completely quit. She smokes one cigarette every now and then but not on a regular basis. She used to smoke 3 packs per day.  She denies chest pain. She is walking regularly for exercise. She needs dental extraction done.  Past Medical History:  Diagnosis Date  . CAD (coronary artery disease)    a. NSTEMI 10/03/15; b. cardiac cath 10/04/15: LM nl, ost-pLAD 90% s/p PCI/DES, mLAD 80% s/p PCI/DES, mRCA 30%, D1,2,3 nl, p-mLCX 30%, OM1 &2 min irregs, OM3 normal, EF 30-35%, sev HK of mid-distal ant, apical, inf wall, mod elevated LVEDP  . HLD (hyperlipidemia)   . Ischemic cardiomyopathy    a. echo 10/05/15: EF 50%, mild HK of anteroseptal and anterior wall, nl LV diastolic fxn  . Tobacco abuse     Past  Surgical History:  Procedure Laterality Date  . CARDIAC CATHETERIZATION N/A 10/04/2015   Procedure: Left Heart Cath and Coronary Angiography;  Surgeon: Iran Ouch, MD;  Location: [facility] INVASIVE CV LAB;  Service: Cardiovascular;  Laterality: N/A;  . CARDIAC CATHETERIZATION N/A 10/04/2015   Procedure: Coronary Stent Intervention;  Surgeon: Iran Ouch, MD;  Location: [facility] INVASIVE CV LAB;  Service: Cardiovascular;  Laterality: N/A;     Current Outpatient Prescriptions  Medication Sig Dispense Refill  . albuterol (PROVENTIL HFA;VENTOLIN HFA) 108 (90 Base) MCG/ACT inhaler Inhale 2 puffs into the lungs every 6 (six) hours as needed for wheezing or shortness of breath. 1 Inhaler 2  . aspirin 81 MG chewable tablet Chew 1 tablet (81 mg total) by mouth daily.    . benzonatate (TESSALON) 200 MG capsule Take 1 capsule (200 mg total) by mouth at bedtime. 30 capsule 1  . carvedilol (COREG) 3.125 MG tablet TAKE ONE TABLET BY MOUTH TWICE DAILY 60 tablet 6  . ezetimibe (ZETIA) 10 MG tablet Take 1 tablet (10 mg total) by mouth daily. 90 tablet 2  . omeprazole (PRILOSEC) 20 MG capsule TAKE ONE CAPSULE BY MOUTH ONCE DAILY (Patient taking differently: TAKE ONE CAPSULE BY MOUTH  TWICE DAILY) 30 capsule 3  . rosuvastatin (CRESTOR) 40 MG tablet TAKE ONE TABLET BY MOUTH ONCE DAILY 90 tablet 3   No current facility-administered medications for this visit.     Allergies:   Penicillins    Social History:  The patient  reports that she has  been smoking Cigarettes.  She has been smoking about 0.50 packs per day. She has never used smokeless tobacco. She reports that she does not drink alcohol or use drugs.   Family History:  The patient's family history includes CAD in her mother; Colon cancer in her mother.    ROS:  Please see the history of present illness.   Otherwise, review of systems are positive for none.   All other systems are reviewed and negative.    PHYSICAL EXAM: VS:  BP 130/82 (BP Location:  Left Arm, Patient Position: Sitting, Cuff Size: Normal)   Pulse 64   Ht 5\' 4"  (1.626 m)   Wt 170 lb (77.1 kg)   BMI 29.18 kg/m  , BMI Body mass index is 29.18 kg/m. GEN: Well nourished, well developed, in no acute distress  HEENT: normal  Neck: no JVD, carotid bruits, or masses Cardiac: RRR; no murmurs, rubs, or gallops,no edema  Respiratory:  clear to auscultation bilaterally, normal work of breathing GI: soft, nontender, nondistended, + BS MS: no deformity or atrophy  Skin: warm and dry, no rash Neuro:  Strength and sensation are intact Psych: euthymic mood, full affect   EKG:  EKG is ordered today. EKG showed sinus rhythm with no significant ST or T wave changes.   Recent Labs: 04/03/2016: BUN 15; Creatinine, Ser 0.80; Potassium 4.5; Sodium 140 06/13/2016: ALT 42    Lipid Panel    Component Value Date/Time   CHOL 175 06/13/2016 1133   CHOL 168 04/03/2016 1523   TRIG 130 06/13/2016 1133   HDL 72 06/13/2016 1133   HDL 58 04/03/2016 1523   CHOLHDL 2.4 06/13/2016 1133   VLDL 26 06/13/2016 1133   LDLCALC 77 06/13/2016 1133   LDLCALC 84 04/03/2016 1523      Wt Readings from Last 3 Encounters:  01/02/17 170 lb (77.1 kg)  12/19/16 170 lb (77.1 kg)  08/15/16 170 lb (77.1 kg)        ASSESSMENT AND PLAN:  1.  Coronary artery disease involving native coronary arteries without angina: She is overall doing very well.  Continue medical therapy.It has been more than one year since PCI and drug-eluting stent placement. I elected to discontinue Brilinta. Continue aspirin indefinitely.  2. Ischemic cardiomyopathy with ejection fraction of 50%. Continue treatment with  small dose carvedilol.  3. Hyperlipidemia:  Lipid profile improved significantly after switching to high-dose rosuvastatin and adding Zetia. Most recent LDL was 77.  4. Tobacco use: Significant improvement and currently smokes an occasional cigarette every now and then.  5. Pre-op evaluation for dental  extractions:  Brilinta was discontinued today and she can proceed after 5 days. No need for antibiotic prophylaxis. Aspirin should not be interrupted.   Disposition:   FU with me in 6 months  Signed,  Lorine Bears, MD  01/02/2017 3:05 PM    [facility]

## 2017-01-02 NOTE — Telephone Encounter (Signed)
Patient changing pharmacy to [facility] .

## 2017-01-02 NOTE — Patient Instructions (Signed)
Medication Instructions:  Your physician has recommended you make the following change in your medication:  STOP taking Brilinta   Labwork: none  Testing/Procedures: none  Follow-Up: Your physician wants you to follow-up in: 6 months with Dr. Kirke Corin.  You will receive a reminder letter in the mail two months in advance. If you don't receive a letter, please call our office to schedule the follow-up appointment.   Any Other Special Instructions Will Be Listed Below (If Applicable). You may proceed with dental extractions 1 week after stopping Brilinta     If you need a refill on your cardiac medications before your next appointment, please call your pharmacy.

## 2017-01-31 ENCOUNTER — Other Ambulatory Visit: Payer: Self-pay | Admitting: Cardiovascular Disease

## 2017-03-02 ENCOUNTER — Other Ambulatory Visit: Payer: Self-pay | Admitting: Internal Medicine

## 2017-03-20 ENCOUNTER — Other Ambulatory Visit: Payer: Self-pay | Admitting: Internal Medicine

## 2017-03-20 NOTE — Telephone Encounter (Signed)
Please advise if we can fill Tessalon Pearles.

## 2017-04-06 ENCOUNTER — Other Ambulatory Visit: Payer: Self-pay | Admitting: Cardiovascular Disease

## 2017-05-31 ENCOUNTER — Other Ambulatory Visit: Payer: Self-pay | Admitting: Internal Medicine

## 2017-06-19 DIAGNOSIS — H25813 Combined forms of age-related cataract, bilateral: Secondary | ICD-10-CM | POA: Diagnosis not present

## 2017-07-20 ENCOUNTER — Ambulatory Visit: Payer: Medicare Other | Admitting: Cardiovascular Disease

## 2017-08-01 ENCOUNTER — Other Ambulatory Visit: Payer: Self-pay | Admitting: Cardiovascular Disease

## 2017-08-01 IMAGING — CR DG CHEST 2V
1 series · 2 of 2 positions shown · non-contrast
Comparison: None

CLINICAL DATA: Cough and congestion for 1 week

EXAM:
CHEST  2 VIEW

[Series 1: dg chest 2 view · 0.14mm/px · 2 of 2 slices shown]
[im 1/2]
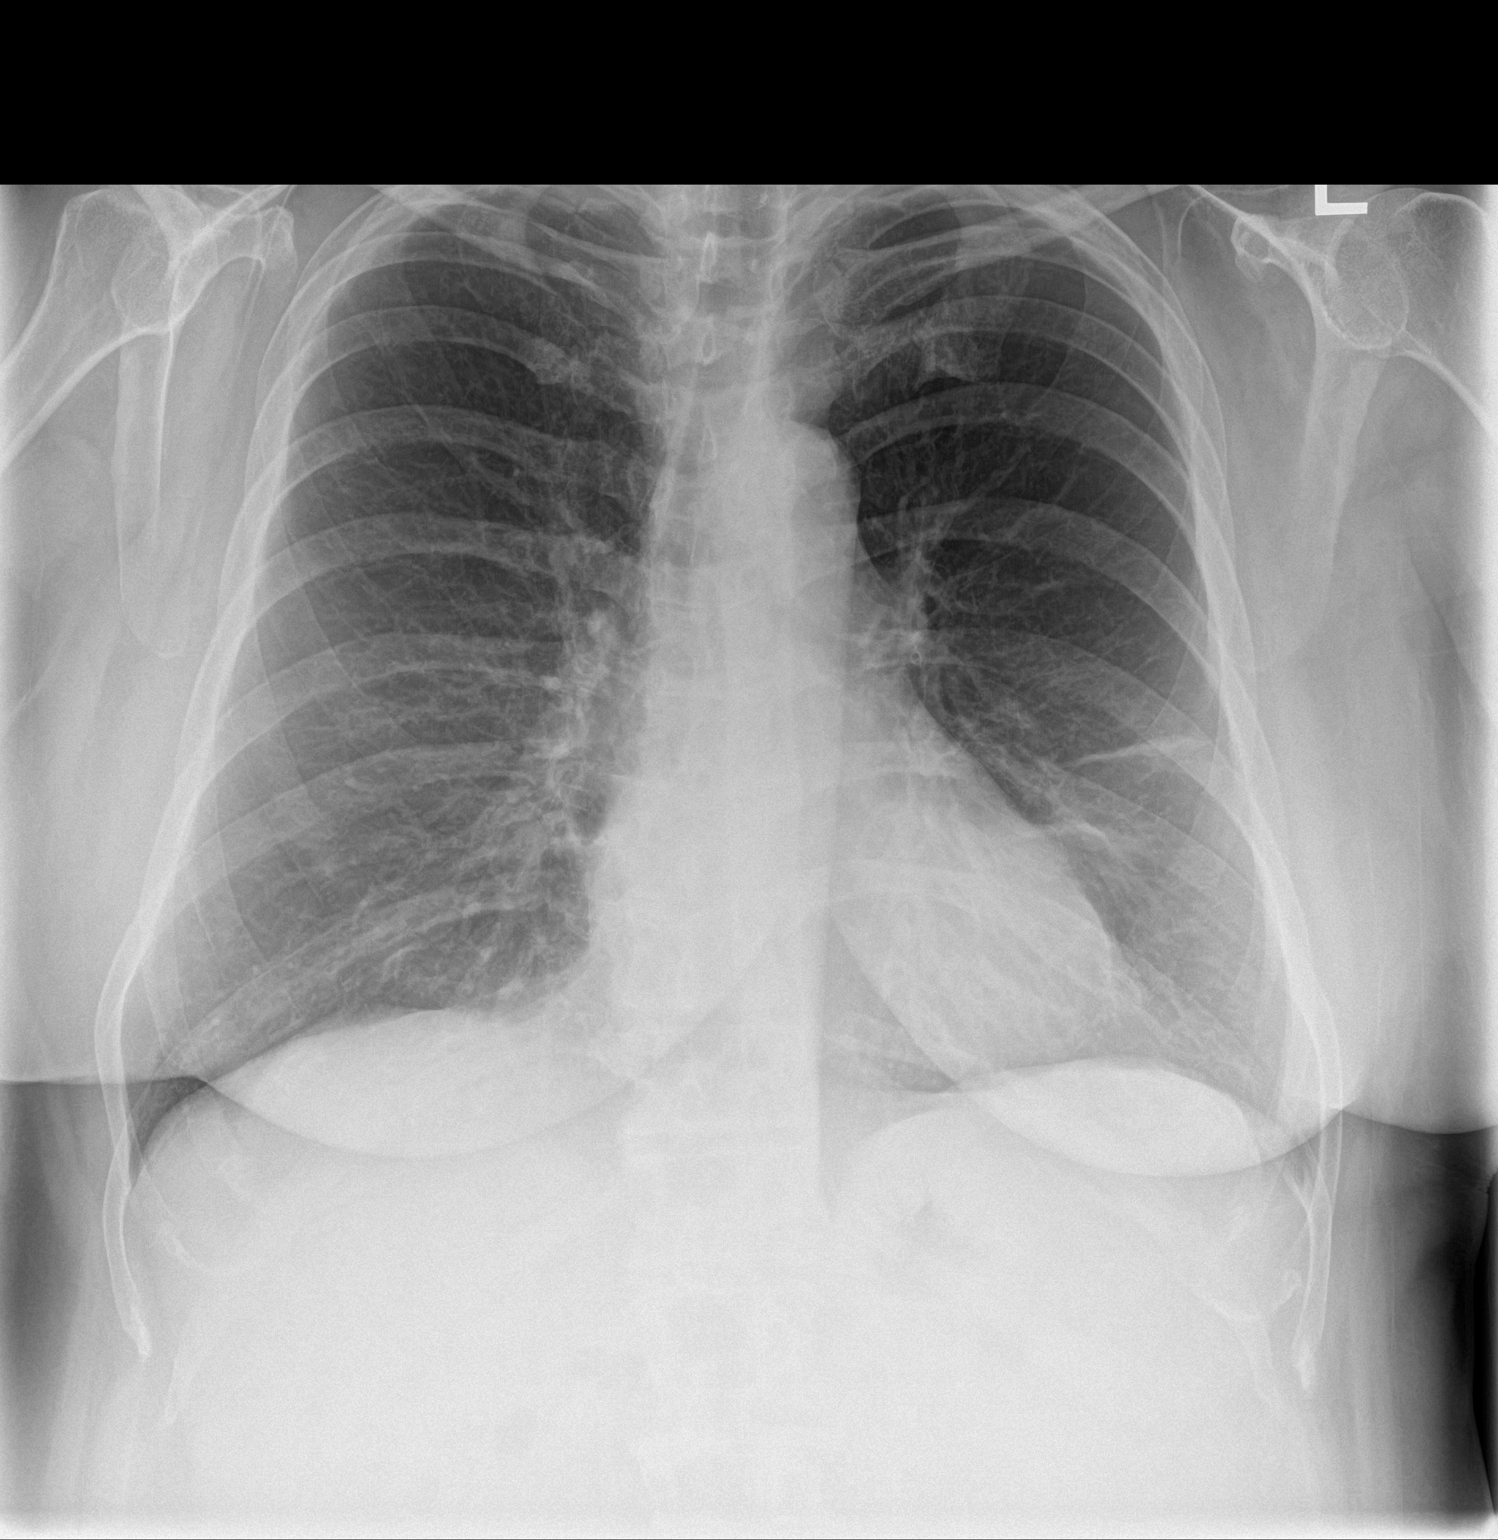
[im 2/2]
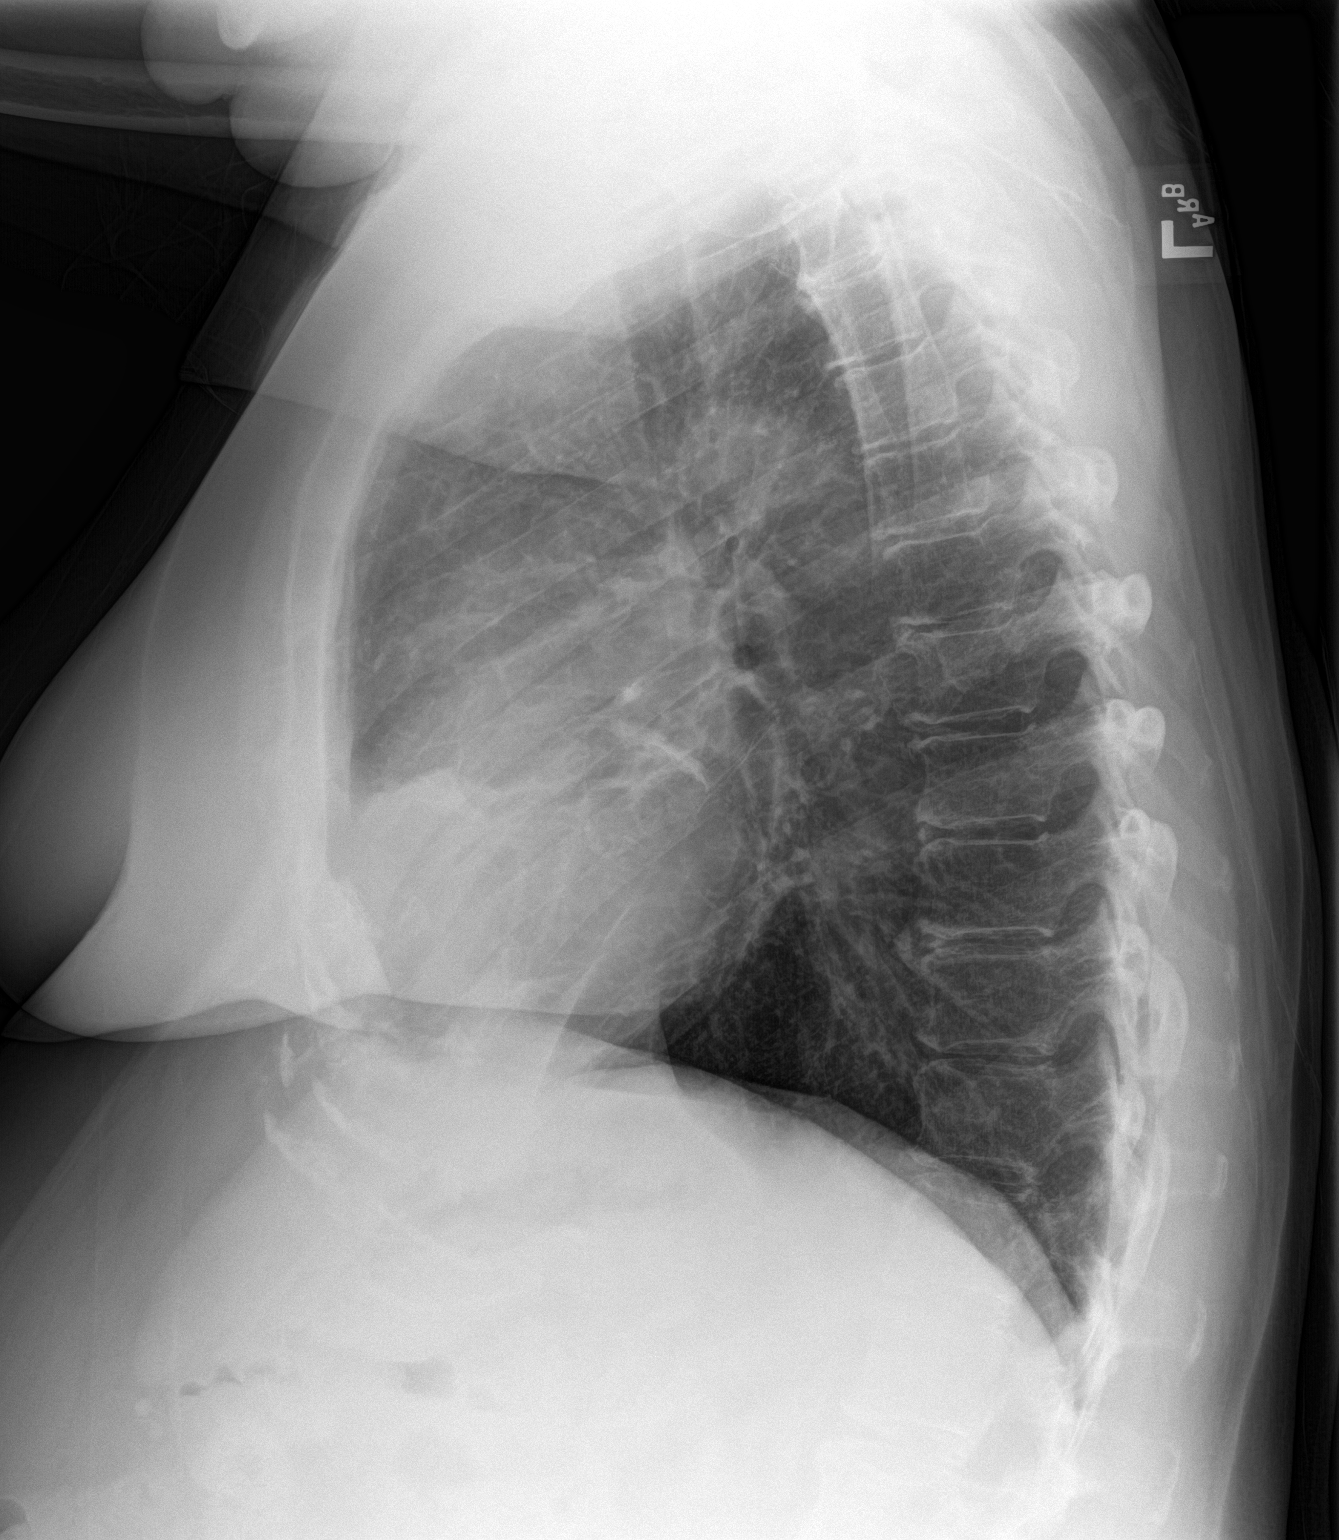

[2 of 2 positions shown; findings below may reference images not displayed]

FINDINGS: There is mild lingular atelectasis versus scarring. There is no
focal parenchymal opacity. There is no pleural effusion or
pneumothorax. The heart and mediastinal contours are unremarkable.

The osseous structures are unremarkable.
IMPRESSION: No active cardiopulmonary disease.

## 2017-09-03 ENCOUNTER — Telehealth: Payer: Self-pay | Admitting: Cardiovascular Disease

## 2017-09-03 ENCOUNTER — Other Ambulatory Visit: Payer: Self-pay | Admitting: Internal Medicine

## 2017-09-03 MED ORDER — CARVEDILOL 3.125 MG PO TABS: ORAL_TABLET | ORAL | 3 refills | Status: DC

## 2017-09-03 MED ORDER — OMEPRAZOLE 20 MG PO CPDR: DELAYED_RELEASE_CAPSULE | ORAL | 3 refills | Status: DC

## 2017-09-03 NOTE — Telephone Encounter (Signed)
°*  STAT* If patient is at the pharmacy, call can be transferred to refill team.   1. Which medications need to be refilled? (please list name of each medication and dose if known)      Carvedilol 3.125 mg po BID and Omeprazole 20 mg po BID  2. Which pharmacy/location (including street and city if local pharmacy) is medication to be sent to? [facility]   3. Do they need a 30 day or 90 day supply? 90

## 2017-09-18 ENCOUNTER — Encounter: Payer: Self-pay | Admitting: Cardiovascular Disease

## 2017-09-18 ENCOUNTER — Ambulatory Visit (INDEPENDENT_AMBULATORY_CARE_PROVIDER_SITE_OTHER): Payer: Medicare Other | Admitting: Cardiovascular Disease

## 2017-09-18 VITALS — BP 136/92 | HR 54 | Ht 64.0 in | Wt 181.5 lb

## 2017-09-18 DIAGNOSIS — R002 Palpitations: Secondary | ICD-10-CM

## 2017-09-18 DIAGNOSIS — I2510 Atherosclerotic heart disease of native coronary artery without angina pectoris: Secondary | ICD-10-CM

## 2017-09-18 DIAGNOSIS — I255 Ischemic cardiomyopathy: Secondary | ICD-10-CM

## 2017-09-18 DIAGNOSIS — Z720 Tobacco use: Secondary | ICD-10-CM | POA: Diagnosis not present

## 2017-09-18 DIAGNOSIS — E785 Hyperlipidemia, unspecified: Secondary | ICD-10-CM | POA: Diagnosis not present

## 2017-09-18 NOTE — Patient Instructions (Addendum)
Follow-Up:  Please call Dr. Birdie Sons and schedule follow up with him. [phone]   Your physician wants you to follow-up in: 6 months with Dr. Kirke Corin. You will receive a reminder letter in the mail two months in advance. If you don't receive a letter, please call our office to schedule the follow-up appointment.  It was a pleasure seeing you today here in the office. Please do not hesitate to give [place] a call back if you have any further questions. [phone]  Medley Cellar RN, BSN

## 2017-09-18 NOTE — Progress Notes (Signed)
Cardiology Office Note   Date:  09/18/2017   ID:  Zahava Brezinski, DOB 1955/06/10, MRN [id number]  PCP:  Glori Luis, MD  Cardiologist:   Lorine Bears, MD   Chief Complaint  Patient presents with  . Other    6 month follow up. Patient c/o denies chest pain and SOB. Meds reviewed verbally with patient.       History of Present Illness: [name] is a 62 y.o. female who presents for a follow-up visit regarding coronary artery disease and ischemic cardiomyopathy. She presented in June of 2017 with a small non-ST elevation myocardial infarction. She underwent cardiac catheterization which showed severe proximal and mid LAD disease. No other obstructive disease. She underwent successful angioplasty and 2 overlapped drug-eluting stent placement to the LAD without complications. Ejection fraction on LV gram was 30-35% but improved an echocardiogram before hospital discharge to 50%. She has other chronic medical conditions that include severe hyperlipidemia, essential hypertension, COPD and tobacco use.  I discontinued Brilinta during last visit.  She has been doing reasonably well with no recent chest pain or worsening shortness of breath.  Unfortunately, she has not been able to quit smoking.  Past Medical History:  Diagnosis Date  . CAD (coronary artery disease)    a. NSTEMI 10/03/15; b. cardiac cath 10/04/15: LM nl, ost-pLAD 90% s/p PCI/DES, mLAD 80% s/p PCI/DES, mRCA 30%, D1,2,3 nl, p-mLCX 30%, OM1 &2 min irregs, OM3 normal, EF 30-35%, sev HK of mid-distal ant, apical, inf wall, mod elevated LVEDP  . HLD (hyperlipidemia)   . Ischemic cardiomyopathy    a. echo 10/05/15: EF 50%, mild HK of anteroseptal and anterior wall, nl LV diastolic fxn  . Tobacco abuse     Past Surgical History:  Procedure Laterality Date  . CARDIAC CATHETERIZATION N/A 10/04/2015   Procedure: Left Heart Cath and Coronary Angiography;  Surgeon: Iran Ouch, MD;  Location: [facility] INVASIVE CV LAB;   Service: Cardiovascular;  Laterality: N/A;  . CARDIAC CATHETERIZATION N/A 10/04/2015   Procedure: Coronary Stent Intervention;  Surgeon: Iran Ouch, MD;  Location: [facility] INVASIVE CV LAB;  Service: Cardiovascular;  Laterality: N/A;     Current Outpatient Medications  Medication Sig Dispense Refill  . albuterol (PROVENTIL HFA;VENTOLIN HFA) 108 (90 Base) MCG/ACT inhaler Inhale 2 puffs into the lungs every 6 (six) hours as needed for wheezing or shortness of breath. 1 Inhaler 2  . aspirin 81 MG chewable tablet Chew 1 tablet (81 mg total) by mouth daily.    . benzonatate (TESSALON) 100 MG capsule TAKE 2 CAPSULES BY MOUTH AT BEDTIME 60 capsule 1  . carvedilol (COREG) 3.125 MG tablet TAKE 1 TABLET BY MOUTH TWICE (2) DAILY 180 tablet 3  . ezetimibe (ZETIA) 10 MG tablet TAKE 1 TABLET BY MOUTH ONCE DAILY 90 tablet 2  . omeprazole (PRILOSEC) 20 MG capsule TAKE 1 CAPSULE BY MOUTH TWICE DAILY BEFORE MEALS 180 capsule 3  . rosuvastatin (CRESTOR) 40 MG tablet TAKE 1 TABLET BY MOUTH ONCE DAILY 90 tablet 3   No current facility-administered medications for this visit.     Allergies:   Penicillins    Social History:  The patient  reports that she has been smoking cigarettes.  She has been smoking about 0.50 packs per day. She has never used smokeless tobacco. She reports that she does not drink alcohol or use drugs.   Family History:  The patient's family history includes CAD in her mother; Colon cancer in her mother.  ROS:  Please see the history of present illness.   Otherwise, review of systems are positive for none.   All other systems are reviewed and negative.    PHYSICAL EXAM: VS:  BP (!) 136/92 (BP Location: Left Arm, Patient Position: Sitting, Cuff Size: Normal)   Pulse (!) 54   Ht 5\' 4"  (1.626 m)   Wt 181 lb 8 oz (82.3 kg)   BMI 31.15 kg/m  , BMI Body mass index is 31.15 kg/m. GEN: Well nourished, well developed, in no acute distress  HEENT: normal  Neck: no JVD, carotid bruits,  or masses Cardiac: RRR; no murmurs, rubs, or gallops,no edema  Respiratory:  clear to auscultation bilaterally, normal work of breathing GI: soft, nontender, nondistended, + BS MS: no deformity or atrophy  Skin: warm and dry, no rash Neuro:  Strength and sensation are intact Psych: euthymic mood, full affect   EKG:  EKG is ordered today. EKG showed sinus bradycardia with no significant ST or T wave changes.   Recent Labs: No results found for requested labs within last 8760 hours.    Lipid Panel    Component Value Date/Time   CHOL 175 06/13/2016 1133   CHOL 168 04/03/2016 1523   TRIG 130 06/13/2016 1133   HDL 72 06/13/2016 1133   HDL 58 04/03/2016 1523   CHOLHDL 2.4 06/13/2016 1133   VLDL 26 06/13/2016 1133   LDLCALC 77 06/13/2016 1133   LDLCALC 84 04/03/2016 1523      Wt Readings from Last 3 Encounters:  09/18/17 181 lb 8 oz (82.3 kg)  01/02/17 170 lb (77.1 kg)  12/19/16 170 lb (77.1 kg)        ASSESSMENT AND PLAN:  1.  Coronary artery disease involving native coronary arteries without angina: She is overall doing very well.  Continue medical therapy.  Continue aspirin indefinitely.  2. Ischemic cardiomyopathy with ejection fraction of 50%. Continue treatment with  small dose carvedilol.  We should consider adding a small dose ACE inhibitor or ARB given mildly elevated blood pressure.  3. Hyperlipidemia:   Continue high-dose rosuvastatin and Zetia.  4. Tobacco use: She cut down but has not been able to quit completely.  I discussed with her the importance of smoking cessation.    Disposition:   FU with me in 6 months.  I asked the patient to schedule a physical with Dr. Birdie Sons as she has not seen him in more than a year.  She will need routine labs including lipid and liver profile.  Signed,  Lorine Bears, MD  09/18/2017 4:35 PM    [facility]

## 2017-10-09 IMAGING — CR DG CHEST 2V
1 series · 3 of 3 positions shown · non-contrast
Comparison: 07/26/2015

CLINICAL DATA: Chest pain radiating to neck and jaw beginning
yesterday. Nausea.

EXAM:
CHEST  2 VIEW

[Series 1: dg chest 2 view · 0.14mm/px · 3 of 3 slices shown]
[im 1/3]
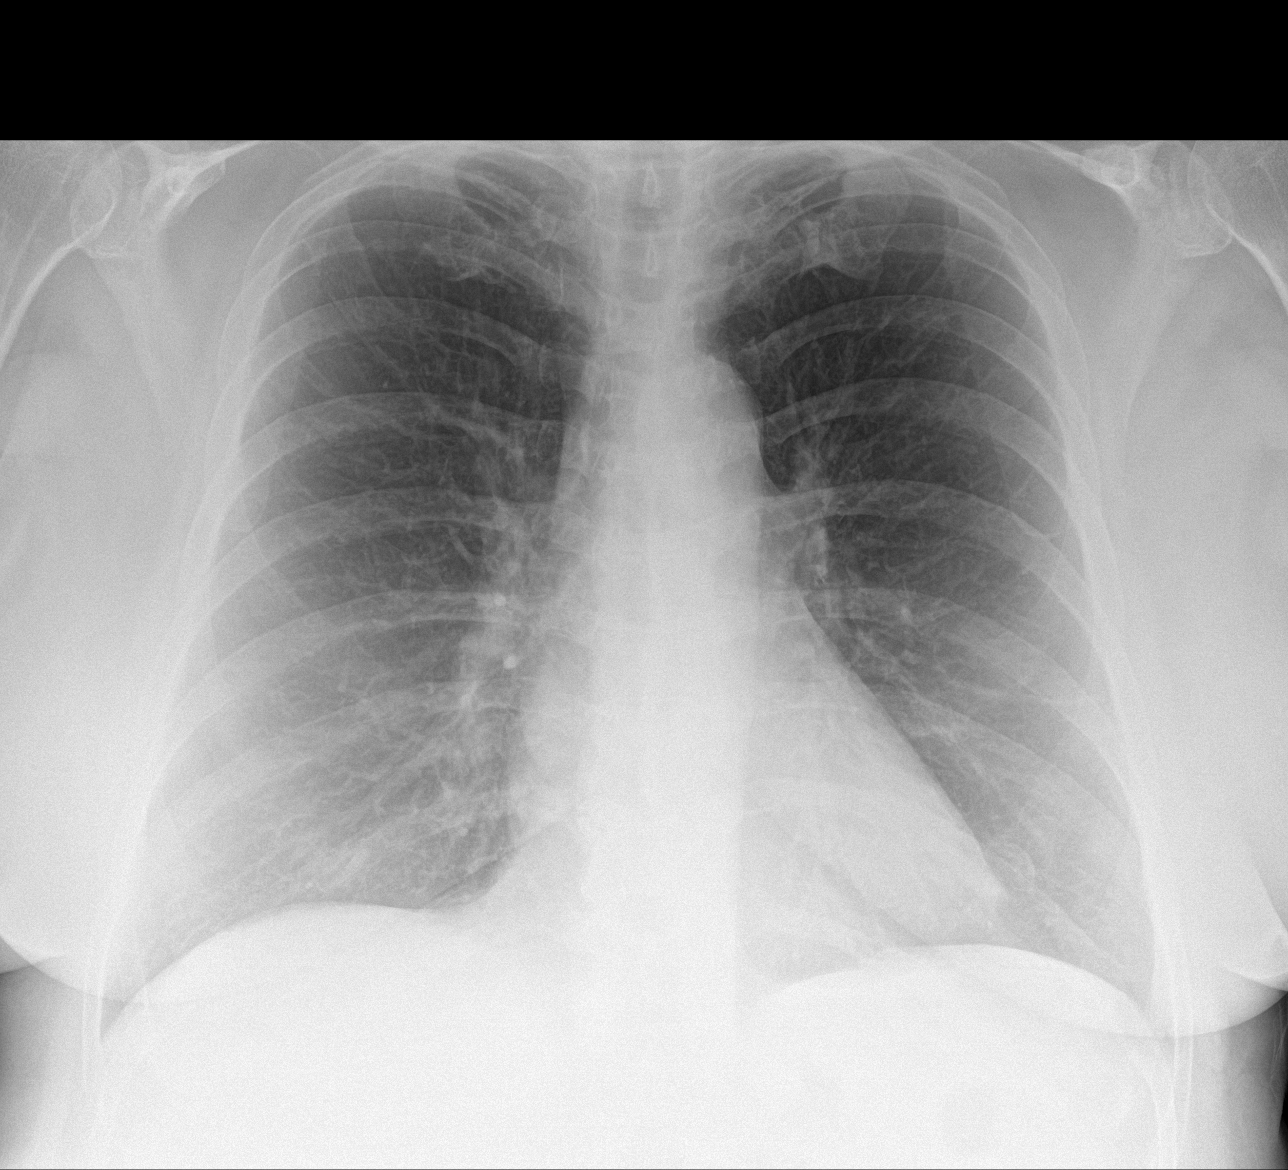
[im 2/3]
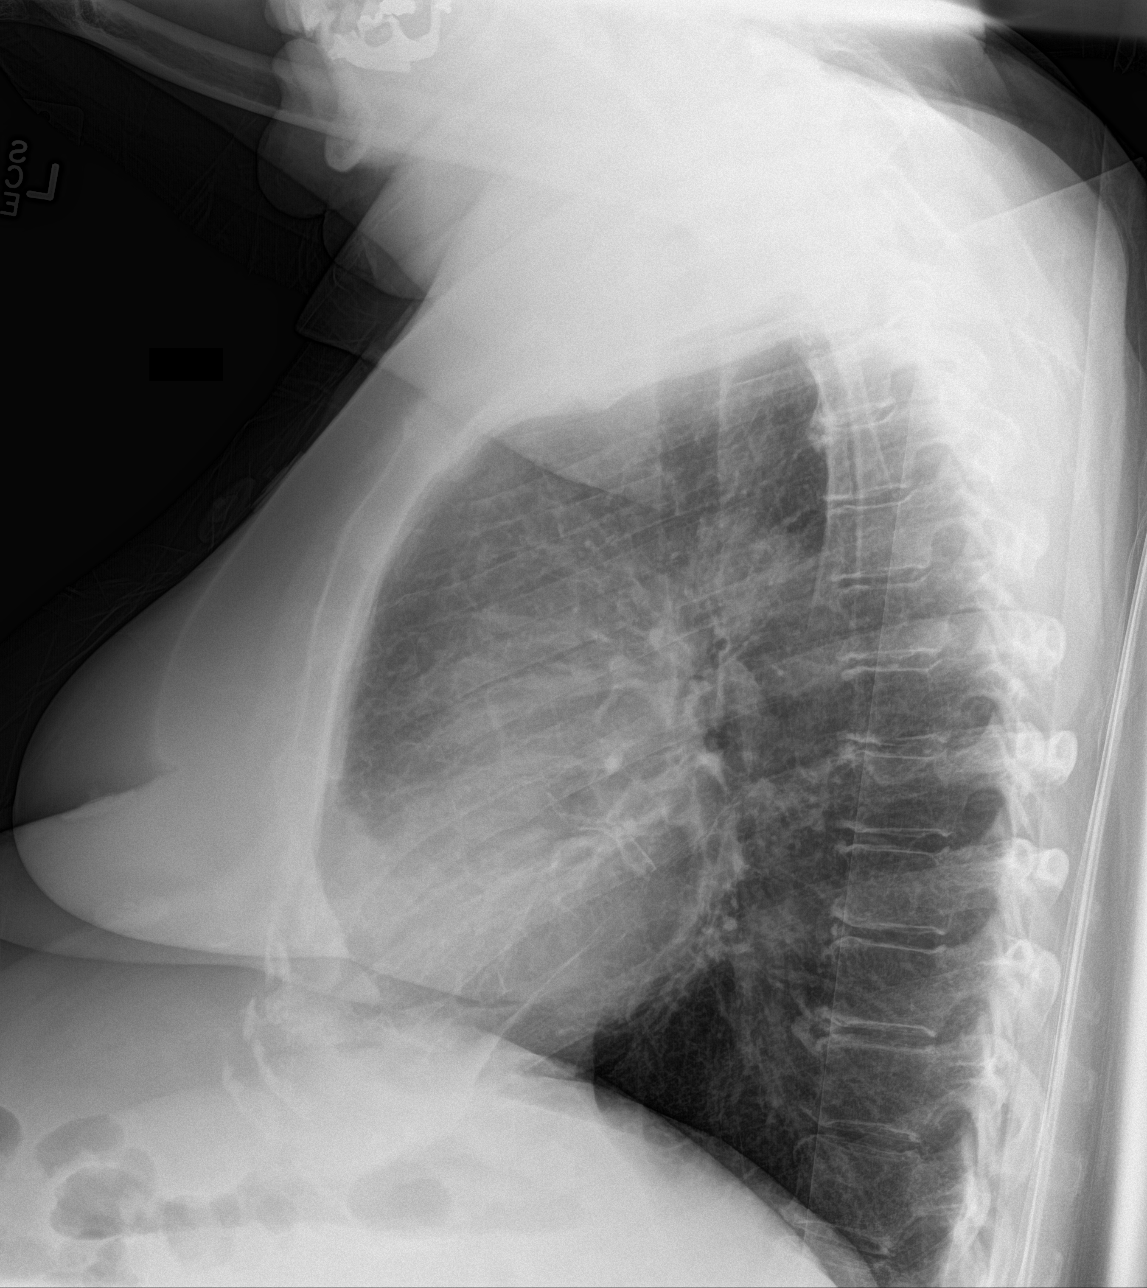
[im 3/3]
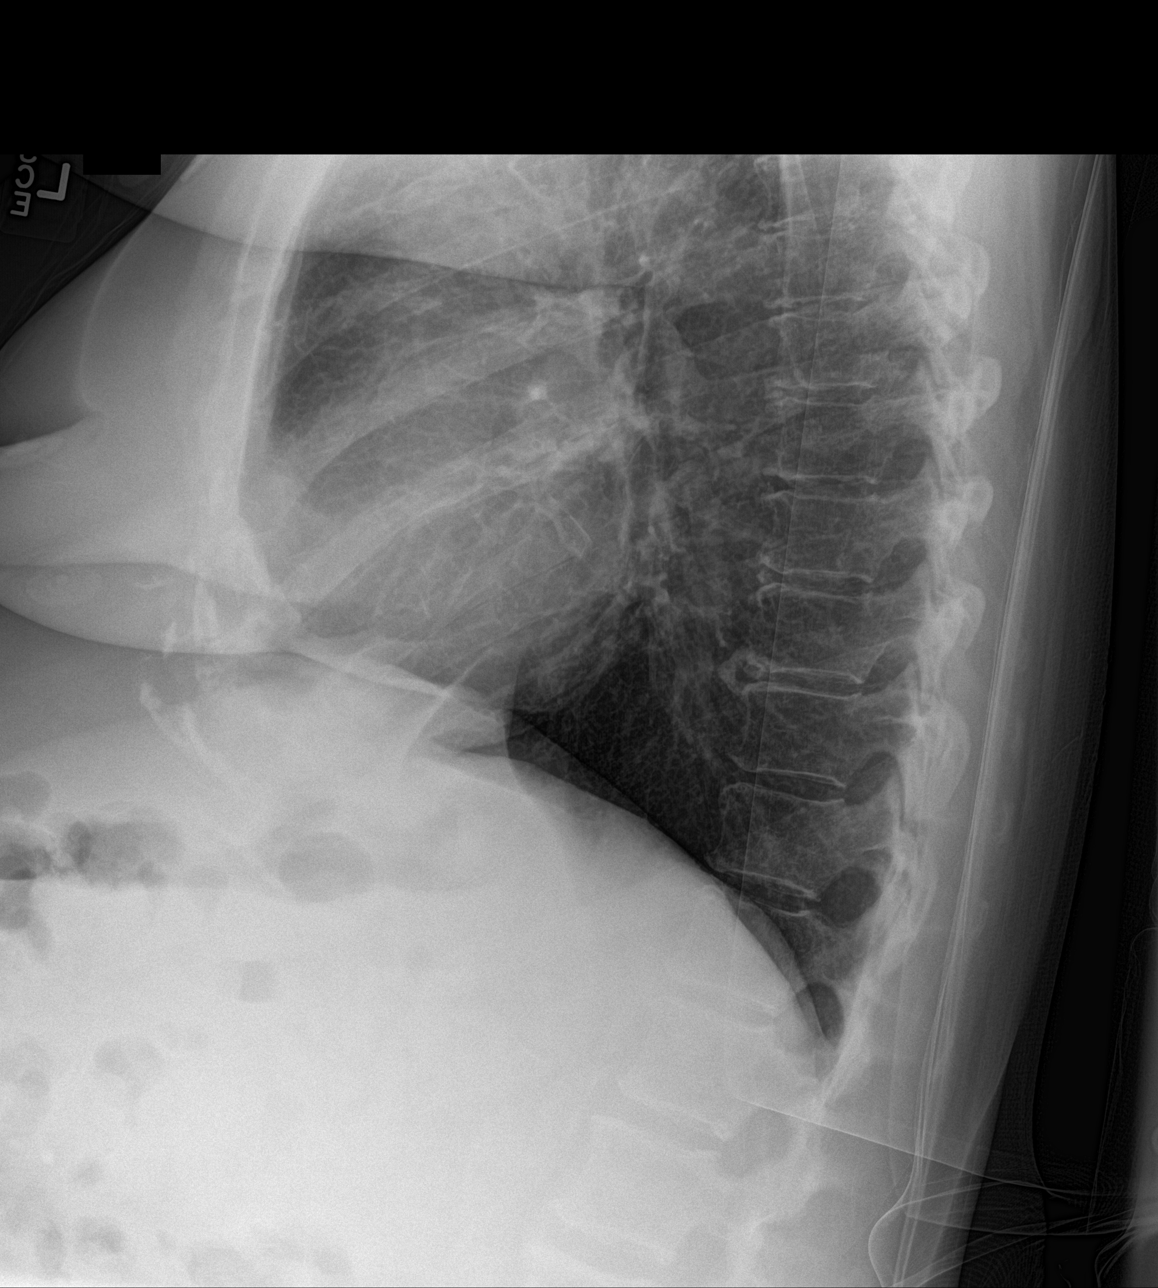

[3 of 3 positions shown; findings below may reference images not displayed]

FINDINGS: The heart size and mediastinal contours are within normal limits.
Both lungs are clear. The visualized skeletal structures are
unremarkable.
IMPRESSION: No active cardiopulmonary disease.

## 2017-11-20 ENCOUNTER — Other Ambulatory Visit: Payer: Self-pay | Admitting: Internal Medicine

## 2018-01-01 ENCOUNTER — Other Ambulatory Visit: Payer: Self-pay | Admitting: Cardiovascular Disease

## 2018-01-11 ENCOUNTER — Ambulatory Visit: Payer: Medicare Other | Admitting: Internal Medicine

## 2018-01-18 ENCOUNTER — Ambulatory Visit: Payer: Medicare Other | Admitting: Internal Medicine

## 2018-02-04 ENCOUNTER — Other Ambulatory Visit: Payer: Self-pay | Admitting: Cardiovascular Disease

## 2018-02-04 ENCOUNTER — Encounter: Payer: Self-pay | Admitting: Internal Medicine

## 2018-02-04 ENCOUNTER — Ambulatory Visit (INDEPENDENT_AMBULATORY_CARE_PROVIDER_SITE_OTHER): Payer: Medicare Other | Admitting: Internal Medicine

## 2018-02-04 VITALS — BP 130/90 | HR 70 | Resp 16 | Ht 64.0 in | Wt 172.8 lb

## 2018-02-04 DIAGNOSIS — J438 Other emphysema: Secondary | ICD-10-CM | POA: Diagnosis not present

## 2018-02-04 DIAGNOSIS — F17210 Nicotine dependence, cigarettes, uncomplicated: Secondary | ICD-10-CM

## 2018-02-04 DIAGNOSIS — Z720 Tobacco use: Secondary | ICD-10-CM

## 2018-02-04 DIAGNOSIS — Z23 Encounter for immunization: Secondary | ICD-10-CM

## 2018-02-04 MED ORDER — ALBUTEROL SULFATE HFA 108 (90 BASE) MCG/ACT IN AERS: 2.0000 | INHALATION_SPRAY | Freq: Four times a day (QID) | RESPIRATORY_TRACT | 2 refills | Status: DC | PRN

## 2018-02-04 MED ORDER — BUDESONIDE-FORMOTEROL FUMARATE 160-4.5 MCG/ACT IN AERO: 1.0000 | INHALATION_SPRAY | Freq: Two times a day (BID) | RESPIRATORY_TRACT | 12 refills | Status: DC

## 2018-02-04 NOTE — Progress Notes (Signed)
[facility] Medicine Consultation      Assessment and Plan:  The patient is a 62 year old female smoker, with coronary artery disease, referred for chronic cough.  Cough due to GERD.  -Multifactorial due to smoking, active GERD symptoms,  COPD. -Doing better with omeprazole bid, will continue.   COPD/emphysema.  -Seen on imaging, appears mild. Moderate symptoms with active wheezing today.  -Will renew ventolin inhaler to use as needed. Will start symbicort 1 puff bid.  Flu vaccine today.   Nicotine abuse. -Discussed importance of cessation, spent 4 months in discussion.   Chronic rhinitis. -This may also be contributing to cough, patient is asked to use Flonase 2 puffs each nostril at night.  Meds ordered this encounter  Medications  . albuterol (PROVENTIL HFA;VENTOLIN HFA) 108 (90 Base) MCG/ACT inhaler    Sig: Inhale 2 puffs into the lungs every 6 (six) hours as needed for wheezing or shortness of breath.    Dispense:  1 Inhaler    Refill:  2  . budesonide-formoterol (SYMBICORT) 160-4.5 MCG/ACT inhaler    Sig: Inhale 1 puff into the lungs 2 (two) times daily. Rinse mouth after use    Dispense:  1 Inhaler    Refill:  12     Date: 02/04/2018  MRN# [id number] [name] 06/30/1955    [name] is a 62 y.o. old female seen in consultation for chief complaint of:    Chief Complaint  Patient presents with  . Emphysema    pt has smokers cough, wheezing she denies chest tightness, sob.    HPI:   The patient is a 62 yo female smoker with a history of ischemic cardiomyopathy. She has been having issues with chronic cough.  She was last seen approximately 1 year ago, at that time it was noted that she had significant GERD symptoms, and her cough improved with changing omeprazole to twice daily.  She was asked to continue omeprazole twice daily, continue Ventolin inhaler, use Flonase 2 sprays in each nostril. Since her last visit she feels that she  continues to cough but it is very mild, and feels that the omeprazole twice daily has really helped. She denies reflux or heartburn. She feels that her breathing is doing ok, but she has occasional wheezing. She ran out ventolin, but with her recent wheezing she could not treat it.   She is smoking, down to 1 cigarette per day or less. A pack lasts her about a month.   **chest x-ray 03/16/16; changes of chronic bronchitis, emphysema, with hyperinflation. No significant change when compared with previous.  Medication:    Current Outpatient Medications:  .  albuterol (PROVENTIL HFA;VENTOLIN HFA) 108 (90 Base) MCG/ACT inhaler, Inhale 2 puffs into the lungs every 6 (six) hours as needed for wheezing or shortness of breath., Disp: 1 Inhaler, Rfl: 2 .  aspirin 81 MG chewable tablet, Chew 1 tablet (81 mg total) by mouth daily., Disp: , Rfl:  .  benzonatate (TESSALON) 100 MG capsule, TAKE 2 CAPSULES BY MOUTH EVERY NIGHT AT BEDTIME, Disp: 60 capsule, Rfl: 1 .  carvedilol (COREG) 3.125 MG tablet, TAKE 1 TABLET BY MOUTH TWICE (2) DAILY, Disp: 180 tablet, Rfl: 3 .  ezetimibe (ZETIA) 10 MG tablet, TAKE 1 TABLET BY MOUTH ONCE DAILY, Disp: 90 tablet, Rfl: 2 .  omeprazole (PRILOSEC) 20 MG capsule, TAKE 1 CAPSULE BY MOUTH TWICE DAILY BEFORE MEALS, Disp: 180 capsule, Rfl: 3 .  rosuvastatin (CRESTOR) 40 MG tablet, TAKE 1 TABLET BY MOUTH ONCE  DAILY, Disp: 90 tablet, Rfl: 1   Allergies:  Penicillins  Review of Systems:  Constitutional: Feels well. Cardiovascular: Denies chest pain, exertional chest pain.  Pulmonary: Denies hemoptysis, pleuritic chest pain.   The remainder of systems were reviewed and were found to be negative other than what is documented in the HPI.   Physical Examination:   VS: BP 130/90 (BP Location: Left Arm, Cuff Size: Large)   Pulse 70   Resp 16   Ht 5\' 4"  (1.626 m)   Wt 172 lb 12.8 oz (78.4 kg)   SpO2 96%   BMI 29.66 kg/m   General Appearance: No distress  Neuro:without focal  findings, mental status, speech normal, alert and oriented HEENT: PERRLA, EOM intact Pulmonary: No wheezing, No rales  CardiovascularNormal S1,S2.  No m/r/g.  Abdomen: Benign, Soft, non-tender, No masses Renal:  No costovertebral tenderness  GU:  No performed at this time. Endoc: No evident thyromegaly, no signs of acromegaly or Cushing features Skin:   warm, no rashes, no ecchymosis  Extremities: normal, no cyanosis, clubbing.       LABORATORY PANEL:   CBC No results for input(s): WBC, HGB, HCT, PLT in the last 168 hours. ------------------------------------------------------------------------------------------------------------------  Chemistries  No results for input(s): NA, K, CL, CO2, GLUCOSE, BUN, CREATININE, CALCIUM, MG, AST, ALT, ALKPHOS, BILITOT in the last 168 hours.  Invalid input(s): GFRCGP ------------------------------------------------------------------------------------------------------------------  Cardiac Enzymes No results for input(s): TROPONINI in the last 168 hours. ------------------------------------------------------------  RADIOLOGY:  No results found.     Thank  you for the consultation and for allowing [facility] to assist in the care of your patient. Our recommendations are noted above.  Please contact us if we can be of further service.  Wells Guiles, M.D., F.C.C.P.  Board Certified in Internal Medicine, Pulmonary Medicine, Critical Care Medicine, and Sleep Medicine.  [facility] Office Number: [phone]   02/04/2018

## 2018-02-04 NOTE — Patient Instructions (Addendum)
Will start symbicort inhaler, use 1 puff twice daily, rinse mouth after use.  Use ventolin inhaler as neeed.  Flu vaccine today.

## 2018-02-04 NOTE — Addendum Note (Signed)
Addended by: Janean Sark on: 02/04/2018 02:59 PM   Modules accepted: Orders

## 2018-03-22 ENCOUNTER — Ambulatory Visit (INDEPENDENT_AMBULATORY_CARE_PROVIDER_SITE_OTHER): Payer: Medicare Other | Admitting: Cardiovascular Disease

## 2018-03-22 ENCOUNTER — Encounter: Payer: Self-pay | Admitting: Cardiovascular Disease

## 2018-03-22 VITALS — BP 164/98 | HR 59 | Ht 64.0 in | Wt 179.5 lb

## 2018-03-22 DIAGNOSIS — I2510 Atherosclerotic heart disease of native coronary artery without angina pectoris: Secondary | ICD-10-CM | POA: Diagnosis not present

## 2018-03-22 DIAGNOSIS — I255 Ischemic cardiomyopathy: Secondary | ICD-10-CM

## 2018-03-22 DIAGNOSIS — I739 Peripheral vascular disease, unspecified: Secondary | ICD-10-CM | POA: Diagnosis not present

## 2018-03-22 DIAGNOSIS — E785 Hyperlipidemia, unspecified: Secondary | ICD-10-CM

## 2018-03-22 DIAGNOSIS — Z720 Tobacco use: Secondary | ICD-10-CM

## 2018-03-22 MED ORDER — LOSARTAN POTASSIUM 25 MG PO TABS: 25.0000 mg | ORAL_TABLET | Freq: Every day | ORAL | 1 refills | Status: DC

## 2018-03-22 NOTE — Patient Instructions (Signed)
Medication Instructions:  START Losartan 25 mg daily  If you need a refill on your cardiac medications before your next appointment, please call your pharmacy.   Lab work: Your provider would like for you to have the following labs FASTING Bmet, lipid and liver on the same day as your doppler.   Testing/Procedures: Your physician has requested that you have a lower extremity segmental doppler. This will take place at [address], the [facility] office.   Follow-Up: At [facility], you and your health needs are our priority.  As part of our continuing mission to provide you with exceptional heart care, we have created designated Provider Care Teams.  These Care Teams include your primary Cardiologist (physician) and Advanced Practice Providers (APPs -  Physician Assistants and Nurse Practitioners) who all work together to provide you with the care you need, when you need it. You will need a follow up appointment in 6 months.  Please call our office 2 months in advance to schedule this appointment.  You may see Dr. Kirke Corin or one of the following Advanced Practice Providers on your designated Care Team:   Nicolasa Ducking, NP Eula Listen, PA-C . Marisue Ivan, PA-C

## 2018-03-22 NOTE — Progress Notes (Signed)
Cardiology Office Note   Date:  03/22/2018   ID:  [name], DOB Oct 28, 1955, MRN [id number]  PCP:  Glori Luis, MD  Cardiologist:   Lorine Bears, MD   Chief Complaint  Patient presents with  . other    6 month follow up. Meds reviewed by the pt. verbally. "doing well."  Pt. c/o right leg pain.       History of Present Illness: [name] is a 62 y.o. female who presents for a follow-up visit regarding coronary artery disease and ischemic cardiomyopathy. She presented in June of 2017 with a small non-ST elevation myocardial infarction. She underwent cardiac catheterization which showed severe proximal and mid LAD disease. No other obstructive disease. She underwent successful angioplasty and 2 overlapped drug-eluting stent placement to the LAD without complications. Ejection fraction on LV gram was 30-35% but improved an echocardiogram before hospital discharge to 50%. She has other chronic medical conditions that include severe hyperlipidemia, essential hypertension, COPD and tobacco use.  She has been doing well with no recent chest pain or shortness of breath.  She complains of right leg pain mostly at night if she sleeps on the right side.  She has no exertional leg pain.  She takes her medications regularly.  Past Medical History:  Diagnosis Date  . CAD (coronary artery disease)    a. NSTEMI 10/03/15; b. cardiac cath 10/04/15: LM nl, ost-pLAD 90% s/p PCI/DES, mLAD 80% s/p PCI/DES, mRCA 30%, D1,2,3 nl, p-mLCX 30%, OM1 &2 min irregs, OM3 normal, EF 30-35%, sev HK of mid-distal ant, apical, inf wall, mod elevated LVEDP  . HLD (hyperlipidemia)   . Ischemic cardiomyopathy    a. echo 10/05/15: EF 50%, mild HK of anteroseptal and anterior wall, nl LV diastolic fxn  . Tobacco abuse     Past Surgical History:  Procedure Laterality Date  . CARDIAC CATHETERIZATION N/A 10/04/2015   Procedure: Left Heart Cath and Coronary Angiography;  Surgeon: Iran Ouch, MD;   Location: [facility] INVASIVE CV LAB;  Service: Cardiovascular;  Laterality: N/A;  . CARDIAC CATHETERIZATION N/A 10/04/2015   Procedure: Coronary Stent Intervention;  Surgeon: Iran Ouch, MD;  Location: [facility] INVASIVE CV LAB;  Service: Cardiovascular;  Laterality: N/A;     Current Outpatient Medications  Medication Sig Dispense Refill  . albuterol (PROVENTIL HFA;VENTOLIN HFA) 108 (90 Base) MCG/ACT inhaler Inhale 2 puffs into the lungs every 6 (six) hours as needed for wheezing or shortness of breath. 1 Inhaler 2  . aspirin 81 MG chewable tablet Chew 1 tablet (81 mg total) by mouth daily.    . benzonatate (TESSALON) 100 MG capsule TAKE 2 CAPSULES BY MOUTH EVERY NIGHT AT BEDTIME 60 capsule 1  . budesonide-formoterol (SYMBICORT) 160-4.5 MCG/ACT inhaler Inhale 1 puff into the lungs 2 (two) times daily. Rinse mouth after use 1 Inhaler 12  . carvedilol (COREG) 3.125 MG tablet TAKE 1 TABLET BY MOUTH TWICE (2) DAILY 180 tablet 3  . ezetimibe (ZETIA) 10 MG tablet TAKE 1 TABLET BY MOUTH ONCE DAILY 90 tablet 0  . omeprazole (PRILOSEC) 20 MG capsule TAKE 1 CAPSULE BY MOUTH TWICE DAILY BEFORE MEALS 180 capsule 3  . rosuvastatin (CRESTOR) 40 MG tablet TAKE 1 TABLET BY MOUTH ONCE DAILY 90 tablet 1   No current facility-administered medications for this visit.     Allergies:   Penicillins    Social History:  The patient  reports that she has been smoking cigarettes. She has a 21.00 pack-year smoking history. She has  never used smokeless tobacco. She reports that she does not drink alcohol or use drugs.   Family History:  The patient's family history includes CAD in her mother; Colon cancer in her mother.    ROS:  Please see the history of present illness.   Otherwise, review of systems are positive for none.   All other systems are reviewed and negative.    PHYSICAL EXAM: VS:  BP (!) 164/98 (BP Location: Left Arm, Patient Position: Sitting, Cuff Size: Normal)   Pulse (!) 59   Ht 5\' 4"  (1.626 m)   Wt  179 lb 8 oz (81.4 kg)   BMI 30.81 kg/m  , BMI Body mass index is 30.81 kg/m. GEN: Well nourished, well developed, in no acute distress  HEENT: normal  Neck: no JVD, carotid bruits, or masses Cardiac: RRR; no murmurs, rubs, or gallops,no edema  Respiratory:  clear to auscultation bilaterally, normal work of breathing GI: soft, nontender, nondistended, + BS MS: no deformity or atrophy  Skin: warm and dry, no rash Neuro:  Strength and sensation are intact Psych: euthymic mood, full affect Vascular: Femoral pulses mildly diminished on the right normal on the left.  Distal pulses are also weaker on the right than the left.   EKG:  EKG is ordered today. EKG showed sinus bradycardia with no significant ST or T wave changes.   Recent Labs: No results found for requested labs within last 8760 hours.    Lipid Panel    Component Value Date/Time   CHOL 175 06/13/2016 1133   CHOL 168 04/03/2016 1523   TRIG 130 06/13/2016 1133   HDL 72 06/13/2016 1133   HDL 58 04/03/2016 1523   CHOLHDL 2.4 06/13/2016 1133   VLDL 26 06/13/2016 1133   LDLCALC 77 06/13/2016 1133   LDLCALC 84 04/03/2016 1523      Wt Readings from Last 3 Encounters:  03/22/18 179 lb 8 oz (81.4 kg)  02/04/18 172 lb 12.8 oz (78.4 kg)  09/18/17 181 lb 8 oz (82.3 kg)        ASSESSMENT AND PLAN:  1.  Coronary artery disease involving native coronary arteries without angina: She is overall doing very well.  Continue medical therapy.  Continue aspirin indefinitely.  2. Ischemic cardiomyopathy with ejection fraction of 50%. Continue treatment with  small dose carvedilol.  Given that her blood pressure has been running high, I elected to add losartan 25 mg once daily.  Check basic metabolic profile in 1 week.  3. Hyperlipidemia:   Continue high-dose rosuvastatin and Zetia.  Check lipid and liver profile with next labs.  4. Tobacco use: She cut down but has not been able to quit completely.  I discussed with her the  importance of smoking cessation.  5.  Atypical right leg claudication: Her symptoms are not exertional but her exam is slightly abnormal.  Thus, I requested lower extremity arterial Doppler.   Disposition:   FU with me in 6 months.     Signed,  Lorine Bears, MD  03/22/2018 11:34 AM    [facility]

## 2018-03-23 IMAGING — DX DG CHEST 2V
2 series · 2 of 2 positions shown · non-contrast
Comparison: Radiographs 10/03/2015

CLINICAL DATA: Chronic cough.

EXAM:
CHEST  2 VIEW

[chest pa]
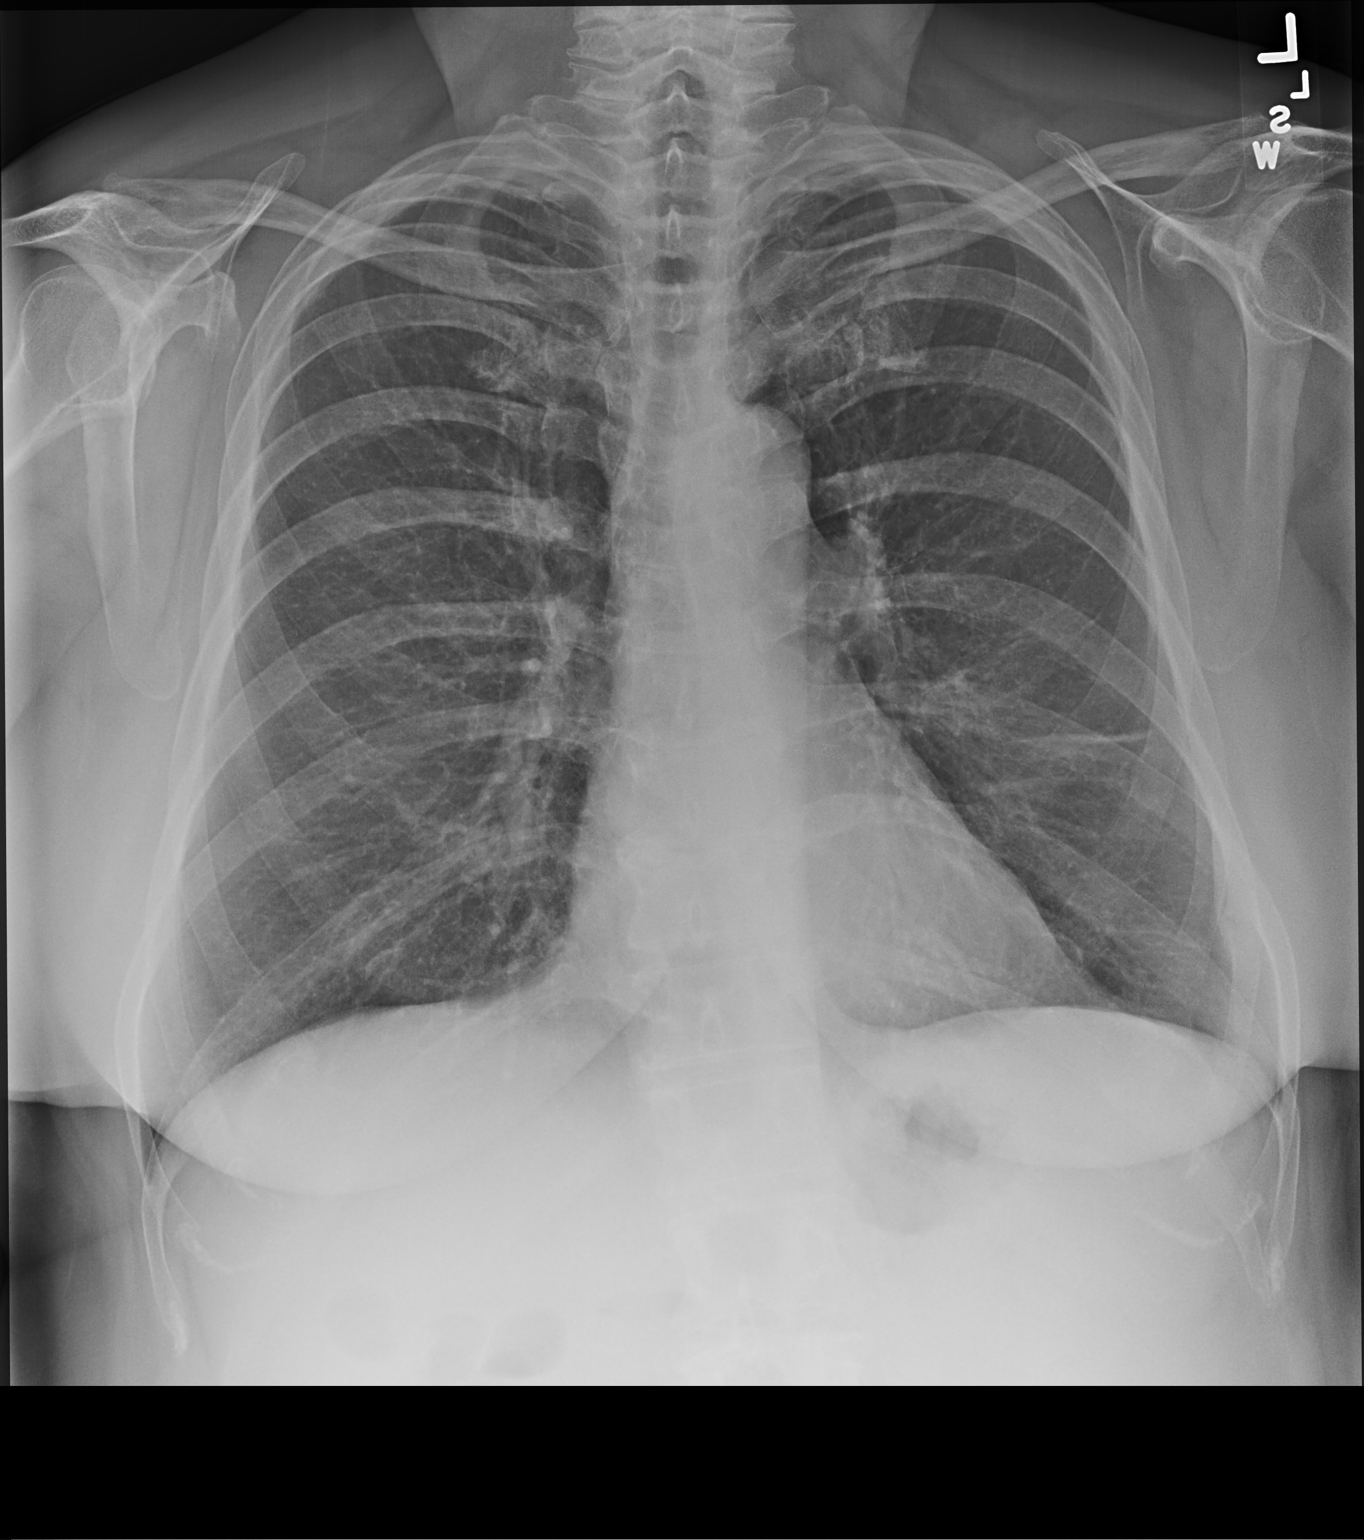

[chest lat]
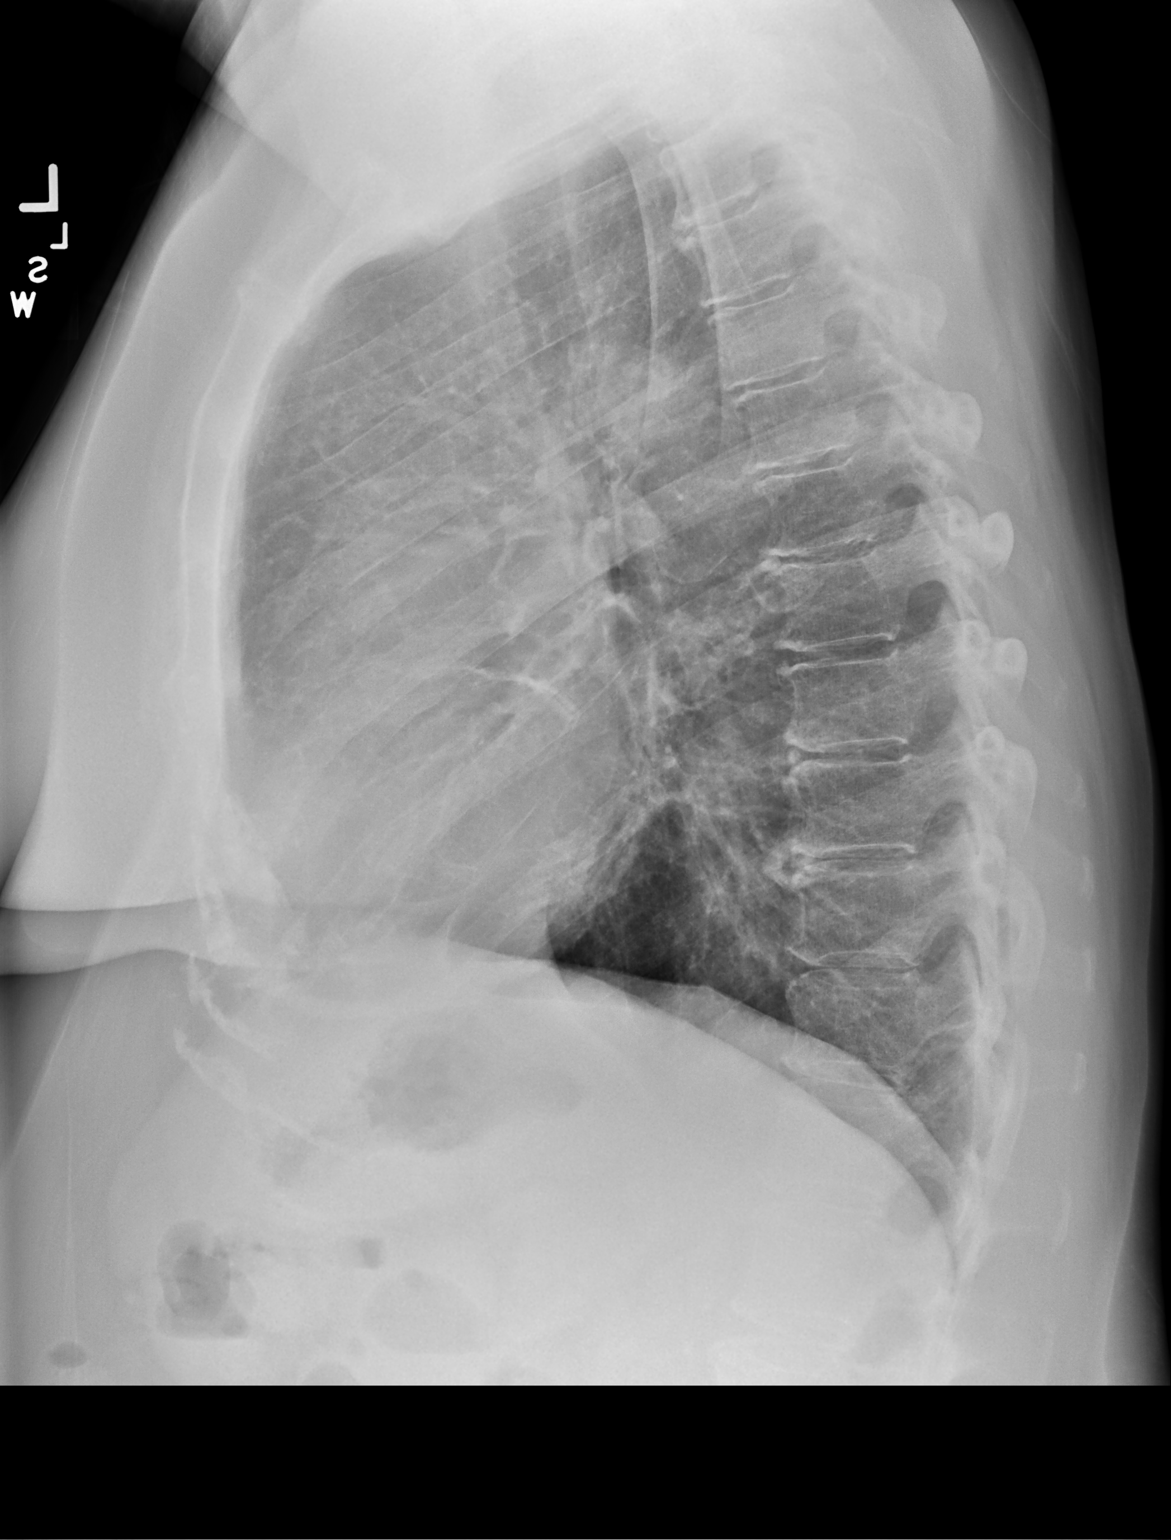

[2 of 2 positions shown; findings below may reference images not displayed]

FINDINGS: The cardiomediastinal contours are normal. Linear atelectasis or
scarring in the left mid lower lung zone, lungs are otherwise clear.
Pulmonary vasculature is normal. No consolidation, pleural effusion,
or pneumothorax. No acute osseous abnormalities are seen. Possible
remote fracture of left lateral eighth rib.
IMPRESSION: Left midlung zone atelectasis or scarring.  No acute abnormality.

## 2018-04-02 ENCOUNTER — Other Ambulatory Visit: Payer: Self-pay | Admitting: Cardiovascular Disease

## 2018-04-02 DIAGNOSIS — I739 Peripheral vascular disease, unspecified: Secondary | ICD-10-CM

## 2018-04-15 ENCOUNTER — Ambulatory Visit (INDEPENDENT_AMBULATORY_CARE_PROVIDER_SITE_OTHER): Payer: Medicare Other

## 2018-04-15 ENCOUNTER — Other Ambulatory Visit (INDEPENDENT_AMBULATORY_CARE_PROVIDER_SITE_OTHER): Payer: Medicare Other

## 2018-04-15 DIAGNOSIS — I2510 Atherosclerotic heart disease of native coronary artery without angina pectoris: Secondary | ICD-10-CM

## 2018-04-15 DIAGNOSIS — I739 Peripheral vascular disease, unspecified: Secondary | ICD-10-CM

## 2018-04-15 DIAGNOSIS — E785 Hyperlipidemia, unspecified: Secondary | ICD-10-CM

## 2018-04-16 LAB — LIPID PANEL
CHOLESTEROL TOTAL: 228 mg/dL — AB (ref 100–199)
Chol/HDL Ratio: 2.4 ratio (ref 0.0–4.4)
HDL: 95 mg/dL (ref >39)
LDL CALC: 104 mg/dL — AB (ref 0–99)
TRIGLYCERIDES: 147 mg/dL (ref 0–149)
VLDL Cholesterol Cal: 29 mg/dL (ref 5–40)

## 2018-04-16 LAB — HEPATIC FUNCTION PANEL
ALK PHOS: 91 IU/L (ref 39–117)
ALT: 22 IU/L (ref 0–32)
AST: 21 IU/L (ref 0–40)
Albumin: 4.7 g/dL (ref 3.6–4.8)
Bilirubin Total: <0.2 mg/dL (ref 0.0–1.2)
Bilirubin, Direct: 0.07 mg/dL (ref 0.00–0.40)
Total Protein: 7.1 g/dL (ref 6.0–8.5)

## 2018-04-16 LAB — BASIC METABOLIC PANEL
BUN / CREAT RATIO: 20 (ref 12–28)
BUN: 18 mg/dL (ref 8–27)
CHLORIDE: 100 mmol/L (ref 96–106)
CO2: 23 mmol/L (ref 20–29)
Calcium: 9.8 mg/dL (ref 8.7–10.3)
Creatinine, Ser: 0.9 mg/dL (ref 0.57–1.00)
GFR calc Af Amer: 78 mL/min/{1.73_m2} (ref >59)
GFR calc non Af Amer: 68 mL/min/{1.73_m2} (ref >59)
GLUCOSE: 68 mg/dL (ref 65–99)
Potassium: 4.8 mmol/L (ref 3.5–5.2)
SODIUM: 141 mmol/L (ref 134–144)

## 2018-04-17 ENCOUNTER — Telehealth: Payer: Self-pay | Admitting: Cardiovascular Disease

## 2018-04-17 NOTE — Telephone Encounter (Signed)
I recommend switching Zetia to Praluent 75 mg every 2 weeks.  We can show her how to inject and also we have to make sure it is covered by her insurance.  There is no urgency in doing this and this can be done in January after the holidays.

## 2018-04-17 NOTE — Telephone Encounter (Signed)
I spoke with the patient regarding her lab results. She does confirm she is taking both Crestor 40 mg once daily and Zetia 10 mg once daily. I have advised her of Dr. Jari Sportsman recommendations that he may potentially want to switch her to praluent/ repatha. She was not in favor of giving herself injections. I advised the injection looks more like a pen and you can't see the needle. Also advised if Dr.Arida does want her on an injectable medication, we can certainly bring her in for education prior to using this.   She is aware I will forward this message back to Dr. Kirke Corin and we will call her back with any further recommendations. She states she will be leaving this weekend for [place] to see her daughter for Christmas and she will be gone all week next week.

## 2018-04-17 NOTE — Telephone Encounter (Signed)
Notes recorded by Iran Ouch, MD on 04/17/2018 at 4:14 PM EST Inform patient that labs were normal. Cholesterol was mildly elevated, worse than before and above target. Has she been taking rosuvastatin and Zetia regularly? If she has, we will have to think about switching her to Repatha or Praluent from Zetia.

## 2018-04-18 ENCOUNTER — Other Ambulatory Visit: Payer: Self-pay | Admitting: Cardiovascular Disease

## 2018-04-18 NOTE — Telephone Encounter (Signed)
°*  STAT* If patient is at the pharmacy, call can be transferred to refill team.   1. Which medications need to be refilled? (please list name of each medication and dose if known)  Ezetimibe (ZETIA)   2. Which pharmacy/location (including street and city if local pharmacy) is medication to be sent to? [facility]   3. Do they need a 30 day or 90 day supply? 90 day   Patient calling to check on status.  States she will be going out of town after today so she will need this medication ASAP

## 2018-04-18 NOTE — Telephone Encounter (Signed)
Patient called and made aware that the process for starting Praluent will start in January and the office will call her back after the holidays to get this started.

## 2018-04-18 NOTE — Telephone Encounter (Signed)
Please review for refill, medication changes discussed 04/17/18.

## 2018-05-03 MED ORDER — ALIROCUMAB 75 MG/ML ~~LOC~~ SOAJ: 75.0000 mg | SUBCUTANEOUS | 11 refills | Status: DC

## 2018-05-03 NOTE — Telephone Encounter (Signed)
Praluent 75 mg every 14 days has been sent into the pharmacy.

## 2018-05-06 ENCOUNTER — Telehealth: Payer: Self-pay | Admitting: *Deleted

## 2018-05-06 NOTE — Telephone Encounter (Signed)
Pt has been approved for Praluent 75 mg inj. Pt approved 05/06/2018-11/04/2018.

## 2018-05-06 NOTE — Telephone Encounter (Signed)
Pt requiring PA for Praluent 75 mg ij. PA has been started through CoverMyMeds.

## 2018-05-06 NOTE — Telephone Encounter (Signed)
[employer] is reviewing your PA request. Typically an electronic response will be received within 72 hours. To check for an update later, open this request from your dashboard. 

## 2018-05-07 DIAGNOSIS — H2513 Age-related nuclear cataract, bilateral: Secondary | ICD-10-CM | POA: Diagnosis not present

## 2018-05-13 ENCOUNTER — Telehealth: Payer: Self-pay | Admitting: Cardiovascular Disease

## 2018-05-13 NOTE — Telephone Encounter (Signed)
Please call regarding Praluent shot, pt is not sure she can do this.

## 2018-05-14 NOTE — Telephone Encounter (Signed)
Returned the call to the patient. She is new to Praluent and would like instructions on how to do the injections. She has an appointment tomorrow at 11:30 as a nurse visit for instructions. She has verbalized her understanding.

## 2018-05-15 ENCOUNTER — Ambulatory Visit (INDEPENDENT_AMBULATORY_CARE_PROVIDER_SITE_OTHER): Payer: Medicare Other | Admitting: *Deleted

## 2018-05-15 ENCOUNTER — Telehealth: Payer: Self-pay | Admitting: *Deleted

## 2018-05-15 DIAGNOSIS — Z719 Counseling, unspecified: Secondary | ICD-10-CM | POA: Diagnosis not present

## 2018-05-15 DIAGNOSIS — E785 Hyperlipidemia, unspecified: Secondary | ICD-10-CM

## 2018-05-15 NOTE — Telephone Encounter (Signed)
-----   Message from Sandi Mariscal, RN sent at 05/14/2018  2:44 PM EST ----- Regarding: Praluent labs

## 2018-05-15 NOTE — Patient Instructions (Addendum)
1) STOP zetia  2) You gave yourself your 1st Praulent injection today  - you will take 1 injection (75 mg/ mL) twice a month  1st dose: Wednesday 05/15/18 - Left thigh  2nd dose due: 05/29/18- Right thigh  If you have any questions please call the office at [phone].

## 2018-05-15 NOTE — Progress Notes (Signed)
1.) Reason for visit: Praluent education  2.) Name of MD requesting visit: Arida  3.) H&P: The patient has a history of CAD and hyperlipidemia. She has been taking crestor 40 mg once daily and Zetia 10 mg once daily for lipid management. Her most recent lipid profile was done on 04/15/18 and showed her LDL to be 104. It was recommended by Dr. Kirke Corin at that time that she stop zetia and start praluent or repatha. The patient was agreeable and given a prescription for praluent 75 mg every 14 days.   4.) ROS related to problem: The patient presents to the office today for education regarding administration of praluent. She states she is quite anxious about giving herself the injections.   5.) Assessment and plan per MD: Education provided to the patient regarding administration of praluent. The patient brought her prescription with her today for 1st time injection in the office. We reviewed, in depth, education provided by the pharmacy with the patient's prescription. The patient used the training pen in various locations of areas that are acceptable to administer the medication. The patient did prep her skin appropriately and give herself her 1st injection of praluent 75 mg to the left thigh. I advised the patient to mark on her calender that her 1st injection was today in the left thigh and her 2nd injection will be 05/29/18 (right thigh if she prefers- as long as she alternates injection sites). The patient voiced understanding of the process of administering her medication and felt she would be capable of doing this at home. She is aware that she will need a repeat lipid panel in 2 months and Dr. Jari Sportsman nurse will touch base with her closer to that time to arrange. Misty Stanley, RN is aware.

## 2018-08-01 ENCOUNTER — Telehealth: Payer: Self-pay | Admitting: Internal Medicine

## 2018-08-01 NOTE — Telephone Encounter (Signed)
Lm requesting that 08/12/18 OV be moved to week of 08/05/18.

## 2018-08-01 NOTE — Telephone Encounter (Signed)
Pt returned call and is ok with move. Aware that is it phone visit. Nothing further needed.

## 2018-08-07 ENCOUNTER — Ambulatory Visit (INDEPENDENT_AMBULATORY_CARE_PROVIDER_SITE_OTHER): Payer: Medicare Other | Admitting: Internal Medicine

## 2018-08-07 DIAGNOSIS — Z720 Tobacco use: Secondary | ICD-10-CM

## 2018-08-07 DIAGNOSIS — J438 Other emphysema: Secondary | ICD-10-CM

## 2018-08-07 NOTE — Progress Notes (Signed)
[facility] Consultation     Virtual Visit via Telephone Note I connected with pt on 08/07/18 at 10:30 AM EDT by telephone and verified that I am speaking with the correct person using two identifiers.   I discussed the limitations, risks, security and privacy concerns of performing an evaluation and management service by telephone and the availability of in person appointments. I also discussed with the patient that there may be a patient responsible charge related to this service. The patient expressed understanding and agreed to proceed. I discussed the assessment and treatment plan with the patient. The patient was provided an opportunity to ask questions and all were answered. The patient agreed with the plan and demonstrated an understanding of the instructions. Please see note below for further detail.    The patient was advised to call back or seek an in-person evaluation if the symptoms worsen or if the condition fails to improve as anticipated.  I provided 11 minutes of non-face-to-face time during this encounter.   Shane Crutch, MD    Assessment and Plan:  The patient is a 63 year old female smoker, with coronary artery disease, with chronic cough.  Cough due to GERD.  -Multifactorial due to smoking, active GERD symptoms,  COPD. -Doing better with omeprazole bid, will continue.   COPD/emphysema.  -Seen on imaging, appears mild. Moderate symptoms with active wheezing today.  -Doing well, continue symbicort twice daily. Using rescue inhaler only rarely.   Nicotine abuse. -Discussed importance of cessation, spent 3 minutes in discussion, not really thinking about quitting at this time.   Chronic rhinitis. -This may also be contributing to cough, patient is asked to use Flonase 2 puffs each nostril at night.  Return in about 6 months (around 02/06/2019).    Date: 08/07/2018  MRN# [id number] Veranda Vedder 12-Aug-1955    [name] is a 63 y.o. old female seen in consultation for chief complaint of:   dyspnea.    HPI:   The patient is a 63 yo female smoker with a history of ischemic cardiomyopathy.  At last visit she was asked to continue omeprazole twice daily, Ventolin, Flonase, Symbicort.  Counseled on smoking cessation. Since her last visit her cough has done very well. She is using symbicort 2 puffs twice daily. She uses her rescue as needed, which is less than twice per week. She is still taking omeprazole twice daily, she stopped nasal spray.  She smoking about 3 cigs per day, she is not really thinking about quitting at this time.     **chest x-ray 03/16/16; changes of chronic bronchitis, emphysema, with hyperinflation. No significant change when compared with previous.  Medication:    Current Outpatient Medications:  .  albuterol (PROVENTIL HFA;VENTOLIN HFA) 108 (90 Base) MCG/ACT inhaler, Inhale 2 puffs into the lungs every 6 (six) hours as needed for wheezing or shortness of breath., Disp: 1 Inhaler, Rfl: 2 .  Alirocumab (PRALUENT) 75 MG/ML SOAJ, Inject 75 mg into the skin every 14 (fourteen) days., Disp: 2 pen, Rfl: 11 .  aspirin 81 MG chewable tablet, Chew 1 tablet (81 mg total) by mouth daily., Disp: , Rfl:  .  benzonatate (TESSALON) 100 MG capsule, TAKE 2 CAPSULES BY MOUTH EVERY NIGHT AT BEDTIME, Disp: 60 capsule, Rfl: 1 .  budesonide-formoterol (SYMBICORT) 160-4.5 MCG/ACT inhaler, Inhale 1 puff into the lungs 2 (two) times daily. Rinse mouth after use, Disp: 1 Inhaler, Rfl: 12 .  carvedilol (COREG) 3.125 MG tablet, TAKE 1 TABLET BY MOUTH TWICE (  2) DAILY, Disp: 180 tablet, Rfl: 3 .  losartan (COZAAR) 25 MG tablet, Take 1 tablet (25 mg total) by mouth daily., Disp: 90 tablet, Rfl: 1 .  omeprazole (PRILOSEC) 20 MG capsule, TAKE 1 CAPSULE BY MOUTH TWICE DAILY BEFORE MEALS, Disp: 180 capsule, Rfl: 3 .  rosuvastatin (CRESTOR) 40 MG tablet, TAKE 1 TABLET BY MOUTH ONCE DAILY, Disp: 90 tablet, Rfl: 1    Allergies:  Penicillins  Review of Systems:  Constitutional: Feels well. Cardiovascular: Denies chest pain, exertional chest pain.  Pulmonary: Denies hemoptysis, pleuritic chest pain.   The remainder of systems were reviewed and were found to be negative other than what is documented in the HPI.   Physical Examination:  --  LABORATORY PANEL:   CBC No results for input(s): WBC, HGB, HCT, PLT in the last 168 hours. ------------------------------------------------------------------------------------------------------------------  Chemistries  No results for input(s): NA, K, CL, CO2, GLUCOSE, BUN, CREATININE, CALCIUM, MG, AST, ALT, ALKPHOS, BILITOT in the last 168 hours.  Invalid input(s): GFRCGP ------------------------------------------------------------------------------------------------------------------  Cardiac Enzymes No results for input(s): TROPONINI in the last 168 hours. ------------------------------------------------------------  RADIOLOGY:  No results found.     Thank  you for the consultation and for allowing [facility] to assist in the care of your patient. Our recommendations are noted above.  Please contact us if we can be of further service.  Wells Guiles, M.D., F.C.C.P.  Board Certified in Internal Medicine, Pulmonary Medicine, Critical Care Medicine, and Sleep Medicine.  [facility] Office Number: [phone]   08/07/2018

## 2018-08-12 ENCOUNTER — Ambulatory Visit: Payer: Medicare Other | Admitting: Internal Medicine

## 2018-08-21 ENCOUNTER — Other Ambulatory Visit: Payer: Self-pay | Admitting: Cardiovascular Disease

## 2018-09-02 ENCOUNTER — Other Ambulatory Visit: Payer: Self-pay | Admitting: Cardiovascular Disease

## 2018-10-03 ENCOUNTER — Other Ambulatory Visit: Payer: Self-pay | Admitting: Cardiovascular Disease

## 2018-10-08 ENCOUNTER — Telehealth: Payer: Self-pay | Admitting: *Deleted

## 2018-10-08 NOTE — Telephone Encounter (Signed)
Pt requiring PA for Praluent 75 mg. PA has been submitted through Covermymeds. Pt has been approved until 05/01/2019.

## 2018-10-30 ENCOUNTER — Other Ambulatory Visit: Payer: Self-pay | Admitting: Cardiovascular Disease

## 2018-10-30 NOTE — Telephone Encounter (Signed)
Left voicemail message requesting for patient to call back to schedule overdue F/U appointment with Dr. Fletcher Anon.

## 2018-10-30 NOTE — Telephone Encounter (Signed)
Requested Prescriptions   Signed Prescriptions Disp Refills  . losartan (COZAAR) 25 MG tablet 30 tablet 0    Sig: TAKE 1 TABLET BY MOUTH ONCE DAILY    Authorizing Provider: Kathlyn Sacramento A    Ordering User: Raelene Bott, BRANDY L

## 2018-12-10 ENCOUNTER — Other Ambulatory Visit: Payer: Self-pay

## 2018-12-13 ENCOUNTER — Other Ambulatory Visit
Admission: RE | Admit: 2018-12-13 | Discharge: 2018-12-13 | Disposition: A | Discharge status: Home / Self Care | Payer: Medicare Other | Source: Ambulatory Visit | Attending: Ophthalmology | Admitting: Ophthalmology

## 2018-12-13 ENCOUNTER — Other Ambulatory Visit: Payer: Self-pay

## 2018-12-13 DIAGNOSIS — Z20828 Contact with and (suspected) exposure to other viral communicable diseases: Secondary | ICD-10-CM | POA: Diagnosis not present

## 2018-12-13 DIAGNOSIS — Z01812 Encounter for preprocedural laboratory examination: Secondary | ICD-10-CM | POA: Insufficient documentation

## 2018-12-13 DIAGNOSIS — H2511 Age-related nuclear cataract, right eye: Secondary | ICD-10-CM | POA: Insufficient documentation

## 2018-12-14 LAB — SARS CORONAVIRUS 2 (TAT 6-24 HRS): SARS Coronavirus 2: NEGATIVE

## 2018-12-16 NOTE — Discharge Instructions (Signed)

## 2018-12-18 ENCOUNTER — Other Ambulatory Visit: Payer: Self-pay

## 2018-12-18 ENCOUNTER — Ambulatory Visit: Payer: Medicare Other | Admitting: Anesthesiology

## 2018-12-18 ENCOUNTER — Encounter
Admission: RE | Disposition: A | Discharge status: Home / Self Care | Payer: Self-pay | Source: Home / Self Care | Attending: Ophthalmology

## 2018-12-18 ENCOUNTER — Ambulatory Visit
Admission: RE | Admit: 2018-12-18 | Discharge: 2018-12-18 | Disposition: A | Discharge status: Home / Self Care | Payer: Medicare Other | Attending: Ophthalmology | Admitting: Ophthalmology

## 2018-12-18 DIAGNOSIS — Z7951 Long term (current) use of inhaled steroids: Secondary | ICD-10-CM | POA: Insufficient documentation

## 2018-12-18 DIAGNOSIS — J449 Chronic obstructive pulmonary disease, unspecified: Secondary | ICD-10-CM | POA: Insufficient documentation

## 2018-12-18 DIAGNOSIS — Z955 Presence of coronary angioplasty implant and graft: Secondary | ICD-10-CM | POA: Diagnosis not present

## 2018-12-18 DIAGNOSIS — I2510 Atherosclerotic heart disease of native coronary artery without angina pectoris: Secondary | ICD-10-CM | POA: Diagnosis not present

## 2018-12-18 DIAGNOSIS — H2511 Age-related nuclear cataract, right eye: Secondary | ICD-10-CM | POA: Insufficient documentation

## 2018-12-18 DIAGNOSIS — K219 Gastro-esophageal reflux disease without esophagitis: Secondary | ICD-10-CM | POA: Diagnosis not present

## 2018-12-18 DIAGNOSIS — Z79899 Other long term (current) drug therapy: Secondary | ICD-10-CM | POA: Diagnosis not present

## 2018-12-18 DIAGNOSIS — I252 Old myocardial infarction: Secondary | ICD-10-CM | POA: Diagnosis not present

## 2018-12-18 DIAGNOSIS — F17200 Nicotine dependence, unspecified, uncomplicated: Secondary | ICD-10-CM | POA: Diagnosis not present

## 2018-12-18 DIAGNOSIS — Z7982 Long term (current) use of aspirin: Secondary | ICD-10-CM | POA: Insufficient documentation

## 2018-12-18 DIAGNOSIS — E7800 Pure hypercholesterolemia, unspecified: Secondary | ICD-10-CM | POA: Diagnosis not present

## 2018-12-18 HISTORY — DX: Chronic cough: R05.3

## 2018-12-18 HISTORY — DX: Gastro-esophageal reflux disease without esophagitis: K21.9

## 2018-12-18 HISTORY — DX: Cardiac murmur, unspecified: R01.1

## 2018-12-18 HISTORY — DX: Chronic obstructive pulmonary disease, unspecified: J44.9

## 2018-12-18 HISTORY — DX: Cardiac arrhythmia, unspecified: I49.9

## 2018-12-18 HISTORY — PX: CATARACT EXTRACTION W/PHACO: SHX586

## 2018-12-18 HISTORY — DX: Unspecified chronic bronchitis: J42

## 2018-12-18 HISTORY — DX: Presence of coronary angioplasty implant and graft: Z95.5

## 2018-12-18 HISTORY — DX: Presence of dental prosthetic device (complete) (partial): Z97.2

## 2018-12-18 SURGERY — PHACOEMULSIFICATION, CATARACT, WITH IOL INSERTION
Anesthesia: Monitor Anesthesia Care | Site: Eye | Laterality: Right

## 2018-12-18 MED ORDER — NA HYALUR & NA CHOND-NA HYALUR 0.4-0.35 ML IO KIT
PACK | INTRAOCULAR | Status: DC | PRN
  Administered 2018-12-18: 1 mL via INTRAOCULAR

## 2018-12-18 MED ORDER — LIDOCAINE HCL (PF) 2 % IJ SOLN
INTRAOCULAR | Status: DC | PRN
  Administered 2018-12-18: 1 mL

## 2018-12-18 MED ORDER — FENTANYL CITRATE (PF) 100 MCG/2ML IJ SOLN
INTRAMUSCULAR | Status: DC | PRN
  Administered 2018-12-18: 50 ug via INTRAVENOUS

## 2018-12-18 MED ORDER — MOXIFLOXACIN HCL 0.5 % OP SOLN
1.0000 [drp] | OPHTHALMIC | Status: DC | PRN
  Administered 2018-12-18 (×3): 1 [drp] via OPHTHALMIC

## 2018-12-18 MED ORDER — ARMC OPHTHALMIC DILATING DROPS
1.0000 "application " | OPHTHALMIC | Status: DC | PRN
  Administered 2018-12-18 (×3): 1 via OPHTHALMIC

## 2018-12-18 MED ORDER — TETRACAINE HCL 0.5 % OP SOLN
1.0000 [drp] | OPHTHALMIC | Status: DC | PRN
  Administered 2018-12-18 (×3): 1 [drp] via OPHTHALMIC

## 2018-12-18 MED ORDER — ACETAMINOPHEN 160 MG/5ML PO SOLN: 325.0000 mg | Freq: Once | ORAL | Status: DC

## 2018-12-18 MED ORDER — MIDAZOLAM HCL 2 MG/2ML IJ SOLN
INTRAMUSCULAR | Status: DC | PRN
  Administered 2018-12-18 (×2): 1 mg via INTRAVENOUS

## 2018-12-18 MED ORDER — EPINEPHRINE PF 1 MG/ML IJ SOLN
INTRAOCULAR | Status: DC | PRN
  Administered 2018-12-18: 58 mL via OPHTHALMIC

## 2018-12-18 MED ORDER — ACETAMINOPHEN 325 MG PO TABS: 325.0000 mg | ORAL_TABLET | Freq: Once | ORAL | Status: DC

## 2018-12-18 MED ORDER — BRIMONIDINE TARTRATE-TIMOLOL 0.2-0.5 % OP SOLN
OPHTHALMIC | Status: DC | PRN
  Administered 2018-12-18: 1 [drp] via OPHTHALMIC

## 2018-12-18 MED ORDER — MOXIFLOXACIN HCL 0.5 % OP SOLN
OPHTHALMIC | Status: DC | PRN
  Administered 2018-12-18: 0.2 mL via OPHTHALMIC

## 2018-12-18 SURGICAL SUPPLY — 26 items
CANNULA ANT/CHMB 27G (MISCELLANEOUS) ×1 IMPLANT
CANNULA ANT/CHMB 27GA (MISCELLANEOUS) ×3 IMPLANT
GLOVE SURG LX 7.5 STRW (GLOVE) ×4
GLOVE SURG LX STRL 7.5 STRW (GLOVE) ×1 IMPLANT
GLOVE SURG TRIUMPH 8.0 PF LTX (GLOVE) ×3 IMPLANT
GOWN STRL REUS W/ TWL LRG LVL3 (GOWN DISPOSABLE) ×2 IMPLANT
GOWN STRL REUS W/TWL LRG LVL3 (GOWN DISPOSABLE) ×4
LENS IOL TECNIS ITEC 19.5 (Intraocular Lens) ×2 IMPLANT
MARKER SKIN DUAL TIP RULER LAB (MISCELLANEOUS) ×3 IMPLANT
NDL FILTER BLUNT 18X1 1/2 (NEEDLE) IMPLANT
NDL RETROBULBAR .5 NSTRL (NEEDLE) IMPLANT
NEEDLE FILTER BLUNT 18X 1/2SAF (NEEDLE)
NEEDLE FILTER BLUNT 18X1 1/2 (NEEDLE) IMPLANT
PACK CATARACT BRASINGTON (MISCELLANEOUS) ×3 IMPLANT
PACK EYE AFTER SURG (MISCELLANEOUS) ×3 IMPLANT
PACK OPTHALMIC (MISCELLANEOUS) ×3 IMPLANT
RING MALYGIN 7.0 (MISCELLANEOUS) IMPLANT
SUT ETHILON 10-0 CS-B-6CS-B-6 (SUTURE)
SUT VICRYL  9 0 (SUTURE)
SUT VICRYL 9 0 (SUTURE) IMPLANT
SUTURE EHLN 10-0 CS-B-6CS-B-6 (SUTURE) IMPLANT
SYR 3ML LL SCALE MARK (SYRINGE) ×3 IMPLANT
SYR 5ML LL (SYRINGE) IMPLANT
SYR TB 1ML LUER SLIP (SYRINGE) ×3 IMPLANT
WATER STERILE IRR 500ML POUR (IV SOLUTION) ×3 IMPLANT
WIPE NON LINTING 3.25X3.25 (MISCELLANEOUS) ×3 IMPLANT

## 2018-12-18 NOTE — Anesthesia Postprocedure Evaluation (Signed)
Anesthesia Post Note  Patient: Dentist  Procedure(s) Performed: CATARACT EXTRACTION PHACO AND INTRAOCULAR LENS PLACEMENT ([place])  RIGHT (Right Eye)  Patient location during evaluation: PACU Anesthesia Type: MAC Level of consciousness: awake and alert and oriented Pain management: satisfactory to patient Vital Signs Assessment: post-procedure vital signs reviewed and stable Respiratory status: spontaneous breathing, nonlabored ventilation and respiratory function stable Cardiovascular status: blood pressure returned to baseline and stable Postop Assessment: Adequate PO intake and No signs of nausea or vomiting Anesthetic complications: no    Raliegh Ip

## 2018-12-18 NOTE — Anesthesia Preprocedure Evaluation (Signed)
Anesthesia Evaluation  Patient identified by MRN, date of birth, ID band Patient awake    Reviewed: Allergy & Precautions, H&P , NPO status , Patient's Chart, lab work & pertinent test results  Airway Mallampati: II  TM Distance: >3 FB Neck ROM: full    Dental  (+) Edentulous Upper, Edentulous Lower   Pulmonary COPD,  COPD inhaler, Current Smoker and Patient abstained from smoking.,    Pulmonary exam normal breath sounds clear to auscultation       Cardiovascular + CAD and + Past MI  Normal cardiovascular exam Rhythm:regular Rate:Normal     Neuro/Psych PSYCHIATRIC DISORDERS    GI/Hepatic   Endo/Other    Renal/GU      Musculoskeletal   Abdominal   Peds  Hematology   Anesthesia Other Findings   Reproductive/Obstetrics                             Anesthesia Physical Anesthesia Plan  ASA: III  Anesthesia Plan: MAC   Post-op Pain Management:    Induction:   PONV Risk Score and Plan: 2 and Midazolam, TIVA and Treatment may vary due to age or medical condition  Airway Management Planned:   Additional Equipment:   Intra-op Plan:   Post-operative Plan:   Informed Consent: I have reviewed the patients History and Physical, chart, labs and discussed the procedure including the risks, benefits and alternatives for the proposed anesthesia with the patient or authorized representative who has indicated his/her understanding and acceptance.       Plan Discussed with: CRNA  Anesthesia Plan Comments:         Anesthesia Quick Evaluation

## 2018-12-18 NOTE — Op Note (Signed)
LOCATION:  [place]   PREOPERATIVE DIAGNOSIS:    Nuclear sclerotic cataract right eye. H25.11   POSTOPERATIVE DIAGNOSIS:  Nuclear sclerotic cataract right eye.     PROCEDURE:  Phacoemusification with posterior chamber intraocular lens placement of the right eye   LENS:   Implant Name Type Inv. Item Serial No. Manufacturer Lot No. LRB No. Used Action  LENS IOL DIOP 19.5 - [id number] Intraocular Lens LENS IOL DIOP 19.5 [id number] AMO  Right 1 Implanted        ULTRASOUND TIME: 14 % of 0 minutes, 50 seconds.  CDE 6.9   SURGEON:  Wyonia Hough, MD   ANESTHESIA:  Topical with tetracaine drops and 2% Xylocaine jelly, augmented with 1% preservative-free intracameral lidocaine.    COMPLICATIONS:  None.   DESCRIPTION OF PROCEDURE:  The patient was identified in the holding room and transported to the operating room and placed in the supine position under the operating microscope.  The right eye was identified as the operative eye and it was prepped and draped in the usual sterile ophthalmic fashion.   A 1 millimeter clear-corneal paracentesis was made at the 12:00 position.  0.5 ml of preservative-free 1% lidocaine was injected into the anterior chamber. The anterior chamber was filled with Viscoat viscoelastic.  A 2.4 millimeter keratome was used to make a near-clear corneal incision at the 9:00 position.  A curvilinear capsulorrhexis was made with a cystotome and capsulorrhexis forceps.  Balanced salt solution was used to hydrodissect and hydrodelineate the nucleus.   Phacoemulsification was then used in stop and chop fashion to remove the lens nucleus and epinucleus.  The remaining cortex was then removed using the irrigation and aspiration handpiece. Provisc was then placed into the capsular bag to distend it for lens placement.  A lens was then injected into the capsular bag.  The remaining viscoelastic was aspirated.   Wounds were hydrated with balanced salt solution.   The anterior chamber was inflated to a physiologic pressure with balanced salt solution.  No wound leaks were noted. Vigamox 0.2 ml of a 1mg  per ml solution was injected into the anterior chamber for a dose of 0.2 mg of intracameral antibiotic at the completion of the case.   Timolol and Brimonidine drops were applied to the eye.  The patient was taken to the recovery room in stable condition without complications of anesthesia or surgery.   Lynd 12/18/2018, 11:06 AM

## 2018-12-18 NOTE — Anesthesia Procedure Notes (Signed)
Procedure Name: MAC Date/Time: 12/18/2018 10:53 AM Performed by: Cameron Ali, CRNA Pre-anesthesia Checklist: Patient identified, Emergency Drugs available, Suction available, Timeout performed and Patient being monitored Patient Re-evaluated:Patient Re-evaluated prior to induction Oxygen Delivery Method: Nasal cannula Placement Confirmation: positive ETCO2

## 2018-12-18 NOTE — H&P (Signed)

## 2018-12-18 NOTE — Transfer of Care (Signed)
Immediate Anesthesia Transfer of Care Note  Patient: Dentist  Procedure(s) Performed: CATARACT EXTRACTION PHACO AND INTRAOCULAR LENS PLACEMENT (IOC)  RIGHT (Right Eye)  Patient Location: PACU  Anesthesia Type: MAC  Level of Consciousness: awake, alert  and patient cooperative  Airway and Oxygen Therapy: Patient Spontanous Breathing and Patient connected to supplemental oxygen  Post-op Assessment: Post-op Vital signs reviewed, Patient's Cardiovascular Status Stable, Respiratory Function Stable, Patent Airway and No signs of Nausea or vomiting  Post-op Vital Signs: Reviewed and stable  Complications: No apparent anesthesia complications

## 2018-12-19 ENCOUNTER — Encounter: Payer: Self-pay | Admitting: Ophthalmology

## 2018-12-27 ENCOUNTER — Other Ambulatory Visit: Payer: Self-pay | Admitting: Cardiovascular Disease

## 2018-12-27 NOTE — Telephone Encounter (Signed)
Call attempted to schedule overdue F/U with Dr. Fletcher Anon. No answer. No voicemail.

## 2018-12-31 ENCOUNTER — Encounter: Payer: Self-pay | Admitting: *Deleted

## 2018-12-31 ENCOUNTER — Other Ambulatory Visit: Payer: Self-pay

## 2019-01-02 NOTE — Discharge Instructions (Signed)
General Anesthesia, Adult, Care After °This sheet gives you information about how to care for yourself after your procedure. Your health care provider may also give you more specific instructions. If you have problems or questions, contact your health care provider. °What can I expect after the procedure? °After the procedure, the following side effects are common: °· Pain or discomfort at the IV site. °· Nausea. °· Vomiting. °· Sore throat. °· Trouble concentrating. °· Feeling cold or chills. °· Weak or tired. °· Sleepiness and fatigue. °· Soreness and body aches. These side effects can affect parts of the body that were not involved in surgery. °Follow these instructions at home: ° °For at least 24 hours after the procedure: °· Have a responsible adult stay with you. It is important to have someone help care for you until you are awake and alert. °· Rest as needed. °· Do not: °? Participate in activities in which you could fall or become injured. °? Drive. °? Use heavy machinery. °? Drink alcohol. °? Take sleeping pills or medicines that cause drowsiness. °? Make important decisions or sign legal documents. °? Take care of children on your own. °Eating and drinking °· Follow any instructions from your health care provider about eating or drinking restrictions. °· When you feel hungry, start by eating small amounts of foods that are soft and easy to digest (bland), such as toast. Gradually return to your regular diet. °· Drink enough fluid to keep your urine pale yellow. °· If you vomit, rehydrate by drinking water, juice, or clear broth. °General instructions °· If you have sleep apnea, surgery and certain medicines can increase your risk for breathing problems. Follow instructions from your health care provider about wearing your sleep device: °? Anytime you are sleeping, including during daytime naps. °? While taking prescription pain medicines, sleeping medicines, or medicines that make you drowsy. °· Return to  your normal activities as told by your health care provider. Ask your health care provider what activities are safe for you. °· Take over-the-counter and prescription medicines only as told by your health care provider. °· If you smoke, do not smoke without supervision. °· Keep all follow-up visits as told by your health care provider. This is important. °Contact a health care provider if: °· You have nausea or vomiting that does not get better with medicine. °· You cannot eat or drink without vomiting. °· You have pain that does not get better with medicine. °· You are unable to pass urine. °· You develop a skin rash. °· You have a fever. °· You have redness around your IV site that gets worse. °Get help right away if: °· You have difficulty breathing. °· You have chest pain. °· You have blood in your urine or stool, or you vomit blood. °Summary °· After the procedure, it is common to have a sore throat or nausea. It is also common to feel tired. °· Have a responsible adult stay with you for the first 24 hours after general anesthesia. It is important to have someone help care for you until you are awake and alert. °· When you feel hungry, start by eating small amounts of foods that are soft and easy to digest (bland), such as toast. Gradually return to your regular diet. °· Drink enough fluid to keep your urine pale yellow. °· Return to your normal activities as told by your health care provider. Ask your health care provider what activities are safe for you. °This information is not   intended to replace advice given to you by your health care provider. Make sure you discuss any questions you have with your health care provider. °Document Released: 07/24/2000 Document Revised: 04/20/2017 Document Reviewed: 12/01/2016 °Elsevier Patient Education © 2020 [employer] °Cataract Surgery, Care After °This sheet gives you information about how to care for yourself after your procedure. Your health care provider may also  give you more specific instructions. If you have problems or questions, contact your health care provider. °What can I expect after the procedure? °After the procedure, it is common to have: °· Itching. °· Discomfort. °· Fluid discharge. °· Sensitivity to light and to touch. °· Bruising in or around the eye. °· Mild blurred vision. °Follow these instructions at home: °Eye care ° °· Do not touch or rub your eyes. °· Protect your eyes as told by your health care provider. You may be told to wear a protective eye shield or sunglasses. °· Do not put a contact lens into the affected eye or eyes until your health care provider approves. °· Keep the area around your eye clean and dry: °? Avoid swimming. °? Do not allow water to hit you directly in the face while showering. °? Keep soap and shampoo out of your eyes. °· Check your eye every day for signs of infection. Watch for: °? Redness, swelling, or pain. °? Fluid, blood, or pus. °? Warmth. °? A bad smell. °? Vision that is getting worse. °? Sensitivity that is getting worse. °Activity °· Do not drive for 24 hours if you were given a sedative during your procedure. °· Avoid strenuous activities, such as playing contact sports, for as long as told by your health care provider. °· Do not drive or use heavy machinery until your health care provider approves. °· Do not bend or lift heavy objects. Bending increases pressure in the eye. You can walk, climb stairs, and do light household chores. °· Ask your health care provider when you can return to work. If you work in a dusty environment, you may be advised to wear protective eyewear for a period of time. °General instructions °· Take or apply over-the-counter and prescription medicines only as told by your health care provider. This includes eye drops. °· Keep all follow-up visits as told by your health care provider. This is important. °Contact a health care provider if: °· You have increased bruising around your  eye. °· You have pain that is not helped with medicine. °· You have a fever. °· You have redness, swelling, or pain in your eye. °· You have fluid, blood, or pus coming from your incision. °· Your vision gets worse. °· Your sensitivity to light gets worse. °Get help right away if: °· You have sudden loss of vision. °· You see flashes of light or spots (floaters). °· You have severe eye pain. °· You develop nausea or vomiting. °Summary °· After your procedure, it is common to have itching, discomfort, bruising, fluid discharge, or sensitivity to light. °· Follow instructions from your health care provider about caring for your eye after the procedure. °· Do not rub your eye after the procedure. You may need to wear eye protection or sunglasses. Do not wear contact lenses. Keep the area around your eye clean and dry. °· Avoid activities that require a lot of effort. These include playing sports and lifting heavy objects. °· Contact a health care provider if you have increased bruising, pain that does not go away, or a fever. Get   help right away if you suddenly lose your vision, see flashes of light or spots, or have severe pain in the eye. °This information is not intended to replace advice given to you by your health care provider. Make sure you discuss any questions you have with your health care provider. °Document Released: 11/04/2004 Document Revised: 10/15/2017 Document Reviewed: 10/15/2017 °Elsevier Patient Education © 2020 Elsevier Inc. ° °

## 2019-01-03 ENCOUNTER — Other Ambulatory Visit
Admission: RE | Admit: 2019-01-03 | Discharge: 2019-01-03 | Disposition: A | Discharge status: Home / Self Care | Payer: Medicare Other | Source: Ambulatory Visit | Attending: Ophthalmology | Admitting: Ophthalmology

## 2019-01-03 ENCOUNTER — Other Ambulatory Visit: Payer: Self-pay

## 2019-01-03 DIAGNOSIS — Z20828 Contact with and (suspected) exposure to other viral communicable diseases: Secondary | ICD-10-CM | POA: Insufficient documentation

## 2019-01-03 DIAGNOSIS — Z01812 Encounter for preprocedural laboratory examination: Secondary | ICD-10-CM | POA: Diagnosis present

## 2019-01-04 LAB — SARS CORONAVIRUS 2 (TAT 6-24 HRS): SARS Coronavirus 2: NEGATIVE

## 2019-01-07 NOTE — Anesthesia Preprocedure Evaluation (Addendum)
Anesthesia Evaluation  Patient identified by MRN, date of birth, ID band Patient awake    Reviewed: Allergy & Precautions, NPO status , Patient's Chart, lab work & pertinent test results  History of Anesthesia Complications Negative for: history of anesthetic complications  Airway Mallampati: III   Neck ROM: Full    Dental  (+) Edentulous Upper, Edentulous Lower   Pulmonary COPD, Current Smoker and Patient abstained from smoking.,    Pulmonary exam normal breath sounds clear to auscultation       Cardiovascular + CAD (s/p MI and stents)   Rhythm:Regular Rate:Normal + Systolic murmurs Ischemic cardiomyopathy; murmur   Neuro/Psych PSYCHIATRIC DISORDERS Anxiety Depression negative neurological ROS     GI/Hepatic GERD  ,  Endo/Other  negative endocrine ROS  Renal/GU negative Renal ROS     Musculoskeletal   Abdominal   Peds  Hematology negative hematology ROS (+)   Anesthesia Other Findings   Reproductive/Obstetrics                            Anesthesia Physical Anesthesia Plan  ASA: III  Anesthesia Plan: MAC   Post-op Pain Management:    Induction: Intravenous  PONV Risk Score and Plan: 1 and TIVA and Midazolam  Airway Management Planned: Natural Airway  Additional Equipment:   Intra-op Plan:   Post-operative Plan:   Informed Consent: I have reviewed the patients History and Physical, chart, labs and discussed the procedure including the risks, benefits and alternatives for the proposed anesthesia with the patient or authorized representative who has indicated his/her understanding and acceptance.       Plan Discussed with: CRNA  Anesthesia Plan Comments:        Anesthesia Quick Evaluation

## 2019-01-08 ENCOUNTER — Ambulatory Visit: Payer: Medicare Other | Admitting: Anesthesiology

## 2019-01-08 ENCOUNTER — Ambulatory Visit
Admission: RE | Admit: 2019-01-08 | Discharge: 2019-01-08 | Disposition: A | Discharge status: Home / Self Care | Payer: Medicare Other | Attending: Ophthalmology | Admitting: Ophthalmology

## 2019-01-08 ENCOUNTER — Encounter
Admission: RE | Disposition: A | Discharge status: Home / Self Care | Payer: Self-pay | Source: Home / Self Care | Attending: Ophthalmology

## 2019-01-08 DIAGNOSIS — K219 Gastro-esophageal reflux disease without esophagitis: Secondary | ICD-10-CM | POA: Diagnosis not present

## 2019-01-08 DIAGNOSIS — Z79899 Other long term (current) drug therapy: Secondary | ICD-10-CM | POA: Insufficient documentation

## 2019-01-08 DIAGNOSIS — Z955 Presence of coronary angioplasty implant and graft: Secondary | ICD-10-CM | POA: Diagnosis not present

## 2019-01-08 DIAGNOSIS — Z7951 Long term (current) use of inhaled steroids: Secondary | ICD-10-CM | POA: Diagnosis not present

## 2019-01-08 DIAGNOSIS — Z880 Allergy status to penicillin: Secondary | ICD-10-CM | POA: Diagnosis not present

## 2019-01-08 DIAGNOSIS — I255 Ischemic cardiomyopathy: Secondary | ICD-10-CM | POA: Insufficient documentation

## 2019-01-08 DIAGNOSIS — H2512 Age-related nuclear cataract, left eye: Secondary | ICD-10-CM | POA: Insufficient documentation

## 2019-01-08 DIAGNOSIS — F17200 Nicotine dependence, unspecified, uncomplicated: Secondary | ICD-10-CM | POA: Diagnosis not present

## 2019-01-08 DIAGNOSIS — I2510 Atherosclerotic heart disease of native coronary artery without angina pectoris: Secondary | ICD-10-CM | POA: Insufficient documentation

## 2019-01-08 DIAGNOSIS — J449 Chronic obstructive pulmonary disease, unspecified: Secondary | ICD-10-CM | POA: Diagnosis not present

## 2019-01-08 DIAGNOSIS — I252 Old myocardial infarction: Secondary | ICD-10-CM | POA: Diagnosis not present

## 2019-01-08 DIAGNOSIS — Z7982 Long term (current) use of aspirin: Secondary | ICD-10-CM | POA: Insufficient documentation

## 2019-01-08 HISTORY — PX: CATARACT EXTRACTION W/PHACO: SHX586

## 2019-01-08 SURGERY — PHACOEMULSIFICATION, CATARACT, WITH IOL INSERTION
Anesthesia: Monitor Anesthesia Care | Site: Eye | Laterality: Left

## 2019-01-08 MED ORDER — EPINEPHRINE PF 1 MG/ML IJ SOLN
INTRAOCULAR | Status: DC | PRN
  Administered 2019-01-08: 52 mL via OPHTHALMIC

## 2019-01-08 MED ORDER — BRIMONIDINE TARTRATE-TIMOLOL 0.2-0.5 % OP SOLN
OPHTHALMIC | Status: DC | PRN
  Administered 2019-01-08: 1 [drp] via OPHTHALMIC

## 2019-01-08 MED ORDER — MIDAZOLAM HCL 2 MG/2ML IJ SOLN
INTRAMUSCULAR | Status: DC | PRN
  Administered 2019-01-08: 1 mg via INTRAVENOUS

## 2019-01-08 MED ORDER — MOXIFLOXACIN HCL 0.5 % OP SOLN
OPHTHALMIC | Status: DC | PRN
  Administered 2019-01-08: 0.2 mL via OPHTHALMIC

## 2019-01-08 MED ORDER — LIDOCAINE HCL (PF) 2 % IJ SOLN
INTRAOCULAR | Status: DC | PRN
  Administered 2019-01-08: 2 mL

## 2019-01-08 MED ORDER — ONDANSETRON HCL 4 MG/2ML IJ SOLN: 4.0000 mg | Freq: Once | INTRAMUSCULAR | Status: DC | PRN

## 2019-01-08 MED ORDER — ARMC OPHTHALMIC DILATING DROPS
1.0000 "application " | OPHTHALMIC | Status: DC | PRN
  Administered 2019-01-08 (×3): 1 via OPHTHALMIC

## 2019-01-08 MED ORDER — ACETAMINOPHEN 325 MG PO TABS: 650.0000 mg | ORAL_TABLET | Freq: Once | ORAL | Status: DC | PRN

## 2019-01-08 MED ORDER — ACETAMINOPHEN 160 MG/5ML PO SOLN: 325.0000 mg | ORAL | Status: DC | PRN

## 2019-01-08 MED ORDER — NA HYALUR & NA CHOND-NA HYALUR 0.4-0.35 ML IO KIT
PACK | INTRAOCULAR | Status: DC | PRN
  Administered 2019-01-08: 1 mL via INTRAOCULAR

## 2019-01-08 MED ORDER — MOXIFLOXACIN HCL 0.5 % OP SOLN
1.0000 [drp] | OPHTHALMIC | Status: DC | PRN
  Administered 2019-01-08 (×3): 1 [drp] via OPHTHALMIC

## 2019-01-08 MED ORDER — TETRACAINE HCL 0.5 % OP SOLN
1.0000 [drp] | OPHTHALMIC | Status: DC | PRN
  Administered 2019-01-08 (×3): 1 [drp] via OPHTHALMIC

## 2019-01-08 MED ORDER — FENTANYL CITRATE (PF) 100 MCG/2ML IJ SOLN
INTRAMUSCULAR | Status: DC | PRN
  Administered 2019-01-08: 50 ug via INTRAVENOUS

## 2019-01-08 SURGICAL SUPPLY — 19 items
CANNULA ANT/CHMB 27G (MISCELLANEOUS) ×1 IMPLANT
CANNULA ANT/CHMB 27GA (MISCELLANEOUS) ×3 IMPLANT
GLOVE SURG LX 7.5 STRW (GLOVE) ×2
GLOVE SURG LX STRL 7.5 STRW (GLOVE) ×1 IMPLANT
GLOVE SURG TRIUMPH 8.0 PF LTX (GLOVE) ×3 IMPLANT
GOWN STRL REUS W/ TWL LRG LVL3 (GOWN DISPOSABLE) ×2 IMPLANT
GOWN STRL REUS W/TWL LRG LVL3 (GOWN DISPOSABLE) ×4
LENS IOL TECNIS ITEC 20.0 (Intraocular Lens) ×2 IMPLANT
MARKER SKIN DUAL TIP RULER LAB (MISCELLANEOUS) ×3 IMPLANT
NDL FILTER BLUNT 18X1 1/2 (NEEDLE) IMPLANT
NEEDLE FILTER BLUNT 18X 1/2SAF (NEEDLE) ×4
NEEDLE FILTER BLUNT 18X1 1/2 (NEEDLE) ×2 IMPLANT
PACK CATARACT BRASINGTON (MISCELLANEOUS) ×3 IMPLANT
PACK EYE AFTER SURG (MISCELLANEOUS) ×3 IMPLANT
PACK OPTHALMIC (MISCELLANEOUS) ×3 IMPLANT
SYR 3ML LL SCALE MARK (SYRINGE) ×5 IMPLANT
SYR TB 1ML LUER SLIP (SYRINGE) ×3 IMPLANT
WATER STERILE IRR 500ML POUR (IV SOLUTION) ×3 IMPLANT
WIPE NON LINTING 3.25X3.25 (MISCELLANEOUS) ×3 IMPLANT

## 2019-01-08 NOTE — Anesthesia Postprocedure Evaluation (Signed)
Anesthesia Post Note  Patient: Dentist  Procedure(s) Performed: CATARACT EXTRACTION PHACO AND INTRAOCULAR LENS PLACEMENT (IOC) LEFT (Left Eye)  Patient location during evaluation: PACU Anesthesia Type: MAC Level of consciousness: awake and alert, oriented and patient cooperative Pain management: pain level controlled Vital Signs Assessment: post-procedure vital signs reviewed and stable Respiratory status: spontaneous breathing, nonlabored ventilation and respiratory function stable Cardiovascular status: blood pressure returned to baseline and stable Postop Assessment: adequate PO intake Anesthetic complications: no    Darrin Nipper

## 2019-01-08 NOTE — Anesthesia Procedure Notes (Signed)
Procedure Name: MAC Date/Time: 01/08/2019 12:28 PM Performed by: Georga Bora, CRNA Pre-anesthesia Checklist: Patient identified, Emergency Drugs available, Suction available, Patient being monitored and Timeout performed Patient Re-evaluated:Patient Re-evaluated prior to induction Oxygen Delivery Method: Nasal cannula

## 2019-01-08 NOTE — Op Note (Signed)
OPERATIVE NOTE  [name] [id number] 01/08/2019   PREOPERATIVE DIAGNOSIS:  Nuclear sclerotic cataract left eye. H25.12   POSTOPERATIVE DIAGNOSIS:    Nuclear sclerotic cataract left eye.     PROCEDURE:  Phacoemusification with posterior chamber intraocular lens placement of the left eye   LENS:   Implant Name Type Inv. Item Serial No. Manufacturer Lot No. LRB No. Used Action  LENS IOL DIOP 20.0 - [id number] Intraocular Lens LENS IOL DIOP 20.0 [id number]  Left 1 Implanted       @  ULTRASOUND TIME: 14  % of 0 minutes 43 seconds, CDE 6.2  SURGEON:  Wyonia Hough, MD   ANESTHESIA:  Topical with tetracaine drops and 2% Xylocaine jelly, augmented with 1% preservative-free intracameral lidocaine.    COMPLICATIONS:  None.   DESCRIPTION OF PROCEDURE:  The patient was identified in the holding room and transported to the operating room and placed in the supine position under the operating microscope.  The left eye was identified as the operative eye and it was prepped and draped in the usual sterile ophthalmic fashion.   A 1 millimeter clear-corneal paracentesis was made at the 1:30 position.  0.5 ml of preservative-free 1% lidocaine was injected into the anterior chamber.  The anterior chamber was filled with Viscoat viscoelastic.  A 2.4 millimeter keratome was used to make a near-clear corneal incision at the 10:30 position.  .  A curvilinear capsulorrhexis was made with a cystotome and capsulorrhexis forceps.  Balanced salt solution was used to hydrodissect and hydrodelineate the nucleus.   Phacoemulsification was then used in stop and chop fashion to remove the lens nucleus and epinucleus.  The remaining cortex was then removed using the irrigation and aspiration handpiece. Provisc was then placed into the capsular bag to distend it for lens placement.  A lens was then injected into the capsular bag.  The remaining viscoelastic was aspirated.   Wounds were hydrated  with balanced salt solution.  The anterior chamber was inflated to a physiologic pressure with balanced salt solution.  No wound leaks were noted. Vigamox 0.2 ml of a 1mg  per ml solution was injected into the anterior chamber for a dose of 0.2 mg of intracameral antibiotic at the completion of the case.   Timolol and Brimonidine drops were applied to the eye.  The patient was taken to the recovery room in stable condition without complications of anesthesia or surgery.  [name] 01/08/2019, 12:44 PM

## 2019-01-08 NOTE — H&P (Signed)

## 2019-01-08 NOTE — Transfer of Care (Signed)
Immediate Anesthesia Transfer of Care Note  Patient: Dentist  Procedure(s) Performed: CATARACT EXTRACTION PHACO AND INTRAOCULAR LENS PLACEMENT (IOC) LEFT (Left Eye)  Patient Location: PACU  Anesthesia Type: MAC  Level of Consciousness: awake, alert  and patient cooperative  Airway and Oxygen Therapy: Patient Spontanous Breathing and Patient connected to supplemental oxygen  Post-op Assessment: Post-op Vital signs reviewed, Patient's Cardiovascular Status Stable, Respiratory Function Stable, Patent Airway and No signs of Nausea or vomiting  Post-op Vital Signs: Reviewed and stable  Complications: No apparent anesthesia complications

## 2019-01-09 ENCOUNTER — Encounter: Payer: Self-pay | Admitting: Ophthalmology

## 2019-01-27 ENCOUNTER — Other Ambulatory Visit: Payer: Self-pay

## 2019-01-27 MED ORDER — LOSARTAN POTASSIUM 25 MG PO TABS: 25.0000 mg | ORAL_TABLET | Freq: Every day | ORAL | 0 refills | Status: DC

## 2019-01-27 NOTE — Telephone Encounter (Signed)
*  STAT* If patient is at the pharmacy, call can be transferred to refill team.   1. Which medications need to be refilled? (please list name of each medication and dose if known) Losartan  2. Which pharmacy/location (including street and city if local pharmacy) is medication to be sent to?[place] 3. Do they need a 30 day or 90 day supply? [place]

## 2019-02-03 ENCOUNTER — Other Ambulatory Visit: Payer: Self-pay | Admitting: Cardiovascular Disease

## 2019-02-11 ENCOUNTER — Other Ambulatory Visit: Payer: Self-pay | Admitting: Internal Medicine

## 2019-03-06 ENCOUNTER — Other Ambulatory Visit: Payer: Self-pay | Admitting: Internal Medicine

## 2019-03-11 ENCOUNTER — Ambulatory Visit (INDEPENDENT_AMBULATORY_CARE_PROVIDER_SITE_OTHER): Payer: Medicare Other | Admitting: Cardiovascular Disease

## 2019-03-11 ENCOUNTER — Encounter: Payer: Self-pay | Admitting: Cardiovascular Disease

## 2019-03-11 ENCOUNTER — Other Ambulatory Visit: Payer: Self-pay

## 2019-03-11 VITALS — BP 130/100 | HR 68 | Ht 64.0 in | Wt 183.5 lb

## 2019-03-11 DIAGNOSIS — I2510 Atherosclerotic heart disease of native coronary artery without angina pectoris: Secondary | ICD-10-CM | POA: Diagnosis not present

## 2019-03-11 DIAGNOSIS — I255 Ischemic cardiomyopathy: Secondary | ICD-10-CM | POA: Diagnosis not present

## 2019-03-11 DIAGNOSIS — E782 Mixed hyperlipidemia: Secondary | ICD-10-CM

## 2019-03-11 DIAGNOSIS — Z23 Encounter for immunization: Secondary | ICD-10-CM

## 2019-03-11 DIAGNOSIS — Z720 Tobacco use: Secondary | ICD-10-CM

## 2019-03-11 MED ORDER — LOSARTAN POTASSIUM 50 MG PO TABS: 50.0000 mg | ORAL_TABLET | Freq: Every day | ORAL | 1 refills | Status: DC

## 2019-03-11 NOTE — Patient Instructions (Addendum)
Medication Instructions:  Your physician has recommended you make the following change in your medication:   INCREASE Losartan to 50mg  daily. An Rx has been sent to your pharmacy.  *If you need a refill on your cardiac medications before your next appointment, please call your pharmacy*  Lab Work: Lipid and CMP today. Please stop at the medical mall to have yours labs drawn.  Flus shot today. If you have labs (blood work) drawn today and your tests are completely normal, you will receive your results only by: [name] MyChart Message (if you have MyChart) OR . A paper copy in the mail If you have any lab test that is abnormal or we need to change your treatment, we will call you to review the results.  Testing/Procedures: Non ordered  Follow-Up: At [facility], you and your health needs are our priority.  As part of our continuing mission to provide you with exceptional heart care, we have created designated Provider Care Teams.  These Care Teams include your primary Cardiologist (physician) and Advanced Practice Providers (APPs -  Physician Assistants and Nurse Practitioners) who all work together to provide you with the care you need, when you need it.  Your next appointment:   6 months  The format for your next appointment:   In Person  Provider:    You may see Dr. Fletcher Anon or one of the following Advanced Practice Providers on your designated Care Team:    Murray Hodgkins, NP  Christell Faith, PA-C  Marrianne Mood, PA-C   Other Instructions N/A

## 2019-03-11 NOTE — Progress Notes (Signed)
Cardiology Office Note   Date:  03/11/2019   ID:  [name], DOB 10/25/1955, MRN [id number]  PCP:  Glori Luis, MD  Cardiologist:   Lorine Bears, MD   Chief Complaint  Patient presents with  . Other    6 month f/u no complaints today. Meds reviewed verbally with pt.      History of Present Illness: [name] is a 63 y.o. female who presents for a follow-up visit regarding coronary artery disease and ischemic cardiomyopathy. She presented in June of 2017 with a small non-ST elevation myocardial infarction. She underwent cardiac catheterization which showed severe proximal and mid LAD disease. No other obstructive disease. She underwent successful angioplasty and 2 overlapped drug-eluting stent placement to the LAD without complications. Ejection fraction on LV gram was 30-35% but improved an echocardiogram before hospital discharge to 50%. She has other chronic medical conditions that include severe hyperlipidemia, essential hypertension, COPD and tobacco use.  She has been doing well with no recent chest pain, shortness of breath or palpitations.  She is tolerating Praluent with no side effects.  She continues to smoke. She had atypical right leg pain.  ABI in December of last year was normal bilaterally.  Past Medical History:  Diagnosis Date  . CAD (coronary artery disease)    a. NSTEMI 10/03/15; b. cardiac cath 10/04/15: LM nl, ost-pLAD 90% s/p PCI/DES, mLAD 80% s/p PCI/DES, mRCA 30%, D1,2,3 nl, p-mLCX 30%, OM1 &2 min irregs, OM3 normal, EF 30-35%, sev HK of mid-distal ant, apical, inf wall, mod elevated LVEDP  . Chronic bronchitis (HCC)   . Chronic cough   . COPD (chronic obstructive pulmonary disease) (HCC)   . Dysrhythmia   . GERD (gastroesophageal reflux disease)   . Heart murmur   . HLD (hyperlipidemia)   . Ischemic cardiomyopathy    a. echo 10/05/15: EF 50%, mild HK of anteroseptal and anterior wall, nl LV diastolic fxn  . Myocardial infarction  (HCC) 09/2015  . Status post primary angioplasty with coronary stent   . Tobacco abuse   . Wears dentures    full upper and lower    Past Surgical History:  Procedure Laterality Date  . CARDIAC CATHETERIZATION N/A 10/04/2015   Procedure: Left Heart Cath and Coronary Angiography;  Surgeon: Iran Ouch, MD;  Location: [facility] INVASIVE CV LAB;  Service: Cardiovascular;  Laterality: N/A;  . CARDIAC CATHETERIZATION N/A 10/04/2015   Procedure: Coronary Stent Intervention;  Surgeon: Iran Ouch, MD;  Location: [facility] INVASIVE CV LAB;  Service: Cardiovascular;  Laterality: N/A;  . CATARACT EXTRACTION W/PHACO Right 12/18/2018   Procedure: CATARACT EXTRACTION PHACO AND INTRAOCULAR LENS PLACEMENT (IOC)  RIGHT;  Surgeon: Lockie Mola, MD;  Location: [facility];  Service: Ophthalmology;  Laterality: Right;  . CATARACT EXTRACTION W/PHACO Left 01/08/2019   Procedure: CATARACT EXTRACTION PHACO AND INTRAOCULAR LENS PLACEMENT (IOC) LEFT;  Surgeon: Lockie Mola, MD;  Location: [facility];  Service: Ophthalmology;  Laterality: Left;  leave arrival at 10:30  Total Time: 00:43.0 Total Equivalent Power: 14.4% Cumulative Dissipated Energy: 6.23     Current Outpatient Medications  Medication Sig Dispense Refill  . albuterol (VENTOLIN HFA) 108 (90 Base) MCG/ACT inhaler INHALE 2 PUFFS INTO THE LUNGS EVERY 6 HOURS AS NEEDED FOR WHEEZING OR SHORTNESS OF BREATH. 8.5 g 0  . Alirocumab (PRALUENT) 75 MG/ML SOAJ Inject 75 mg into the skin every 14 (fourteen) days. 2 pen 11  . aspirin 81 MG chewable tablet Chew 1 tablet (81 mg  total) by mouth daily.    . benzonatate (TESSALON) 100 MG capsule TAKE 2 CAPSULES BY MOUTH EVERY NIGHT AT BEDTIME 60 capsule 1  . carvedilol (COREG) 3.125 MG tablet TAKE 1 TABLET BY MOUTH TWICE A DAY 180 tablet 3  . losartan (COZAAR) 25 MG tablet Take 1 tablet (25 mg total) by mouth daily. PLEASE KEEP UPCOMING APPOINTMENT FOR FURTHER REFILLS. THANK YOU! 30 tablet 0   . omeprazole (PRILOSEC) 20 MG capsule TAKE 1 CAPSULE BY MOUTH TWICE DAILY BEFORE MEALS 180 capsule 3  . rosuvastatin (CRESTOR) 40 MG tablet TAKE 1 TABLET BY MOUTH ONCE A DAY 90 tablet 1  . SYMBICORT 160-4.5 MCG/ACT inhaler INHALE 1 PUFF INTO THE LUNGS 2 TIMES DAILY. RINSE MOUTH AFTER USE. 10.2 g 0   No current facility-administered medications for this visit.     Allergies:   Penicillins    Social History:  The patient  reports that she has been smoking cigarettes. She has a 21.00 pack-year smoking history. She has never used smokeless tobacco. She reports current alcohol use of about 5.0 standard drinks of alcohol per week. She reports that she does not use drugs.   Family History:  The patient's family history includes CAD in her mother; Colon cancer in her mother.    ROS:  Please see the history of present illness.   Otherwise, review of systems are positive for none.   All other systems are reviewed and negative.    PHYSICAL EXAM: VS:  BP (!) 130/100 (BP Location: Left Arm, Patient Position: Sitting, Cuff Size: Normal)   Pulse 68   Ht 5\' 4"  (1.626 m)   Wt 183 lb 8 oz (83.2 kg)   SpO2 98%   BMI 31.50 kg/m  , BMI Body mass index is 31.5 kg/m. GEN: Well nourished, well developed, in no acute distress  HEENT: normal  Neck: no JVD, carotid bruits, or masses Cardiac: RRR; no murmurs, rubs, or gallops,no edema  Respiratory:  clear to auscultation bilaterally, normal work of breathing GI: soft, nontender, nondistended, + BS MS: no deformity or atrophy  Skin: warm and dry, no rash Neuro:  Strength and sensation are intact Psych: euthymic mood, full affect Vascular: Femoral pulses mildly diminished on the right normal on the left.  Distal pulses are also weaker on the right than the left.   EKG:  EKG is ordered today. EKG showed sinus bradycardia with no significant ST or T wave changes.   Recent Labs: 04/15/2018: ALT 22; BUN 18; Creatinine, Ser 0.90; Potassium 4.8; Sodium 141     Lipid Panel    Component Value Date/Time   CHOL 228 (H) 04/15/2018 0811   TRIG 147 04/15/2018 0811   HDL 95 04/15/2018 0811   CHOLHDL 2.4 04/15/2018 0811   CHOLHDL 2.4 06/13/2016 1133   VLDL 26 06/13/2016 1133   LDLCALC 104 (H) 04/15/2018 0811      Wt Readings from Last 3 Encounters:  03/11/19 183 lb 8 oz (83.2 kg)  01/08/19 187 lb (84.8 kg)  12/18/18 188 lb (85.3 kg)        ASSESSMENT AND PLAN:  1.  Coronary artery disease involving native coronary arteries without angina: She is overall doing very well.  Continue medical therapy.  Continue aspirin indefinitely.  2. Ischemic cardiomyopathy with ejection fraction of 50%. Continue treatment with  small dose carvedilol.  Continue carvedilol and losartan.  3. Hyperlipidemia:   Continue high-dose rosuvastatin and Praluent.  I requested lipid and liver profile  4. Tobacco  use: She cut down but has not been able to quit completely.  I discussed with her the importance of smoking cessation.  5.  Essential hypertension: Blood pressure continues to be elevated.  I increased losartan to 50 mg daily.     Disposition:   FU with me in 6 months.     Signed,  Lorine Bears, MD  03/11/2019 4:46 PM    [facility]

## 2019-03-12 ENCOUNTER — Telehealth: Payer: Self-pay | Admitting: Family Medicine

## 2019-03-12 NOTE — Telephone Encounter (Signed)
-----   Message from Salvatore Marvel sent at 03/11/2019  2:14 PM EST ----- Regarding: remove pcp Pt has not been seen in 3+ years  Thank you

## 2019-03-12 NOTE — Telephone Encounter (Signed)
The provider has been removed.  °

## 2019-03-31 ENCOUNTER — Other Ambulatory Visit: Payer: Self-pay | Admitting: Cardiovascular Disease

## 2019-04-03 ENCOUNTER — Telehealth: Payer: Self-pay

## 2019-04-03 NOTE — Telephone Encounter (Signed)
Praluent 75mg /mL [id number] KEY: [id number]  Evaluation questions completed. Approved through 04/30/2020 per [email or web address]

## 2019-05-01 ENCOUNTER — Other Ambulatory Visit: Payer: Self-pay | Admitting: Cardiovascular Disease

## 2019-05-01 ENCOUNTER — Emergency Department
Admission: EM | Admit: 2019-05-01 | Discharge: 2019-05-01 | Disposition: A | Discharge status: Home / Self Care | Payer: Medicare Other | Attending: Emergency Medicine | Admitting: Emergency Medicine

## 2019-05-01 ENCOUNTER — Other Ambulatory Visit: Payer: Self-pay

## 2019-05-01 DIAGNOSIS — I2510 Atherosclerotic heart disease of native coronary artery without angina pectoris: Secondary | ICD-10-CM | POA: Diagnosis not present

## 2019-05-01 DIAGNOSIS — W57XXXA Bitten or stung by nonvenomous insect and other nonvenomous arthropods, initial encounter: Secondary | ICD-10-CM | POA: Insufficient documentation

## 2019-05-01 DIAGNOSIS — Y929 Unspecified place or not applicable: Secondary | ICD-10-CM | POA: Diagnosis not present

## 2019-05-01 DIAGNOSIS — Z7982 Long term (current) use of aspirin: Secondary | ICD-10-CM | POA: Insufficient documentation

## 2019-05-01 DIAGNOSIS — L089 Local infection of the skin and subcutaneous tissue, unspecified: Secondary | ICD-10-CM | POA: Insufficient documentation

## 2019-05-01 DIAGNOSIS — Z79899 Other long term (current) drug therapy: Secondary | ICD-10-CM | POA: Insufficient documentation

## 2019-05-01 DIAGNOSIS — F17210 Nicotine dependence, cigarettes, uncomplicated: Secondary | ICD-10-CM | POA: Diagnosis not present

## 2019-05-01 DIAGNOSIS — J449 Chronic obstructive pulmonary disease, unspecified: Secondary | ICD-10-CM | POA: Diagnosis not present

## 2019-05-01 DIAGNOSIS — Y9384 Activity, sleeping: Secondary | ICD-10-CM | POA: Insufficient documentation

## 2019-05-01 DIAGNOSIS — S40861A Insect bite (nonvenomous) of right upper arm, initial encounter: Secondary | ICD-10-CM | POA: Diagnosis present

## 2019-05-01 DIAGNOSIS — Y999 Unspecified external cause status: Secondary | ICD-10-CM | POA: Insufficient documentation

## 2019-05-01 MED ORDER — DIPHENHYDRAMINE HCL 25 MG PO CAPS
25.0000 mg | ORAL_CAPSULE | Freq: Once | ORAL | Status: AC
  Administered 2019-05-01: 11:00:00 25 mg via ORAL
  Filled 2019-05-01: qty 1

## 2019-05-01 MED ORDER — LIDOCAINE 5 % EX PTCH
1.0000 | MEDICATED_PATCH | CUTANEOUS | Status: DC
  Administered 2019-05-01: 1 via TRANSDERMAL
  Filled 2019-05-01: qty 1

## 2019-05-01 MED ORDER — TRAMADOL HCL 50 MG PO TABS: 50.0000 mg | ORAL_TABLET | Freq: Two times a day (BID) | ORAL | 0 refills | Status: DC | PRN

## 2019-05-01 MED ORDER — HYDROXYZINE HCL 10 MG/5ML PO SYRP: 10.0000 mg | ORAL_SOLUTION | Freq: Three times a day (TID) | ORAL | 0 refills | Status: DC | PRN

## 2019-05-01 MED ORDER — SULFAMETHOXAZOLE-TRIMETHOPRIM 800-160 MG PO TABS: 1.0000 | ORAL_TABLET | Freq: Two times a day (BID) | ORAL | 0 refills | Status: DC

## 2019-05-01 NOTE — ED Triage Notes (Signed)
Pt has COPD and chronic cough. Pt states she still smokes

## 2019-05-01 NOTE — Discharge Instructions (Addendum)
Follow discharge care instruction take medication as directed. °

## 2019-05-01 NOTE — ED Triage Notes (Signed)
Pt states she thinks she was bitten by something Tuesday. Right armpit red and swollen, redness and swelling extends into right breast.

## 2019-05-01 NOTE — ED Provider Notes (Signed)
[facility] Emergency Department Provider Note   ____________________________________________   First MD Initiated Contact with Patient 05/01/19 1106     (approximate)  I have reviewed the triage vital signs and the nursing notes.   HISTORY  Chief Complaint Abscess    HPI [name] is a 63 y.o. female patient presents with redness and swelling to the right axillary area.  Patient that she states was bit by an insect while asleep.  Redness radiating to the right lateral breast.  Patient rates pain as a 10/10.  Patient described pain as "sore".  No palliative measure for complaint.         Past Medical History:  Diagnosis Date  . CAD (coronary artery disease)    a. NSTEMI 10/03/15; b. cardiac cath 10/04/15: LM nl, ost-pLAD 90% s/p PCI/DES, mLAD 80% s/p PCI/DES, mRCA 30%, D1,2,3 nl, p-mLCX 30%, OM1 &2 min irregs, OM3 normal, EF 30-35%, sev HK of mid-distal ant, apical, inf wall, mod elevated LVEDP  . Chronic bronchitis (HCC)   . Chronic cough   . COPD (chronic obstructive pulmonary disease) (HCC)   . Dysrhythmia   . GERD (gastroesophageal reflux disease)   . Heart murmur   . HLD (hyperlipidemia)   . Ischemic cardiomyopathy    a. echo 10/05/15: EF 50%, mild HK of anteroseptal and anterior wall, nl LV diastolic fxn  . Myocardial infarction (HCC) 09/2015  . Status post primary angioplasty with coronary stent   . Tobacco abuse   . Wears dentures    full upper and lower    Patient Active Problem List   Diagnosis Date Noted  . Chronic cough 02/09/2016  . Trochanteric bursitis, right hip 02/09/2016  . Status post insertion of drug-eluting stent into left anterior descending (LAD) artery 10/26/2015  . Anxiety and depression 10/26/2015  . CAD (coronary artery disease)   . Ischemic cardiomyopathy   . HLD (hyperlipidemia)   . Tobacco abuse   . NSTEMI (non-ST elevated myocardial infarction) (HCC) 10/03/2015    Past Surgical History:  Procedure  Laterality Date  . CARDIAC CATHETERIZATION N/A 10/04/2015   Procedure: Left Heart Cath and Coronary Angiography;  Surgeon: Iran Ouch, MD;  Location: [facility] INVASIVE CV LAB;  Service: Cardiovascular;  Laterality: N/A;  . CARDIAC CATHETERIZATION N/A 10/04/2015   Procedure: Coronary Stent Intervention;  Surgeon: Iran Ouch, MD;  Location: [facility] INVASIVE CV LAB;  Service: Cardiovascular;  Laterality: N/A;  . CATARACT EXTRACTION W/PHACO Right 12/18/2018   Procedure: CATARACT EXTRACTION PHACO AND INTRAOCULAR LENS PLACEMENT (IOC)  RIGHT;  Surgeon: Lockie Mola, MD;  Location: [facility];  Service: Ophthalmology;  Laterality: Right;  . CATARACT EXTRACTION W/PHACO Left 01/08/2019   Procedure: CATARACT EXTRACTION PHACO AND INTRAOCULAR LENS PLACEMENT (IOC) LEFT;  Surgeon: Lockie Mola, MD;  Location: [facility];  Service: Ophthalmology;  Laterality: Left;  leave arrival at 10:30  Total Time: 00:43.0 Total Equivalent Power: 14.4% Cumulative Dissipated Energy: 6.23    Prior to Admission medications   Medication Sig Start Date End Date Taking? Authorizing Provider  albuterol (VENTOLIN HFA) 108 (90 Base) MCG/ACT inhaler INHALE 2 PUFFS INTO THE LUNGS EVERY 6 HOURS AS NEEDED FOR WHEEZING OR SHORTNESS OF BREATH. 03/06/19   Salena Saner, MD  Alirocumab (PRALUENT) 75 MG/ML SOAJ Inject 75 mg into the skin every 14 (fourteen) days. 05/03/18   Iran Ouch, MD  aspirin 81 MG chewable tablet Chew 1 tablet (81 mg total) by mouth daily. 10/05/15   Auburn Bilberry,  MD  benzonatate (TESSALON) 100 MG capsule TAKE 2 CAPSULES BY MOUTH EVERY NIGHT AT BEDTIME 11/20/17   Shane Crutch, MD  carvedilol (COREG) 3.125 MG tablet TAKE 1 TABLET BY MOUTH TWICE A DAY 10/03/18   Iran Ouch, MD  hydrOXYzine (ATARAX) 10 MG/5ML syrup Take 5 mLs (10 mg total) by mouth 3 (three) times daily as needed for itching. 05/01/19   Joni Reining, PA-C  losartan (COZAAR) 50 MG tablet Take 1  tablet (50 mg total) by mouth daily. 03/11/19   Iran Ouch, MD  omeprazole (PRILOSEC) 20 MG capsule TAKE 1 CAPSULE BY MOUTH TWICE DAILY BEFORE MEALS 10/03/18   Iran Ouch, MD  rosuvastatin (CRESTOR) 40 MG tablet TAKE 1 TABLET BY MOUTH ONCE A DAY 03/31/19   Iran Ouch, MD  sulfamethoxazole-trimethoprim (BACTRIM DS) 800-160 MG tablet Take 1 tablet by mouth 2 (two) times daily. 05/01/19   Joni Reining, PA-C  SYMBICORT 160-4.5 MCG/ACT inhaler INHALE 1 PUFF INTO THE LUNGS 2 TIMES DAILY. RINSE MOUTH AFTER USE. 03/06/19   Salena Saner, MD  traMADol (ULTRAM) 50 MG tablet Take 1 tablet (50 mg total) by mouth every 12 (twelve) hours as needed for up to 5 days. 05/01/19 05/06/19  Joni Reining, PA-C    Allergies Penicillins  Family History  Problem Relation Age of Onset  . CAD Mother   . Colon cancer Mother     Social History Social History   Tobacco Use  . Smoking status: Current Every Day Smoker    Packs/day: 0.50    Years: 42.00    Pack years: 21.00    Types: Cigarettes  . Smokeless tobacco: Never Used  . Tobacco comment: for 50 years now, currently cut back to 4 or less/day  Substance Use Topics  . Alcohol use: Yes    Alcohol/week: 5.0 standard drinks    Types: 2 Standard drinks or equivalent, 3 Cans of beer per week  . Drug use: No    Review of Systems Constitutional: No fever/chills Eyes: No visual changes. ENT: No sore throat. Cardiovascular: Denies chest pain. Respiratory: Denies shortness of breath. Gastrointestinal: No abdominal pain.  No nausea, no vomiting.  No diarrhea.  No constipation. Genitourinary: Negative for dysuria. Musculoskeletal: Negative for back pain. Skin: Negative for rash.  Redness and swollen right axillary area to right lateral breast. Neurological: Negative for headaches, focal weakness or numbness. Endocrine:  Hyperlipidemia Allergic/Immunilogical: Penicillin ____________________________________________   PHYSICAL  EXAM:  VITAL SIGNS: ED Triage Vitals [05/01/19 1105]  Enc Vitals Group     BP      Pulse      Resp      Temp      Temp src      SpO2      Weight 187 lb (84.8 kg)     Height 5\' 4"  (1.626 m)     Head Circumference      Peak Flow      Pain Score 10     Pain Loc      Pain Edu?      Excl. in GC?    Constitutional: Alert and oriented. Well appearing and in no acute distress.  Anxious Neck: No stridor.  No cervical spine tenderness to palpation. Hematological/Lymphatic/Immunilogical: No cervical lymphadenopathy. }Cardiovascular: Normal rate, regular rhythm. Grossly normal heart sounds.  Good peripheral circulation. Respiratory: Normal respiratory effort.  No retractions. Lungs CTAB. Neurologic:  Normal speech and language. No gross focal neurologic deficits are appreciated. No gait  instability. Skin:  Skin is warm, dry and intact.  Edema and erythema right axillary area radiating to right lateral breast.  Moderate guarding palpation of the right axillary area. Psychiatric: Mood and affect are normal. Speech and behavior are normal.  ____________________________________________   LABS (all labs ordered are listed, but only abnormal results are displayed)  Labs Reviewed - No data to display ____________________________________________  EKG   ____________________________________________  RADIOLOGY  ED MD interpretation:    Official radiology report(s): No results found.  ____________________________________________   PROCEDURES  Procedure(s) performed (including Critical Care):  Procedures   ____________________________________________   INITIAL IMPRESSION / ASSESSMENT AND PLAN / ED COURSE  As part of my medical decision making, I reviewed the following data within the [place]     Patient presents with right axillary pain radiating to the breast.  Patient physical exam consistent with mild cellulitis.  Patient given discharge care  instruction advised take medication as directed.  Patient advised to the ED if condition worsens.    [name] was evaluated in Emergency Department on 05/01/2019 for the symptoms described in the history of present illness. She was evaluated in the context of the global COVID-19 pandemic, which necessitated consideration that the patient might be at risk for infection with the SARS-CoV-2 virus that causes COVID-19. Institutional protocols and algorithms that pertain to the evaluation of patients at risk for COVID-19 are in a state of rapid change based on information released by regulatory bodies including the CDC and federal and state organizations. These policies and algorithms were followed during the patient's care in the ED.       ____________________________________________   FINAL CLINICAL IMPRESSION(S) / ED DIAGNOSES  Final diagnoses:  Bug bite with infection, initial encounter     ED Discharge Orders         Ordered    sulfamethoxazole-trimethoprim (BACTRIM DS) 800-160 MG tablet  2 times daily     05/01/19 1120    traMADol (ULTRAM) 50 MG tablet  Every 12 hours PRN     05/01/19 1120    hydrOXYzine (ATARAX) 10 MG/5ML syrup  3 times daily PRN     05/01/19 1120           Note:  This document was prepared using Dragon voice recognition software and may include unintentional dictation errors.    Sable Feil, PA-C 05/01/19 1125    Duffy Bruce, MD 05/01/19 [id number]

## 2019-05-04 ENCOUNTER — Other Ambulatory Visit: Payer: Self-pay

## 2019-05-04 ENCOUNTER — Inpatient Hospital Stay
Admission: EM | Admit: 2019-05-04 | Discharge: 2019-05-08 | DRG: 580 | Disposition: A | Discharge status: Home / Self Care | Payer: Medicare Other | Attending: Internal Medicine | Admitting: Internal Medicine

## 2019-05-04 ENCOUNTER — Emergency Department: Payer: Medicare Other

## 2019-05-04 DIAGNOSIS — Z955 Presence of coronary angioplasty implant and graft: Secondary | ICD-10-CM

## 2019-05-04 DIAGNOSIS — Z23 Encounter for immunization: Secondary | ICD-10-CM

## 2019-05-04 DIAGNOSIS — F17210 Nicotine dependence, cigarettes, uncomplicated: Secondary | ICD-10-CM | POA: Diagnosis present

## 2019-05-04 DIAGNOSIS — E785 Hyperlipidemia, unspecified: Secondary | ICD-10-CM | POA: Diagnosis present

## 2019-05-04 DIAGNOSIS — K219 Gastro-esophageal reflux disease without esophagitis: Secondary | ICD-10-CM | POA: Diagnosis present

## 2019-05-04 DIAGNOSIS — Z6835 Body mass index (BMI) 35.0-35.9, adult: Secondary | ICD-10-CM

## 2019-05-04 DIAGNOSIS — L02411 Cutaneous abscess of right axilla: Principal | ICD-10-CM | POA: Diagnosis present

## 2019-05-04 DIAGNOSIS — Z880 Allergy status to penicillin: Secondary | ICD-10-CM

## 2019-05-04 DIAGNOSIS — L0390 Cellulitis, unspecified: Secondary | ICD-10-CM | POA: Diagnosis not present

## 2019-05-04 DIAGNOSIS — L03119 Cellulitis of unspecified part of limb: Secondary | ICD-10-CM | POA: Diagnosis present

## 2019-05-04 DIAGNOSIS — E871 Hypo-osmolality and hyponatremia: Secondary | ICD-10-CM | POA: Diagnosis not present

## 2019-05-04 DIAGNOSIS — Z79899 Other long term (current) drug therapy: Secondary | ICD-10-CM

## 2019-05-04 DIAGNOSIS — N179 Acute kidney failure, unspecified: Secondary | ICD-10-CM | POA: Diagnosis present

## 2019-05-04 DIAGNOSIS — I255 Ischemic cardiomyopathy: Secondary | ICD-10-CM | POA: Diagnosis present

## 2019-05-04 DIAGNOSIS — J449 Chronic obstructive pulmonary disease, unspecified: Secondary | ICD-10-CM | POA: Diagnosis present

## 2019-05-04 DIAGNOSIS — I252 Old myocardial infarction: Secondary | ICD-10-CM

## 2019-05-04 DIAGNOSIS — M1990 Unspecified osteoarthritis, unspecified site: Secondary | ICD-10-CM | POA: Diagnosis present

## 2019-05-04 DIAGNOSIS — Z20822 Contact with and (suspected) exposure to covid-19: Secondary | ICD-10-CM | POA: Diagnosis present

## 2019-05-04 DIAGNOSIS — E669 Obesity, unspecified: Secondary | ICD-10-CM | POA: Diagnosis present

## 2019-05-04 DIAGNOSIS — B9562 Methicillin resistant Staphylococcus aureus infection as the cause of diseases classified elsewhere: Secondary | ICD-10-CM | POA: Diagnosis present

## 2019-05-04 DIAGNOSIS — N611 Abscess of the breast and nipple: Secondary | ICD-10-CM | POA: Diagnosis present

## 2019-05-04 DIAGNOSIS — R739 Hyperglycemia, unspecified: Secondary | ICD-10-CM | POA: Diagnosis present

## 2019-05-04 DIAGNOSIS — Z8249 Family history of ischemic heart disease and other diseases of the circulatory system: Secondary | ICD-10-CM

## 2019-05-04 DIAGNOSIS — L02419 Cutaneous abscess of limb, unspecified: Secondary | ICD-10-CM | POA: Diagnosis present

## 2019-05-04 DIAGNOSIS — E440 Moderate protein-calorie malnutrition: Secondary | ICD-10-CM | POA: Diagnosis not present

## 2019-05-04 DIAGNOSIS — N61 Mastitis without abscess: Secondary | ICD-10-CM

## 2019-05-04 DIAGNOSIS — L03313 Cellulitis of chest wall: Secondary | ICD-10-CM | POA: Diagnosis not present

## 2019-05-04 DIAGNOSIS — Z7982 Long term (current) use of aspirin: Secondary | ICD-10-CM

## 2019-05-04 DIAGNOSIS — I2510 Atherosclerotic heart disease of native coronary artery without angina pectoris: Secondary | ICD-10-CM | POA: Diagnosis not present

## 2019-05-04 DIAGNOSIS — K08109 Complete loss of teeth, unspecified cause, unspecified class: Secondary | ICD-10-CM | POA: Diagnosis present

## 2019-05-04 DIAGNOSIS — L03111 Cellulitis of right axilla: Secondary | ICD-10-CM | POA: Diagnosis present

## 2019-05-04 LAB — CBC WITH DIFFERENTIAL/PLATELET
Abs Immature Granulocytes: 0.05 10*3/uL (ref 0.00–0.07)
Basophils Absolute: 0.1 10*3/uL (ref 0.0–0.1)
Basophils Relative: 0 %
Eosinophils Absolute: 0.3 10*3/uL (ref 0.0–0.5)
Eosinophils Relative: 3 %
HCT: 37.5 % (ref 36.0–46.0)
Hemoglobin: 12 g/dL (ref 12.0–15.0)
Immature Granulocytes: 0 %
Lymphocytes Relative: 9 %
Lymphs Abs: 1.2 10*3/uL (ref 0.7–4.0)
MCH: 28.8 pg (ref 26.0–34.0)
MCHC: 32 g/dL (ref 30.0–36.0)
MCV: 90.1 fL (ref 80.0–100.0)
Monocytes Absolute: 0.7 10*3/uL (ref 0.1–1.0)
Monocytes Relative: 6 %
Neutro Abs: 10.5 10*3/uL — ABNORMAL HIGH (ref 1.7–7.7)
Neutrophils Relative %: 82 %
Platelets: 251 10*3/uL (ref 150–400)
RBC: 4.16 MIL/uL (ref 3.87–5.11)
RDW: 13.4 % (ref 11.5–15.5)
WBC: 12.9 10*3/uL — ABNORMAL HIGH (ref 4.0–10.5)
nRBC: 0 % (ref 0.0–0.2)

## 2019-05-04 LAB — COMPREHENSIVE METABOLIC PANEL
ALT: 20 U/L (ref 0–44)
AST: 29 U/L (ref 15–41)
Albumin: 3.4 g/dL — ABNORMAL LOW (ref 3.5–5.0)
Alkaline Phosphatase: 83 U/L (ref 38–126)
Anion gap: 12 (ref 5–15)
BUN: 22 mg/dL (ref 8–23)
CO2: 25 mmol/L (ref 22–32)
Calcium: 9.2 mg/dL (ref 8.9–10.3)
Chloride: 96 mmol/L — ABNORMAL LOW (ref 98–111)
Creatinine, Ser: 1.14 mg/dL — ABNORMAL HIGH (ref 0.44–1.00)
GFR calc Af Amer: 58 mL/min — ABNORMAL LOW (ref >60)
GFR calc non Af Amer: 50 mL/min — ABNORMAL LOW (ref >60)
Glucose, Bld: 114 mg/dL — ABNORMAL HIGH (ref 70–99)
Potassium: 3.9 mmol/L (ref 3.5–5.1)
Sodium: 133 mmol/L — ABNORMAL LOW (ref 135–145)
Total Bilirubin: 0.6 mg/dL (ref 0.3–1.2)
Total Protein: 8 g/dL (ref 6.5–8.1)

## 2019-05-04 LAB — RESPIRATORY PANEL BY RT PCR (FLU A&B, COVID)
Influenza A by PCR: NEGATIVE
Influenza B by PCR: NEGATIVE
SARS Coronavirus 2 by RT PCR: NEGATIVE

## 2019-05-04 LAB — LACTIC ACID, PLASMA: Lactic Acid, Venous: 1.2 mmol/L (ref 0.5–1.9)

## 2019-05-04 MED ORDER — VANCOMYCIN HCL 1750 MG/350ML IV SOLN
1750.0000 mg | Freq: Once | INTRAVENOUS | Status: AC
  Administered 2019-05-04: 1750 mg via INTRAVENOUS
  Filled 2019-05-04: qty 350

## 2019-05-04 MED ORDER — MORPHINE SULFATE (PF) 4 MG/ML IV SOLN
4.0000 mg | Freq: Once | INTRAVENOUS | Status: AC
  Administered 2019-05-04: 4 mg via INTRAVENOUS
  Filled 2019-05-04: qty 1

## 2019-05-04 MED ORDER — BENZONATATE 100 MG PO CAPS
200.0000 mg | ORAL_CAPSULE | Freq: Every day | ORAL | Status: DC
  Administered 2019-05-05 – 2019-05-07 (×4): 200 mg via ORAL
  Filled 2019-05-04 (×5): qty 2

## 2019-05-04 MED ORDER — PRO-STAT SUGAR FREE PO LIQD: 30.0000 mL | Freq: Two times a day (BID) | ORAL | Status: DC

## 2019-05-04 MED ORDER — ONDANSETRON HCL 4 MG/2ML IJ SOLN
4.0000 mg | Freq: Once | INTRAMUSCULAR | Status: AC
  Administered 2019-05-04: 4 mg via INTRAVENOUS
  Filled 2019-05-04: qty 2

## 2019-05-04 MED ORDER — SODIUM CHLORIDE 0.9% FLUSH: 3.0000 mL | INTRAVENOUS | Status: DC | PRN

## 2019-05-04 MED ORDER — LOSARTAN POTASSIUM 50 MG PO TABS
50.0000 mg | ORAL_TABLET | Freq: Every day | ORAL | Status: DC
  Administered 2019-05-07: 50 mg via ORAL
  Filled 2019-05-04 (×2): qty 1

## 2019-05-04 MED ORDER — TRAMADOL HCL 50 MG PO TABS
50.0000 mg | ORAL_TABLET | Freq: Two times a day (BID) | ORAL | Status: DC | PRN
  Administered 2019-05-05 – 2019-05-07 (×4): 50 mg via ORAL
  Filled 2019-05-04 (×4): qty 1

## 2019-05-04 MED ORDER — IOHEXOL 300 MG/ML  SOLN
75.0000 mL | Freq: Once | INTRAMUSCULAR | Status: AC | PRN
  Administered 2019-05-04: 75 mL via INTRAVENOUS
  Filled 2019-05-04: qty 75

## 2019-05-04 MED ORDER — PANTOPRAZOLE SODIUM 40 MG PO TBEC
40.0000 mg | DELAYED_RELEASE_TABLET | Freq: Every day | ORAL | Status: DC
  Administered 2019-05-05 – 2019-05-07 (×3): 40 mg via ORAL
  Filled 2019-05-04 (×4): qty 1

## 2019-05-04 MED ORDER — CARVEDILOL 3.125 MG PO TABS
3.1250 mg | ORAL_TABLET | Freq: Two times a day (BID) | ORAL | Status: DC
  Administered 2019-05-05 – 2019-05-08 (×5): 3.125 mg via ORAL
  Filled 2019-05-04 (×2): qty 1
  Filled 2019-05-04 (×2): qty 0.5
  Filled 2019-05-04 (×2): qty 1
  Filled 2019-05-04: qty 0.5
  Filled 2019-05-04: qty 1

## 2019-05-04 MED ORDER — ROSUVASTATIN CALCIUM 20 MG PO TABS
40.0000 mg | ORAL_TABLET | Freq: Every day | ORAL | Status: DC
  Administered 2019-05-05 – 2019-05-08 (×3): 40 mg via ORAL
  Filled 2019-05-04 (×2): qty 4
  Filled 2019-05-04: qty 2
  Filled 2019-05-04: qty 4
  Filled 2019-05-04 (×2): qty 2
  Filled 2019-05-04: qty 4
  Filled 2019-05-04: qty 2

## 2019-05-04 MED ORDER — MOMETASONE FURO-FORMOTEROL FUM 200-5 MCG/ACT IN AERO
2.0000 | INHALATION_SPRAY | Freq: Two times a day (BID) | RESPIRATORY_TRACT | Status: DC
  Administered 2019-05-05 – 2019-05-08 (×8): 2 via RESPIRATORY_TRACT
  Filled 2019-05-04 (×2): qty 8.8

## 2019-05-04 MED ORDER — ALBUTEROL SULFATE (2.5 MG/3ML) 0.083% IN NEBU: 2.5000 mg | INHALATION_SOLUTION | Freq: Four times a day (QID) | RESPIRATORY_TRACT | Status: DC | PRN

## 2019-05-04 MED ORDER — SODIUM CHLORIDE 0.9 % IV SOLN: 250.0000 mL | INTRAVENOUS | Status: DC | PRN

## 2019-05-04 MED ORDER — ACETAMINOPHEN 650 MG RE SUPP: 650.0000 mg | Freq: Four times a day (QID) | RECTAL | Status: DC | PRN

## 2019-05-04 MED ORDER — ENOXAPARIN SODIUM 40 MG/0.4ML ~~LOC~~ SOLN
40.0000 mg | SUBCUTANEOUS | Status: DC
  Administered 2019-05-05 (×2): 40 mg via SUBCUTANEOUS
  Filled 2019-05-04 (×2): qty 0.4

## 2019-05-04 MED ORDER — HYDROXYZINE HCL 10 MG PO TABS
10.0000 mg | ORAL_TABLET | Freq: Three times a day (TID) | ORAL | Status: DC | PRN
  Filled 2019-05-04 (×2): qty 1

## 2019-05-04 MED ORDER — ACETAMINOPHEN 325 MG PO TABS: 650.0000 mg | ORAL_TABLET | Freq: Four times a day (QID) | ORAL | Status: DC | PRN

## 2019-05-04 MED ORDER — SODIUM CHLORIDE 0.9% FLUSH
3.0000 mL | Freq: Two times a day (BID) | INTRAVENOUS | Status: DC
  Administered 2019-05-05 – 2019-05-08 (×4): 3 mL via INTRAVENOUS

## 2019-05-04 MED ORDER — ASPIRIN 81 MG PO CHEW
81.0000 mg | CHEWABLE_TABLET | Freq: Every day | ORAL | Status: DC
  Administered 2019-05-05 (×2): 81 mg via ORAL
  Filled 2019-05-04 (×2): qty 1

## 2019-05-04 NOTE — ED Notes (Addendum)
Pt A&Ox4. BP denies dizziness or nausea. BP currently soft. Given remote to tv. Bed locked low. Rails up. Call bell within reach. R side of pt's chest and under arm pit pinker than other skin around it; swollen. Lights dimmed for pt.

## 2019-05-04 NOTE — ED Provider Notes (Signed)
[facility] Emergency Department Provider Note  ____________________________________________   First MD Initiated Contact with Patient 05/04/19 1540     (approximate)  I have reviewed the triage vital signs and the nursing notes.   HISTORY  Chief Complaint No chief complaint on file.    HPI [name] is a 64 y.o. female presents emergency department complaining of worsening cellulitis to the right axilla.  She states she thought she was bit by something the other day and had a small red area.  She was started on Bactrim from the ED.  She has been taking the medicine as prescribed.  She states now the redness is gotten much worse, she cannot lift her right arm, she feels really sick.  She denies any vomiting or diarrhea.  She is unsure of a fever.  She denies chest pain/shortness of breath this time.    Past Medical History:  Diagnosis Date  . CAD (coronary artery disease)    a. NSTEMI 10/03/15; b. cardiac cath 10/04/15: LM nl, ost-pLAD 90% s/p PCI/DES, mLAD 80% s/p PCI/DES, mRCA 30%, D1,2,3 nl, p-mLCX 30%, OM1 &2 min irregs, OM3 normal, EF 30-35%, sev HK of mid-distal ant, apical, inf wall, mod elevated LVEDP  . Chronic bronchitis (HCC)   . Chronic cough   . COPD (chronic obstructive pulmonary disease) (HCC)   . Dysrhythmia   . GERD (gastroesophageal reflux disease)   . Heart murmur   . HLD (hyperlipidemia)   . Ischemic cardiomyopathy    a. echo 10/05/15: EF 50%, mild HK of anteroseptal and anterior wall, nl LV diastolic fxn  . Myocardial infarction (HCC) 09/2015  . Status post primary angioplasty with coronary stent   . Tobacco abuse   . Wears dentures    full upper and lower    Patient Active Problem List   Diagnosis Date Noted  . Chronic cough 02/09/2016  . Trochanteric bursitis, right hip 02/09/2016  . Status post insertion of drug-eluting stent into left anterior descending (LAD) artery 10/26/2015  . Anxiety and depression 10/26/2015  .  CAD (coronary artery disease)   . Ischemic cardiomyopathy   . HLD (hyperlipidemia)   . Tobacco abuse   . NSTEMI (non-ST elevated myocardial infarction) (HCC) 10/03/2015    Past Surgical History:  Procedure Laterality Date  . CARDIAC CATHETERIZATION N/A 10/04/2015   Procedure: Left Heart Cath and Coronary Angiography;  Surgeon: Iran Ouch, MD;  Location: [facility] INVASIVE CV LAB;  Service: Cardiovascular;  Laterality: N/A;  . CARDIAC CATHETERIZATION N/A 10/04/2015   Procedure: Coronary Stent Intervention;  Surgeon: Iran Ouch, MD;  Location: [facility] INVASIVE CV LAB;  Service: Cardiovascular;  Laterality: N/A;  . CATARACT EXTRACTION W/PHACO Right 12/18/2018   Procedure: CATARACT EXTRACTION PHACO AND INTRAOCULAR LENS PLACEMENT (IOC)  RIGHT;  Surgeon: Lockie Mola, MD;  Location: [facility];  Service: Ophthalmology;  Laterality: Right;  . CATARACT EXTRACTION W/PHACO Left 01/08/2019   Procedure: CATARACT EXTRACTION PHACO AND INTRAOCULAR LENS PLACEMENT (IOC) LEFT;  Surgeon: Lockie Mola, MD;  Location: [facility];  Service: Ophthalmology;  Laterality: Left;  leave arrival at 10:30  Total Time: 00:43.0 Total Equivalent Power: 14.4% Cumulative Dissipated Energy: 6.23    Prior to Admission medications   Medication Sig Start Date End Date Taking? Authorizing Provider  albuterol (VENTOLIN HFA) 108 (90 Base) MCG/ACT inhaler INHALE 2 PUFFS INTO THE LUNGS EVERY 6 HOURS AS NEEDED FOR WHEEZING OR SHORTNESS OF BREATH. 03/06/19   Salena Saner, MD  aspirin 81 MG  chewable tablet Chew 1 tablet (81 mg total) by mouth daily. 10/05/15   Auburn Bilberry, MD  benzonatate (TESSALON) 100 MG capsule TAKE 2 CAPSULES BY MOUTH EVERY NIGHT AT BEDTIME 11/20/17   Shane Crutch, MD  carvedilol (COREG) 3.125 MG tablet TAKE 1 TABLET BY MOUTH TWICE A DAY 10/03/18   Iran Ouch, MD  hydrOXYzine (ATARAX) 10 MG/5ML syrup Take 5 mLs (10 mg total) by mouth 3 (three) times daily as  needed for itching. 05/01/19   Joni Reining, PA-C  losartan (COZAAR) 50 MG tablet Take 1 tablet (50 mg total) by mouth daily. 03/11/19   Iran Ouch, MD  omeprazole (PRILOSEC) 20 MG capsule TAKE 1 CAPSULE BY MOUTH TWICE DAILY BEFORE MEALS 10/03/18   Iran Ouch, MD  PRALUENT 75 MG/ML SOAJ INJECT 75 MG INTO THE SKIN EVERY 14 DAYSAS DIRECTED 05/01/19   Iran Ouch, MD  rosuvastatin (CRESTOR) 40 MG tablet TAKE 1 TABLET BY MOUTH ONCE A DAY 03/31/19   Iran Ouch, MD  sulfamethoxazole-trimethoprim (BACTRIM DS) 800-160 MG tablet Take 1 tablet by mouth 2 (two) times daily. 05/01/19   Joni Reining, PA-C  SYMBICORT 160-4.5 MCG/ACT inhaler INHALE 1 PUFF INTO THE LUNGS 2 TIMES DAILY. RINSE MOUTH AFTER USE. 03/06/19   Salena Saner, MD  traMADol (ULTRAM) 50 MG tablet Take 1 tablet (50 mg total) by mouth every 12 (twelve) hours as needed for up to 5 days. 05/01/19 05/06/19  Joni Reining, PA-C    Allergies Penicillins  Family History  Problem Relation Age of Onset  . CAD Mother   . Colon cancer Mother     Social History Social History   Tobacco Use  . Smoking status: Current Every Day Smoker    Packs/day: 0.50    Years: 42.00    Pack years: 21.00    Types: Cigarettes  . Smokeless tobacco: Never Used  . Tobacco comment: for 50 years now, currently cut back to 4 or less/day  Substance Use Topics  . Alcohol use: Yes    Alcohol/week: 5.0 standard drinks    Types: 2 Standard drinks or equivalent, 3 Cans of beer per week  . Drug use: No    Review of Systems  Constitutional: Unsure fever/chills Eyes: No visual changes. ENT: No sore throat. Respiratory: Denies cough Cardiovascular: Denies chest pain Gastrointestinal: Denies abdominal pain Genitourinary: Negative for dysuria. Musculoskeletal: Negative for back pain. Skin: Positive for increasing redness swelling to the right axilla and right side Psychiatric: no mood changes,      ____________________________________________   PHYSICAL EXAM:  VITAL SIGNS: ED Triage Vitals  Enc Vitals Group     BP 05/04/19 1506 114/83     Pulse Rate 05/04/19 1506 77     Resp 05/04/19 1506 18     Temp 05/04/19 1506 (!) 97.5 F (36.4 C)     Temp Source 05/04/19 1506 Oral     SpO2 05/04/19 1506 97 %     Weight 05/04/19 1506 187 lb (84.8 kg)     Height 05/04/19 1506 5\' 4"  (1.626 m)     Head Circumference --      Peak Flow --      Pain Score 05/04/19 1505 10     Pain Loc --      Pain Edu? --      Excl. in GC? --     Constitutional: Alert and oriented. Well appearing and in no acute distress. Eyes: Conjunctivae are normal.  Head:  Atraumatic. Nose: No congestion/rhinnorhea. Mouth/Throat: Mucous membranes are moist.   Neck:  supple no lymphadenopathy noted Cardiovascular: Normal rate, regular rhythm. Heart sounds are normal Respiratory: Normal respiratory effort.  No retractions, lungs c t a  Abd: soft nontender bs normal all 4 quad GU: deferred Musculoskeletal: Decreased range of motion of the right arm due to discomfort, large amount of cellulitis noted in the right axilla along with an abscess, cellulitis extends across her right breast to the middle of the chest neurologic:  Normal speech and language.  Skin:  Skin is warm, dry and intact.  Cellulitis as noted above Psychiatric: Mood and affect are normal. Speech and behavior are normal.  ____________________________________________   LABS (all labs ordered are listed, but only abnormal results are displayed)  Labs Reviewed  CBC WITH DIFFERENTIAL/PLATELET - Abnormal; Notable for the following components:      Result Value   WBC 12.9 (*)    Neutro Abs 10.5 (*)    All other components within normal limits  COMPREHENSIVE METABOLIC PANEL - Abnormal; Notable for the following components:   Sodium 133 (*)    Chloride 96 (*)    Glucose, Bld 114 (*)    Creatinine, Ser 1.14 (*)    Albumin 3.4 (*)    GFR calc  non Af Amer 50 (*)    GFR calc Af Amer 58 (*)    All other components within normal limits  RESPIRATORY PANEL BY RT PCR (FLU A&B, COVID)  CULTURE, BLOOD (ROUTINE X 2)  CULTURE, BLOOD (ROUTINE X 2)  LACTIC ACID, PLASMA   ____________________________________________   ____________________________________________  RADIOLOGY  CT of the chest with IV contrast shows cellulitis but no drainable abscess  ____________________________________________   PROCEDURES  Procedure(s) performed: Vancomycin IV , morphine 4 mg IV, Zofran 4 mg IV  Procedures    ____________________________________________   INITIAL IMPRESSION / ASSESSMENT AND PLAN / ED COURSE  Pertinent labs & imaging results that were available during my care of the patient were reviewed by me and considered in my medical decision making (see chart for details).   Patient is a 64 year old female presents emergency department with worsening cellulitis.  Physical exam shows patient to have a severe cellulitis in the right axilla with some fluctuance but the redness extends across the entire right breast.   When reviewing the note from her last visit the area was just a small red area.  Since she has been taking Bactrim for 3 to 4 days, I feel she has failed outpatient therapy and will require admission.  I do have concerns that this is a deeper abscess so we will do a CT of the chest to assess the extent of the infection.  DDx includes: Cellulitis, abscess, sepsis  CBC has elevated WBC of 12.9 with elevated neutrophils of 10.5, comprehensive metabolic panel has decreased sodium of 133, creatinine of 1.14 with decreased GFR's, lactic acid is normal at 1.2, blood cultures are pending  CT of the chest with IV contrast to include the right axilla  Started the patient on vancomycin, gave her morphine 4 mg IV and Zofran 4 mg IV.    ----------------------------------------- 6:35 PM on  05/04/2019 -----------------------------------------  CT of the chest show cellulitis. Consulted hospitalist for admission due to failed outpatient therapy and worsening cellulitis.  Severity of the cellulitis requires admission with IV antibiotics.    ----------------------------------------- 6:41 PM on 05/04/2019 -----------------------------------------   Hospitalist return my call.  He will be admitting her under his care.  [name] was evaluated in Emergency Department on 05/04/2019 for the symptoms described in the history of present illness. She was evaluated in the context of the global COVID-19 pandemic, which necessitated consideration that the patient might be at risk for infection with the SARS-CoV-2 virus that causes COVID-19. Institutional protocols and algorithms that pertain to the evaluation of patients at risk for COVID-19 are in a state of rapid change based on information released by regulatory bodies including the CDC and federal and state organizations. These policies and algorithms were followed during the patient's care in the ED.   As part of my medical decision making, I reviewed the following data within the electronic MEDICAL RECORD NUMBER Nursing notes reviewed and incorporated, Labs reviewed see above, Old chart reviewed, Radiograph reviewed CT does not show an abscess but cellulitis, Discussed with admitting physician hospitalist, Notes from prior ED visits and [place] Controlled Substance Database  ____________________________________________   FINAL CLINICAL IMPRESSION(S) / ED DIAGNOSES  Final diagnoses:  Cellulitis of axillary region  Cellulitis of right breast      NEW MEDICATIONS STARTED DURING THIS VISIT:  New Prescriptions   No medications on file     Note:  This document was prepared using Dragon voice recognition software and may include unintentional dictation errors.    Faythe Ghee, PA-C 05/04/19 1842    Emily Filbert,  MD 05/04/19 Jerene Bears

## 2019-05-04 NOTE — ED Notes (Signed)
Monitor briefly silenced for pt. Messaged pharm about meds that need verifying. Will give once verified.

## 2019-05-04 NOTE — ED Notes (Signed)
PT given phone to talk to family member

## 2019-05-04 NOTE — ED Triage Notes (Signed)
Pt states "I got bit by something and they don't know what it is." states under R arm is swollen. A&O, ambulatory.

## 2019-05-04 NOTE — H&P (Addendum)
TRH H&P    Patient Demographics:    [name], is a 64 y.o. female  MRN: [id number]  DOB - 1955-07-10  Admit Date - 05/04/2019  Referring MD/NP/PA:  Ashok Cordia  Outpatient Primary MD for the patient is Patient, No Pcp Per  Patient coming from:  home  Chief complaint- cellulitis   HPI:    [name]  is a 64 y.o. female, w hyperlipidemia, CAD, Gerd, Copd, apparently c/o redness, ? Insect bite over the right breast this past Tuesday.  Pt was seen in ED, and started on Bactrim.  Pt notes that the rash has not improved. Pt denies fever, chills, cough, cp, palp, sob, n/v, diarrhea, brbpr.   In ED,  T 97.5, P 77 R 18, Bp 114/83 pox 97% on RA Wt 84.8kg CT chest IMPRESSION: 1. There is fat stranding in the right axillary region with mild overlying skin thickening. There is no evidence for well-formed drainable fluid collection. Findings are consistent with a soft tissue infection in the appropriate clinical setting. 2. Coronary artery disease.  Wbc 12.9, Hgb 12.0, Plt 251 Na 133, K 3.9 Bun 22, Creatinine 1.14 Ast 29, Alt 20 Lactic acid 1.2 Blood culture x2 pending  Pt will be admitted for cellulitis refractory to oral abx.      Review of systems:    In addition to the HPI above,  No Fever-chills, No Headache, No changes with Vision or hearing, No problems swallowing food or Liquids, No Chest pain, Cough or Shortness of Breath, No Abdominal pain, No Nausea or Vomiting, bowel movements are regular, No Blood in stool or Urine, No dysuria,   No new joints pains-aches,  No new weakness, tingling, numbness in any extremity, No recent weight gain or loss, No polyuria, polydypsia or polyphagia, No significant Mental Stressors.  All other systems reviewed and are negative.    Past History of the following :    Past Medical History:  Diagnosis Date  . CAD (coronary artery  disease)    a. NSTEMI 10/03/15; b. cardiac cath 10/04/15: LM nl, ost-pLAD 90% s/p PCI/DES, mLAD 80% s/p PCI/DES, mRCA 30%, D1,2,3 nl, p-mLCX 30%, OM1 &2 min irregs, OM3 normal, EF 30-35%, sev HK of mid-distal ant, apical, inf wall, mod elevated LVEDP  . Chronic bronchitis ([place])   . Chronic cough   . COPD (chronic obstructive pulmonary disease) ([place])   . Dysrhythmia   . GERD (gastroesophageal reflux disease)   . Heart murmur   . HLD (hyperlipidemia)   . Ischemic cardiomyopathy    a. echo 10/05/15: EF 50%, mild HK of anteroseptal and anterior wall, nl LV diastolic fxn  . Myocardial infarction ([place]) 09/2015  . Status post primary angioplasty with coronary stent   . Tobacco abuse   . Wears dentures    full upper and lower      Past Surgical History:  Procedure Laterality Date  . CARDIAC CATHETERIZATION N/A 10/04/2015   Procedure: Left Heart Cath and Coronary Angiography;  Surgeon: Wellington Hampshire, MD;  Location: [facility];  Service: Cardiovascular;  Laterality: N/A;  . CARDIAC CATHETERIZATION N/A 10/04/2015   Procedure: Coronary Stent Intervention;  Surgeon: Iran Ouch, MD;  Location: [facility];  Service: Cardiovascular;  Laterality: N/A;  . CATARACT EXTRACTION W/PHACO Right 12/18/2018   Procedure: CATARACT EXTRACTION PHACO AND INTRAOCULAR LENS PLACEMENT (IOC)  RIGHT;  Surgeon: Lockie Mola, MD;  Location: [facility];  Service: Ophthalmology;  Laterality: Right;  . CATARACT EXTRACTION W/PHACO Left 01/08/2019   Procedure: CATARACT EXTRACTION PHACO AND INTRAOCULAR LENS PLACEMENT (IOC) LEFT;  Surgeon: Lockie Mola, MD;  Location: [facility];  Service: Ophthalmology;  Laterality: Left;  leave arrival at 10:30  Total Time: 00:43.0 Total Equivalent Power: 14.4% Cumulative Dissipated Energy: 6.23      Social History:      Social History   Tobacco Use  . Smoking status: Current Every Day Smoker    Packs/day: 0.50    Years: 42.00     Pack years: 21.00    Types: Cigarettes  . Smokeless tobacco: Never Used  . Tobacco comment: for 50 years now, currently cut back to 4 or less/day  Substance Use Topics  . Alcohol use: Yes    Alcohol/week: 5.0 standard drinks    Types: 2 Standard drinks or equivalent, 3 Cans of beer per week       Family History :     Family History  Problem Relation Age of Onset  . CAD Mother   . Colon cancer Mother        Home Medications:   Prior to Admission medications   Medication Sig Start Date End Date Taking? Authorizing Provider  albuterol (VENTOLIN HFA) 108 (90 Base) MCG/ACT inhaler INHALE 2 PUFFS INTO THE LUNGS EVERY 6 HOURS AS NEEDED FOR WHEEZING OR SHORTNESS OF BREATH. 03/06/19   Salena Saner, MD  aspirin 81 MG chewable tablet Chew 1 tablet (81 mg total) by mouth daily. 10/05/15   Auburn Bilberry, MD  benzonatate (TESSALON) 100 MG capsule TAKE 2 CAPSULES BY MOUTH EVERY NIGHT AT BEDTIME 11/20/17   Shane Crutch, MD  carvedilol (COREG) 3.125 MG tablet TAKE 1 TABLET BY MOUTH TWICE A DAY 10/03/18   Iran Ouch, MD  hydrOXYzine (ATARAX) 10 MG/5ML syrup Take 5 mLs (10 mg total) by mouth 3 (three) times daily as needed for itching. 05/01/19   Joni Reining, PA-C  losartan (COZAAR) 50 MG tablet Take 1 tablet (50 mg total) by mouth daily. 03/11/19   Iran Ouch, MD  omeprazole (PRILOSEC) 20 MG capsule TAKE 1 CAPSULE BY MOUTH TWICE DAILY BEFORE MEALS 10/03/18   Iran Ouch, MD  PRALUENT 75 MG/ML SOAJ INJECT 75 MG INTO THE SKIN EVERY 14 DAYSAS DIRECTED 05/01/19   Iran Ouch, MD  rosuvastatin (CRESTOR) 40 MG tablet TAKE 1 TABLET BY MOUTH ONCE A DAY 03/31/19   Iran Ouch, MD  sulfamethoxazole-trimethoprim (BACTRIM DS) 800-160 MG tablet Take 1 tablet by mouth 2 (two) times daily. 05/01/19   Joni Reining, PA-C  SYMBICORT 160-4.5 MCG/ACT inhaler INHALE 1 PUFF INTO THE LUNGS 2 TIMES DAILY. RINSE MOUTH AFTER USE. 03/06/19   Salena Saner, MD  traMADol  (ULTRAM) 50 MG tablet Take 1 tablet (50 mg total) by mouth every 12 (twelve) hours as needed for up to 5 days. 05/01/19 05/06/19  Joni Reining, PA-C     Allergies:     Allergies  Allergen Reactions  . Penicillins Rash    Has patient had a PCN reaction causing  immediate rash, facial/tongue/throat swelling, SOB or lightheadedness with hypotension: Yes Has patient had a PCN reaction causing severe rash involving mucus membranes or skin necrosis: No Has patient had a PCN reaction that required hospitalization No Has patient had a PCN reaction occurring within the last 10 years: No If all of the above answers are "NO", then may proceed with Cephalosporin use.     Physical Exam:   Vitals  Blood pressure 114/83, pulse 77, temperature (!) 97.5 F (36.4 C), temperature source Oral, resp. rate 18, height  (1.626 m), weight 84.8 kg, SpO2 97 %.  1.  General: axoxo3  2. Psychiatric: euthymic  3. Neurologic: cn2-12 intact, reflexes 2+ symmetric, diffuse with no clonus, motor 5/5 in all 4 ext  4. HEENMT:  Anicteric, pupils 1.31mm symmetric, direct, consensual intact Neck: no jvd  5. Respiratory : CTAB  6. Cardiovascular : rrr s1, s2,   7. Gastrointestinal:  Abd: soft, obese, nt, nd, +bs  8. Skin:  Ext: no c/c/e,  Redness extending from the right axillary area to the right breast.  + warmth. (cellulitic)  9.Musculoskeletal:  Good ROM    Data Review:    CBC Recent Labs  Lab 05/04/19 1558  WBC 12.9*  HGB 12.0  HCT 37.5  PLT 251  MCV 90.1  MCH 28.8  MCHC 32.0  RDW 13.4  LYMPHSABS 1.2  MONOABS 0.7  EOSABS 0.3  BASOSABS 0.1   ------------------------------------------------------------------------------------------------------------------  Results for orders placed or performed during the hospital encounter of 05/04/19 (from the past 48 hour(s))  CBC with Differential     Status: Abnormal   Collection Time: 05/04/19  3:58 PM  Result Value Ref Range   WBC  12.9 (H) 4.0 - 10.5 K/uL   RBC 4.16 3.87 - 5.11 MIL/uL   Hemoglobin 12.0 12.0 - 15.0 g/dL   HCT 16.1 09.6 - 04.5 %   MCV 90.1 80.0 - 100.0 fL   MCH 28.8 26.0 - 34.0 pg   MCHC 32.0 30.0 - 36.0 g/dL   RDW 40.9 81.1 - 91.4 %   Platelets 251 150 - 400 K/uL   nRBC 0.0 0.0 - 0.2 %   Neutrophils Relative % 82 %   Neutro Abs 10.5 (H) 1.7 - 7.7 K/uL   Lymphocytes Relative 9 %   Lymphs Abs 1.2 0.7 - 4.0 K/uL   Monocytes Relative 6 %   Monocytes Absolute 0.7 0.1 - 1.0 K/uL   Eosinophils Relative 3 %   Eosinophils Absolute 0.3 0.0 - 0.5 K/uL   Basophils Relative 0 %   Basophils Absolute 0.1 0.0 - 0.1 K/uL   Immature Granulocytes 0 %   Abs Immature Granulocytes 0.05 0.00 - 0.07 K/uL    Comment: Performed at [facility], [address] [place], [place] [address]  Comprehensive metabolic panel     Status: Abnormal   Collection Time: 05/04/19  3:58 PM  Result Value Ref Range   Sodium 133 (L) 135 - 145 mmol/L   Potassium 3.9 3.5 - 5.1 mmol/L   Chloride 96 (L) 98 - 111 mmol/L   CO2 25 22 - 32 mmol/L   Glucose, Bld 114 (H) 70 - 99 mg/dL   BUN 22 8 - 23 mg/dL   Creatinine, Ser 6.21 (H) 0.44 - 1.00 mg/dL   Calcium 9.2 8.9 - 30.8 mg/dL   Total Protein 8.0 6.5 - 8.1 g/dL   Albumin 3.4 (L) 3.5 - 5.0 g/dL   AST 29 15 - 41 U/L  ALT 20 0 - 44 U/L   Alkaline Phosphatase 83 38 - 126 U/L   Total Bilirubin 0.6 0.3 - 1.2 mg/dL   GFR calc non Af Amer 50 (L) >60 mL/min   GFR calc Af Amer 58 (L) >60 mL/min   Anion gap 12 5 - 15    Comment: Performed at [facility], [address] [place], [place] [address]  Lactic acid, plasma     Status: None   Collection Time: 05/04/19  3:58 PM  Result Value Ref Range   Lactic Acid, Venous 1.2 0.5 - 1.9 mmol/L    Comment: Performed at [facility], [address] [place], [place] [address]  Respiratory Panel by RT PCR (Flu A&B, Covid) - Nasopharyngeal Swab     Status: None   Collection Time: 05/04/19  4:31 PM   Specimen:  Nasopharyngeal Swab  Result Value Ref Range   SARS Coronavirus 2 by RT PCR NEGATIVE NEGATIVE    Comment: (NOTE) SARS-CoV-2 target nucleic acids are NOT DETECTED. The SARS-CoV-2 RNA is generally detectable in upper respiratoy specimens during the acute phase of infection. The lowest concentration of SARS-CoV-2 viral copies this assay can detect is 131 copies/mL. A negative result does not preclude SARS-Cov-2 infection and should not be used as the sole basis for treatment or other patient management decisions. A negative result may occur with  improper specimen collection/handling, submission of specimen other than nasopharyngeal swab, presence of viral mutation(s) within the areas targeted by this assay, and inadequate number of viral copies (<131 copies/mL). A negative result must be combined with clinical observations, patient history, and epidemiological information. The expected result is Negative. Fact Sheet for Patients:  https://www.moore.com/ Fact Sheet for Healthcare Providers:  https://www.young.biz/ This test is not yet ap proved or cleared by the [place] FDA and  has been authorized for detection and/or diagnosis of SARS-CoV-2 by FDA under an Emergency Use Authorization (EUA). This EUA will remain  in effect (meaning this test can be used) for the duration of the COVID-19 declaration under Section 564(b)(1) of the Act, 21 U.S.C. section 360bbb-3(b)(1), unless the authorization is terminated or revoked sooner.    Influenza A by PCR NEGATIVE NEGATIVE   Influenza B by PCR NEGATIVE NEGATIVE    Comment: (NOTE) The Xpert Xpress SARS-CoV-2/FLU/RSV assay is intended as an aid in  the diagnosis of influenza from Nasopharyngeal swab specimens and  should not be used as a sole basis for treatment. Nasal washings and  aspirates are unacceptable for Xpert Xpress SARS-CoV-2/FLU/RSV  testing. Fact Sheet for  Patients: https://www.moore.com/ Fact Sheet for Healthcare Providers: https://www.young.biz/ This test is not yet approved or cleared by the [place] FDA and  has been authorized for detection and/or diagnosis of SARS-CoV-2 by  FDA under an Emergency Use Authorization (EUA). This EUA will remain  in effect (meaning this test can be used) for the duration of the  Covid-19 declaration under Section 564(b)(1) of the Act, 21  U.S.C. section 360bbb-3(b)(1), unless the authorization is  terminated or revoked. Performed at [facility], [address] [place], [place] [address]     Chemistries  Recent Labs  Lab 05/04/19 1558  NA 133*  K 3.9  CL 96*  CO2 25  GLUCOSE 114*  BUN 22  CREATININE 1.14*  CALCIUM 9.2  AST 29  ALT 20  ALKPHOS 83  BILITOT 0.6   ------------------------------------------------------------------------------------------------------------------  ------------------------------------------------------------------------------------------------------------------ GFR: Estimated Creatinine Clearance: 51.8 mL/min (A) (by C-G formula based on SCr  of 1.14 mg/dL (H)). Liver Function Tests: Recent Labs  Lab 05/04/19 1558  AST 29  ALT 20  ALKPHOS 83  BILITOT 0.6  PROT 8.0  ALBUMIN 3.4*   No results for input(s): LIPASE, AMYLASE in the last 168 hours. No results for input(s): AMMONIA in the last 168 hours. Coagulation Profile: No results for input(s): INR, PROTIME in the last 168 hours. Cardiac Enzymes: No results for input(s): CKTOTAL, CKMB, CKMBINDEX, TROPONINI in the last 168 hours. BNP (last 3 results) No results for input(s): PROBNP in the last 8760 hours. HbA1C: No results for input(s): HGBA1C in the last 72 hours. CBG: No results for input(s): GLUCAP in the last 168 hours. Lipid Profile: No results for input(s): CHOL, HDL, LDLCALC, TRIG, CHOLHDL, LDLDIRECT in the last 72 hours. Thyroid Function  Tests: No results for input(s): TSH, T4TOTAL, FREET4, T3FREE, THYROIDAB in the last 72 hours. Anemia Panel: No results for input(s): VITAMINB12, FOLATE, FERRITIN, TIBC, IRON, RETICCTPCT in the last 72 hours.  --------------------------------------------------------------------------------------------------------------- Urine analysis: No results found for: COLORURINE, APPEARANCEUR, LABSPEC, PHURINE, GLUCOSEU, HGBUR, BILIRUBINUR, KETONESUR, PROTEINUR, UROBILINOGEN, NITRITE, LEUKOCYTESUR    Imaging Results:    CT Chest W Contrast  Result Date: 05/04/2019 CLINICAL DATA:  Cellulitis of the axilla with concern for abscess. EXAM: CT CHEST WITH CONTRAST TECHNIQUE: Multidetector CT imaging of the chest was performed during intravenous contrast administration. CONTRAST:  54mL OMNIPAQUE IOHEXOL 300 MG/ML  SOLN COMPARISON:  None. FINDINGS: Cardiovascular: Aortic calcifications are noted without evidence for dissection or aneurysm. The heart size is normal. Coronary artery calcifications are noted. There is no large centrally located pulmonary embolism. Mediastinum/Nodes: --No mediastinal or hilar lymphadenopathy. --there is mild right axillary adenopathy, presumably reactive. --No supraclavicular lymphadenopathy. --Normal thyroid gland. --the esophagus is fluid-filled to the level of the mid thorax. Lungs/Pleura: No pulmonary nodules or masses. No pleural effusion or pneumothorax. No focal airspace consolidation. No focal pleural abnormality. Upper Abdomen: No acute abnormality. Musculoskeletal: There is fat stranding in the right axillary region with mild overlying skin thickening. There is no evidence for well-formed drainable fluid collection. IMPRESSION: 1. There is fat stranding in the right axillary region with mild overlying skin thickening. There is no evidence for well-formed drainable fluid collection. Findings are consistent with a soft tissue infection in the appropriate clinical setting. 2. Coronary  artery disease. Aortic Atherosclerosis (ICD10-I70.0). Electronically Signed   By: Katherine Mantle M.D.   On: 05/04/2019 18:16       Assessment & Plan:    Principal Problem:   Cellulitis Active Problems:   CAD (coronary artery disease)   HLD (hyperlipidemia)   Hyponatremia   Moderate protein-calorie malnutrition (HCC)    Cellulitis Blood culture x2 vanco iv, Levaquin iv pharmacy to dose Check cbc in am  Hyponatremia Hydrate with ns iv Check cmp in am  Moderate Protein Calorie Malnutrition prostat 30 mL po bid  CAD Cont Aspirin 81mg  po qday Cont Praluent (unclear when her last dose was, please clarify in AM) Cont carvedilol 3.125mg  po bid Cont Losartan 50mg  po qday Cont Crestor 40mg  po qday  Copd Cont Symbicort 2puff bid Cont Albuterol HFA 2puff q6h prn   Gerd Cont PPI  OA Cont Tramadol prn      DVT Prophylaxis-   Lovenox - SCDs   AM Labs Ordered, also please review Full Orders  Family Communication: Admission, patients condition and plan of care including tests being ordered have been discussed with the patient  who indicate understanding and agree with the plan and  Code Status.  Code Status:  FULL CODE per patient,  Notified son that patient will be admitted to [facility]  Admission status: Observation: Based on patients clinical presentation and evaluation of above clinical data, I have made determination that patient meets Observation criteria at this time.  Pt has failed oral abx, and will require hospitalization and iv abx,  Depending upon response to iv abx, may require change to inpatient stay.   Time spent in minutes : 55 minutes   Pearson Grippe M.D on 05/04/2019 at 6:53 PM

## 2019-05-05 DIAGNOSIS — Z955 Presence of coronary angioplasty implant and graft: Secondary | ICD-10-CM

## 2019-05-05 DIAGNOSIS — Z79899 Other long term (current) drug therapy: Secondary | ICD-10-CM

## 2019-05-05 DIAGNOSIS — Z880 Allergy status to penicillin: Secondary | ICD-10-CM

## 2019-05-05 DIAGNOSIS — E871 Hypo-osmolality and hyponatremia: Secondary | ICD-10-CM | POA: Diagnosis not present

## 2019-05-05 DIAGNOSIS — L089 Local infection of the skin and subcutaneous tissue, unspecified: Secondary | ICD-10-CM | POA: Diagnosis not present

## 2019-05-05 DIAGNOSIS — N61 Mastitis without abscess: Secondary | ICD-10-CM | POA: Diagnosis not present

## 2019-05-05 DIAGNOSIS — E785 Hyperlipidemia, unspecified: Secondary | ICD-10-CM

## 2019-05-05 DIAGNOSIS — F17210 Nicotine dependence, cigarettes, uncomplicated: Secondary | ICD-10-CM

## 2019-05-05 DIAGNOSIS — I428 Other cardiomyopathies: Secondary | ICD-10-CM | POA: Diagnosis not present

## 2019-05-05 DIAGNOSIS — I2510 Atherosclerotic heart disease of native coronary artery without angina pectoris: Secondary | ICD-10-CM

## 2019-05-05 DIAGNOSIS — Z7982 Long term (current) use of aspirin: Secondary | ICD-10-CM

## 2019-05-05 LAB — COMPREHENSIVE METABOLIC PANEL
ALT: 19 U/L (ref 0–44)
AST: 23 U/L (ref 15–41)
Albumin: 2.9 g/dL — ABNORMAL LOW (ref 3.5–5.0)
Alkaline Phosphatase: 72 U/L (ref 38–126)
Anion gap: 11 (ref 5–15)
BUN: 22 mg/dL (ref 8–23)
CO2: 23 mmol/L (ref 22–32)
Calcium: 8.8 mg/dL — ABNORMAL LOW (ref 8.9–10.3)
Chloride: 96 mmol/L — ABNORMAL LOW (ref 98–111)
Creatinine, Ser: 1.01 mg/dL — ABNORMAL HIGH (ref 0.44–1.00)
GFR calc Af Amer: >60 mL/min (ref >60)
GFR calc non Af Amer: 58 mL/min — ABNORMAL LOW (ref >60)
Glucose, Bld: 117 mg/dL — ABNORMAL HIGH (ref 70–99)
Potassium: 3.7 mmol/L (ref 3.5–5.1)
Sodium: 130 mmol/L — ABNORMAL LOW (ref 135–145)
Total Bilirubin: 0.4 mg/dL (ref 0.3–1.2)
Total Protein: 7 g/dL (ref 6.5–8.1)

## 2019-05-05 LAB — CBC
HCT: 32 % — ABNORMAL LOW (ref 36.0–46.0)
Hemoglobin: 11.1 g/dL — ABNORMAL LOW (ref 12.0–15.0)
MCH: 29.4 pg (ref 26.0–34.0)
MCHC: 34.7 g/dL (ref 30.0–36.0)
MCV: 84.9 fL (ref 80.0–100.0)
Platelets: 236 10*3/uL (ref 150–400)
RBC: 3.77 MIL/uL — ABNORMAL LOW (ref 3.87–5.11)
RDW: 13.6 % (ref 11.5–15.5)
WBC: 11 10*3/uL — ABNORMAL HIGH (ref 4.0–10.5)
nRBC: 0 % (ref 0.0–0.2)

## 2019-05-05 LAB — HIV ANTIBODY (ROUTINE TESTING W REFLEX): HIV Screen 4th Generation wRfx: NONREACTIVE

## 2019-05-05 MED ORDER — LEVOFLOXACIN IN D5W 750 MG/150ML IV SOLN
750.0000 mg | INTRAVENOUS | Status: DC
  Administered 2019-05-05: 750 mg via INTRAVENOUS
  Filled 2019-05-05 (×2): qty 150

## 2019-05-05 MED ORDER — CEFAZOLIN SODIUM-DEXTROSE 1-4 GM/50ML-% IV SOLN
1.0000 g | Freq: Three times a day (TID) | INTRAVENOUS | Status: DC
  Administered 2019-05-05 – 2019-05-07 (×6): 1 g via INTRAVENOUS
  Filled 2019-05-05 (×9): qty 50

## 2019-05-05 MED ORDER — VANCOMYCIN HCL 750 MG/150ML IV SOLN
750.0000 mg | INTRAVENOUS | Status: DC
  Administered 2019-05-05: 750 mg via INTRAVENOUS
  Filled 2019-05-05 (×3): qty 150

## 2019-05-05 MED ORDER — PNEUMOCOCCAL VAC POLYVALENT 25 MCG/0.5ML IJ INJ
0.5000 mL | INJECTION | INTRAMUSCULAR | Status: AC
  Administered 2019-05-08: 0.5 mL via INTRAMUSCULAR
  Filled 2019-05-05: qty 0.5

## 2019-05-05 MED ORDER — SODIUM CHLORIDE 0.9 % IV SOLN: INTRAVENOUS | Status: DC

## 2019-05-05 NOTE — ED Notes (Signed)
Adjusted pt's BP cuff at this time. Pt is in NAD.

## 2019-05-05 NOTE — Progress Notes (Signed)
Patient admitted to room 157. Patient alert and oriented. Oriented to room. Call bell within reach

## 2019-05-05 NOTE — ED Notes (Signed)
Attempted to call report. 2C charge nurse stated that they did not have staffing to support taking another patient and bed placement would be removing the assignment.

## 2019-05-05 NOTE — ED Notes (Addendum)
Pt assisted up to bedside toilet. Cellulitis at R side of chest hot, swollen and pink/red. Pt urinating.

## 2019-05-05 NOTE — Progress Notes (Signed)
Pharmacy Antibiotic Note  [name] is a 64 y.o. female admitted on 05/04/2019 with cellulitis.  Pharmacy has been consulted for vanc/levaquin dosing.  Plan: Will start levaquin 750 mg IV q24h per CrCl > 50 ml/min.  Vancomycin 750 mg IV Q 24 hrs. Goal AUC 400-550. Expected AUC: 445.7 SCr used: 1.14 Cssmin: 11.6  Will continue to monitor and adjust as needed.  Height: 5\' 4"  (162.6 cm) Weight: 187 lb (84.8 kg) IBW/kg (Calculated) : 54.7  Temp (24hrs), Avg:97.5 F (36.4 C), Min:97.5 F (36.4 C), Max:97.5 F (36.4 C)  Recent Labs  Lab 05/04/19 1558  WBC 12.9*  CREATININE 1.14*  LATICACIDVEN 1.2    Estimated Creatinine Clearance: 51.8 mL/min (A) (by C-G formula based on SCr of 1.14 mg/dL (H)).    Allergies  Allergen Reactions  . Penicillins Rash    Has patient had a PCN reaction causing immediate rash, facial/tongue/throat swelling, SOB or lightheadedness with hypotension: Yes Has patient had a PCN reaction causing severe rash involving mucus membranes or skin necrosis: No Has patient had a PCN reaction that required hospitalization No Has patient had a PCN reaction occurring within the last 10 years: No If all of the above answers are "NO", then may proceed with Cephalosporin use.    Thank you for allowing pharmacy to be a part of this patient's care.  07/02/19, PharmD, BCPS Clinical Pharmacist 05/05/2019 2:04 AM

## 2019-05-05 NOTE — ED Notes (Signed)
ED TO INPATIENT HANDOFF REPORT  ED Nurse Name and Phone #:  Gershon Mussel RN   [phone]  S Name/Age/Gender [name] 64 y.o. female Room/Bed: ED33A/ED33A  Code Status   Code Status: Full Code  Home/SNF/Other Home Patient oriented to: self, place, time and situation Is this baseline? Yes   Triage Complete: Triage complete  Chief Complaint Cellulitis [X52.84]  Triage Note Pt states "I got bit by something and they don't know what it is." states under R arm is swollen. A&O, ambulatory.     Allergies Allergies  Allergen Reactions  . Penicillins Rash    Has patient had a PCN reaction causing immediate rash, facial/tongue/throat swelling, SOB or lightheadedness with hypotension: Yes Has patient had a PCN reaction causing severe rash involving mucus membranes or skin necrosis: No Has patient had a PCN reaction that required hospitalization No Has patient had a PCN reaction occurring within the last 10 years: No If all of the above answers are "NO", then may proceed with Cephalosporin use.    Level of Care/Admitting Diagnosis ED Disposition    ED Disposition Condition [facility] Area: [place] [[id number]]  Level of Care: Med-Surg [16]  Covid Evaluation: Asymptomatic Screening Protocol (No Symptoms)  Diagnosis: Cellulitis [132440]  Admitting Physician: Jani Gravel [[id number]]  Attending Physician: Jani Gravel [[id number]]  PT Class (Do Not Modify): Observation [104]  PT Acc Code (Do Not Modify): Observation [10022]       B Medical/Surgery History Past Medical History:  Diagnosis Date  . CAD (coronary artery disease)    a. NSTEMI 10/03/15; b. cardiac cath 10/04/15: LM nl, ost-pLAD 90% s/p PCI/DES, mLAD 80% s/p PCI/DES, mRCA 30%, D1,2,3 nl, p-mLCX 30%, OM1 &2 min irregs, OM3 normal, EF 30-35%, sev HK of mid-distal ant, apical, inf wall, mod elevated LVEDP  . Chronic bronchitis ([place])   . Chronic cough   . COPD (chronic obstructive pulmonary disease)  ([place])   . Dysrhythmia   . GERD (gastroesophageal reflux disease)   . Heart murmur   . HLD (hyperlipidemia)   . Ischemic cardiomyopathy    a. echo 10/05/15: EF 50%, mild HK of anteroseptal and anterior wall, nl LV diastolic fxn  . Myocardial infarction ([place]) 09/2015  . Status post primary angioplasty with coronary stent   . Tobacco abuse   . Wears dentures    full upper and lower   Past Surgical History:  Procedure Laterality Date  . CARDIAC CATHETERIZATION N/A 10/04/2015   Procedure: Left Heart Cath and Coronary Angiography;  Surgeon: Wellington Hampshire, MD;  Location: [facility];  Service: Cardiovascular;  Laterality: N/A;  . CARDIAC CATHETERIZATION N/A 10/04/2015   Procedure: Coronary Stent Intervention;  Surgeon: Wellington Hampshire, MD;  Location: [facility];  Service: Cardiovascular;  Laterality: N/A;  . CATARACT EXTRACTION W/PHACO Right 12/18/2018   Procedure: CATARACT EXTRACTION PHACO AND INTRAOCULAR LENS PLACEMENT ([place])  RIGHT;  Surgeon: Leandrew Koyanagi, MD;  Location: [facility];  Service: Ophthalmology;  Laterality: Right;  . CATARACT EXTRACTION W/PHACO Left 01/08/2019   Procedure: CATARACT EXTRACTION PHACO AND INTRAOCULAR LENS PLACEMENT ([place]) LEFT;  Surgeon: Leandrew Koyanagi, MD;  Location: [facility];  Service: Ophthalmology;  Laterality: Left;  leave arrival at 10:30  Total Time: 00:43.0 Total Equivalent Power: 14.4% Cumulative Dissipated Energy: 6.23     A IV Location/Drains/Wounds Patient Lines/Drains/Airways Status   Active Line/Drains/Airways    Name:   Placement date:   Placement time:   Site:  Days:   Peripheral IV 05/04/19 Left Antecubital   05/04/19    1606    Antecubital   1   Incision (Closed) 12/18/18 Eye Right   12/18/18    1100     138   Incision (Closed) 01/08/19 Eye Other (Comment)   01/08/19    1240     117          Intake/Output Last 24 hours  Intake/Output Summary (Last 24 hours) at 05/05/2019 0328 Last data  filed at 05/05/2019 0213 Gross per 24 hour  Intake 596 ml  Output 300 ml  Net 296 ml    Labs/Imaging Results for orders placed or performed during the hospital encounter of 05/04/19 (from the past 48 hour(s))  CBC with Differential     Status: Abnormal   Collection Time: 05/04/19  3:58 PM  Result Value Ref Range   WBC 12.9 (H) 4.0 - 10.5 K/uL   RBC 4.16 3.87 - 5.11 MIL/uL   Hemoglobin 12.0 12.0 - 15.0 g/dL   HCT 14.4 81.8 - 56.3 %   MCV 90.1 80.0 - 100.0 fL   MCH 28.8 26.0 - 34.0 pg   MCHC 32.0 30.0 - 36.0 g/dL   RDW 14.9 70.2 - 63.7 %   Platelets 251 150 - 400 K/uL   nRBC 0.0 0.0 - 0.2 %   Neutrophils Relative % 82 %   Neutro Abs 10.5 (H) 1.7 - 7.7 K/uL   Lymphocytes Relative 9 %   Lymphs Abs 1.2 0.7 - 4.0 K/uL   Monocytes Relative 6 %   Monocytes Absolute 0.7 0.1 - 1.0 K/uL   Eosinophils Relative 3 %   Eosinophils Absolute 0.3 0.0 - 0.5 K/uL   Basophils Relative 0 %   Basophils Absolute 0.1 0.0 - 0.1 K/uL   Immature Granulocytes 0 %   Abs Immature Granulocytes 0.05 0.00 - 0.07 K/uL    Comment: Performed at [facility], [address] [place], [place] [address]  Comprehensive metabolic panel     Status: Abnormal   Collection Time: 05/04/19  3:58 PM  Result Value Ref Range   Sodium 133 (L) 135 - 145 mmol/L   Potassium 3.9 3.5 - 5.1 mmol/L   Chloride 96 (L) 98 - 111 mmol/L   CO2 25 22 - 32 mmol/L   Glucose, Bld 114 (H) 70 - 99 mg/dL   BUN 22 8 - 23 mg/dL   Creatinine, Ser 0.27 (H) 0.44 - 1.00 mg/dL   Calcium 9.2 8.9 - 74.1 mg/dL   Total Protein 8.0 6.5 - 8.1 g/dL   Albumin 3.4 (L) 3.5 - 5.0 g/dL   AST 29 15 - 41 U/L   ALT 20 0 - 44 U/L   Alkaline Phosphatase 83 38 - 126 U/L   Total Bilirubin 0.6 0.3 - 1.2 mg/dL   GFR calc non Af Amer 50 (L) >60 mL/min   GFR calc Af Amer 58 (L) >60 mL/min   Anion gap 12 5 - 15    Comment: Performed at [facility], [address] [place], [place] [address]  Lactic acid, plasma     Status: None   Collection  Time: 05/04/19  3:58 PM  Result Value Ref Range   Lactic Acid, Venous 1.2 0.5 - 1.9 mmol/L    Comment: Performed at [facility], [address] [place], [place] [address]  Respiratory Panel by RT PCR (Flu A&B, Covid) - Nasopharyngeal Swab     Status: None  Collection Time: 05/04/19  4:31 PM   Specimen: Nasopharyngeal Swab  Result Value Ref Range   SARS Coronavirus 2 by RT PCR NEGATIVE NEGATIVE    Comment: (NOTE) SARS-CoV-2 target nucleic acids are NOT DETECTED. The SARS-CoV-2 RNA is generally detectable in upper respiratoy specimens during the acute phase of infection. The lowest concentration of SARS-CoV-2 viral copies this assay can detect is 131 copies/mL. A negative result does not preclude SARS-Cov-2 infection and should not be used as the sole basis for treatment or other patient management decisions. A negative result may occur with  improper specimen collection/handling, submission of specimen other than nasopharyngeal swab, presence of viral mutation(s) within the areas targeted by this assay, and inadequate number of viral copies (<131 copies/mL). A negative result must be combined with clinical observations, patient history, and epidemiological information. The expected result is Negative. Fact Sheet for Patients:  https://www.moore.com/ Fact Sheet for Healthcare Providers:  https://www.young.biz/ This test is not yet ap proved or cleared by the [place] FDA and  has been authorized for detection and/or diagnosis of SARS-CoV-2 by FDA under an Emergency Use Authorization (EUA). This EUA will remain  in effect (meaning this test can be used) for the duration of the COVID-19 declaration under Section 564(b)(1) of the Act, 21 U.S.C. section 360bbb-3(b)(1), unless the authorization is terminated or revoked sooner.    Influenza A by PCR NEGATIVE NEGATIVE   Influenza B by PCR NEGATIVE NEGATIVE    Comment: (NOTE) The  Xpert Xpress SARS-CoV-2/FLU/RSV assay is intended as an aid in  the diagnosis of influenza from Nasopharyngeal swab specimens and  should not be used as a sole basis for treatment. Nasal washings and  aspirates are unacceptable for Xpert Xpress SARS-CoV-2/FLU/RSV  testing. Fact Sheet for Patients: https://www.moore.com/ Fact Sheet for Healthcare Providers: https://www.young.biz/ This test is not yet approved or cleared by the [place] FDA and  has been authorized for detection and/or diagnosis of SARS-CoV-2 by  FDA under an Emergency Use Authorization (EUA). This EUA will remain  in effect (meaning this test can be used) for the duration of the  Covid-19 declaration under Section 564(b)(1) of the Act, 21  U.S.C. section 360bbb-3(b)(1), unless the authorization is  terminated or revoked. Performed at [facility], [address], [place], [place] [address]    CT Chest W Contrast  Result Date: 05/04/2019 CLINICAL DATA:  Cellulitis of the axilla with concern for abscess. EXAM: CT CHEST WITH CONTRAST TECHNIQUE: Multidetector CT imaging of the chest was performed during intravenous contrast administration. CONTRAST:  80mL OMNIPAQUE IOHEXOL 300 MG/ML  SOLN COMPARISON:  None. FINDINGS: Cardiovascular: Aortic calcifications are noted without evidence for dissection or aneurysm. The heart size is normal. Coronary artery calcifications are noted. There is no large centrally located pulmonary embolism. Mediastinum/Nodes: --No mediastinal or hilar lymphadenopathy. --there is mild right axillary adenopathy, presumably reactive. --No supraclavicular lymphadenopathy. --Normal thyroid gland. --the esophagus is fluid-filled to the level of the mid thorax. Lungs/Pleura: No pulmonary nodules or masses. No pleural effusion or pneumothorax. No focal airspace consolidation. No focal pleural abnormality. Upper Abdomen: No acute abnormality. Musculoskeletal: There is  fat stranding in the right axillary region with mild overlying skin thickening. There is no evidence for well-formed drainable fluid collection. IMPRESSION: 1. There is fat stranding in the right axillary region with mild overlying skin thickening. There is no evidence for well-formed drainable fluid collection. Findings are consistent with a soft tissue infection in the appropriate clinical setting. 2. Coronary artery disease. Aortic  Atherosclerosis (ICD10-I70.0). Electronically Signed   By: Katherine Mantle M.D.   On: 05/04/2019 18:16    Pending Labs Unresulted Labs (From admission, onward)    Start     Ordered   05/11/19 0500  Creatinine, serum  (enoxaparin (LOVENOX)    CrCl >/= 30 ml/min)  Weekly,   STAT    Comments: while on enoxaparin therapy    05/04/19 1849   05/05/19 0500  HIV Antibody (routine testing w rflx)  (HIV Antibody (Routine testing w reflex) panel)  Tomorrow morning,   STAT    Question:  Release to patient  Answer:  Immediate   05/04/19 1849   05/05/19 0500  Comprehensive metabolic panel  Tomorrow morning,   STAT     05/04/19 1849   05/05/19 0500  CBC  Tomorrow morning,   STAT     05/04/19 1849   05/04/19 1549  Culture, blood (routine x 2)  BLOOD CULTURE X 2,   STAT     05/04/19 1548          Vitals/Pain Today's Vitals   05/05/19 0005 05/05/19 0009 05/05/19 0130 05/05/19 0217  BP:  95/67  119/81  Pulse:  77  67  Resp:      Temp:      TempSrc:      SpO2:  100%  95%  Weight:      Height:      PainSc: 10-Worst pain ever  7      Isolation Precautions No active isolations  Medications Medications  enoxaparin (LOVENOX) injection 40 mg (40 mg Subcutaneous Given 05/05/19 0005)  sodium chloride flush (NS) 0.9 % injection 3 mL (3 mLs Intravenous Given 05/05/19 0213)  sodium chloride flush (NS) 0.9 % injection 3 mL (has no administration in time range)  0.9 %  sodium chloride infusion (has no administration in time range)  acetaminophen (TYLENOL) tablet 650 mg  (has no administration in time range)    Or  acetaminophen (TYLENOL) suppository 650 mg (has no administration in time range)  feeding supplement (PRO-STAT SUGAR FREE 64) liquid 30 mL (30 mLs Oral Not Given 05/05/19 0000)  aspirin chewable tablet 81 mg (81 mg Oral Given 05/05/19 0004)  carvedilol (COREG) tablet 3.125 mg (has no administration in time range)  traMADol (ULTRAM) tablet 50 mg (50 mg Oral Given 05/05/19 0005)  losartan (COZAAR) tablet 50 mg (has no administration in time range)  rosuvastatin (CRESTOR) tablet 40 mg (40 mg Oral Given 05/05/19 0003)  hydrOXYzine (ATARAX/VISTARIL) tablet 10 mg (has no administration in time range)  pantoprazole (PROTONIX) EC tablet 40 mg (40 mg Oral Given 05/05/19 0004)  albuterol (PROVENTIL) (2.5 MG/3ML) 0.083% nebulizer solution 2.5 mg (has no administration in time range)  benzonatate (TESSALON) capsule 200 mg (200 mg Oral Given 05/05/19 0002)  mometasone-formoterol (DULERA) 200-5 MCG/ACT inhaler 2 puff (2 puffs Inhalation Given 05/05/19 0007)  levofloxacin (LEVAQUIN) IVPB 750 mg (750 mg Intravenous New Bag/Given 05/05/19 0216)  vancomycin (VANCOREADY) IVPB 750 mg/150 mL (has no administration in time range)  morphine 4 MG/ML injection 4 mg (4 mg Intravenous Given 05/04/19 1617)  ondansetron (ZOFRAN) injection 4 mg (4 mg Intravenous Given 05/04/19 1617)  vancomycin (VANCOREADY) IVPB 1750 mg/350 mL (0 mg Intravenous Stopped 05/04/19 1904)  iohexol (OMNIPAQUE) 300 MG/ML solution 75 mL (75 mLs Intravenous Contrast Given 05/04/19 1747)    Mobility walks with device Low fall risk   Focused Assessments Cardiac Assessment Handoff:    Lab Results  Component Value Date  CKTOTAL 92 10/26/2015   TROPONINI 0.13 (H) 10/04/2015   No results found for: DDIMER Does the Patient currently have chest pain? No     R Recommendations: See Admitting Provider Note  Report given to:   Additional Notes:

## 2019-05-05 NOTE — Progress Notes (Signed)
Floor called for report, no further questions asked. Will transport patient to floor.

## 2019-05-05 NOTE — Consult Note (Signed)
NAME: [name]  DOB: 1955/08/30  MRN: [id number]  Date/Time: 05/05/2019 5:53 PM  REQUESTING PROVIDER: Dr.Willams Subjective:  REASON FOR CONSULT: breast cellulitis ? [name] is a 64 y.o. female with a history of CAD, S/p stent, hyperlipidemia  Presented to the ED with a painful swelling under rt arm and breast Pt thought she was bit by something as she noted a boil under her rt arm 10 days ago. She first came to the Ed on 05/01/19 and was given bactrim and pain med. She returned to the ED on 05/04/19 as the swelling and pain was getting worse. Her vitals in the ED were temp of 97.5, BP 114/83, HR 77. She was diagnosed as cellulitis rt breast and started on vanco and levaquin. She had a CT chest which showed There is fat stranding in the right axillary region with mild overlying skin thickening. There is no evidence for well-formed drainable fluid collection. Findings are consistent with a soft tissue infection in the appropriate clinical setting. She denied any fever I am asked to see the patient for the same No trama No shaving No human or animal bites No steroids   Past Medical History:  Diagnosis Date  . CAD (coronary artery disease)    a. NSTEMI 10/03/15; b. cardiac cath 10/04/15: LM nl, ost-pLAD 90% s/p PCI/DES, mLAD 80% s/p PCI/DES, mRCA 30%, D1,2,3 nl, p-mLCX 30%, OM1 &2 min irregs, OM3 normal, EF 30-35%, sev HK of mid-distal ant, apical, inf wall, mod elevated LVEDP  . Chronic bronchitis (HCC)   . Chronic cough   . COPD (chronic obstructive pulmonary disease) (HCC)   . Dysrhythmia   . GERD (gastroesophageal reflux disease)   . Heart murmur   . HLD (hyperlipidemia)   . Ischemic cardiomyopathy    a. echo 10/05/15: EF 50%, mild HK of anteroseptal and anterior wall, nl LV diastolic fxn  . Myocardial infarction (HCC) 09/2015  . Status post primary angioplasty with coronary stent   . Tobacco abuse   . Wears dentures    full upper and lower    Past Surgical History:   Procedure Laterality Date  . CARDIAC CATHETERIZATION N/A 10/04/2015   Procedure: Left Heart Cath and Coronary Angiography;  Surgeon: Iran Ouch, MD;  Location: [facility];  Service: Cardiovascular;  Laterality: N/A;  . CARDIAC CATHETERIZATION N/A 10/04/2015   Procedure: Coronary Stent Intervention;  Surgeon: Iran Ouch, MD;  Location: [facility];  Service: Cardiovascular;  Laterality: N/A;  . CATARACT EXTRACTION W/PHACO Right 12/18/2018   Procedure: CATARACT EXTRACTION PHACO AND INTRAOCULAR LENS PLACEMENT (IOC)  RIGHT;  Surgeon: Lockie Mola, MD;  Location: [facility];  Service: Ophthalmology;  Laterality: Right;  . CATARACT EXTRACTION W/PHACO Left 01/08/2019   Procedure: CATARACT EXTRACTION PHACO AND INTRAOCULAR LENS PLACEMENT (IOC) LEFT;  Surgeon: Lockie Mola, MD;  Location: [facility];  Service: Ophthalmology;  Laterality: Left;  leave arrival at 10:30  Total Time: 00:43.0 Total Equivalent Power: 14.4% Cumulative Dissipated Energy: 6.23    Social History   Socioeconomic History  . Marital status: Divorced    Spouse name: Not on file  . Number of children: Not on file  . Years of education: Not on file  . Highest education level: Not on file  Occupational History  . Not on file  Tobacco Use  . Smoking status: Current Every Day Smoker    Packs/day: 0.50    Years: 42.00    Pack years: 21.00    Types:  Cigarettes  . Smokeless tobacco: Never Used  . Tobacco comment: for 50 years now, currently cut back to 4 or less/day  Substance and Sexual Activity  . Alcohol use: Yes    Alcohol/week: 5.0 standard drinks    Types: 2 Standard drinks or equivalent, 3 Cans of beer per week  . Drug use: No  . Sexual activity: Not on file  Other Topics Concern  . Not on file  Social History Narrative   Lives at home with son and daughter-in-law. Very independent and active at baseline.   Social Determinants of Health   Financial  Resource Strain:   . Difficulty of Paying Living Expenses: Not on file  Food Insecurity:   . Worried About Programme researcher, broadcasting/film/video in the Last Year: Not on file  . Ran Out of Food in the Last Year: Not on file  Transportation Needs:   . Lack of Transportation (Medical): Not on file  . Lack of Transportation (Non-Medical): Not on file  Physical Activity:   . Days of Exercise per Week: Not on file  . Minutes of Exercise per Session: Not on file  Stress:   . Feeling of Stress : Not on file  Social Connections:   . Frequency of Communication with Friends and Family: Not on file  . Frequency of Social Gatherings with Friends and Family: Not on file  . Attends Religious Services: Not on file  . Active Member of Clubs or Organizations: Not on file  . Attends Banker Meetings: Not on file  . Marital Status: Not on file  Intimate Partner Violence:   . Fear of Current or Ex-Partner: Not on file  . Emotionally Abused: Not on file  . Physically Abused: Not on file  . Sexually Abused: Not on file    Family History  Problem Relation Age of Onset  . CAD Mother   . Colon cancer Mother    Allergies  Allergen Reactions  . Penicillins Rash    Has patient had a PCN reaction causing immediate rash, facial/tongue/throat swelling, SOB or lightheadedness with hypotension: Yes Has patient had a PCN reaction causing severe rash involving mucus membranes or skin necrosis: No Has patient had a PCN reaction that required hospitalization No Has patient had a PCN reaction occurring within the last 10 years: No If all of the above answers are "NO", then may proceed with Cephalosporin use.    ? Current Facility-Administered Medications  Medication Dose Route Frequency Provider Last Rate Last Admin  . 0.9 %  sodium chloride infusion  250 mL Intravenous PRN Pearson Grippe, MD      . 0.9 %  sodium chloride infusion   Intravenous Continuous Charise Killian, MD      . acetaminophen (TYLENOL) tablet  650 mg  650 mg Oral Q6H PRN Pearson Grippe, MD       Or  . acetaminophen (TYLENOL) suppository 650 mg  650 mg Rectal Q6H PRN Pearson Grippe, MD      . albuterol (PROVENTIL) (2.5 MG/3ML) 0.083% nebulizer solution 2.5 mg  2.5 mg Inhalation Q6H PRN Pearson Grippe, MD      . aspirin chewable tablet 81 mg  81 mg Oral Daily Pearson Grippe, MD   81 mg at 05/05/19 0841  . benzonatate (TESSALON) capsule 200 mg  200 mg Oral QHS Pearson Grippe, MD   200 mg at 05/05/19 0002  . carvedilol (COREG) tablet 3.125 mg  3.125 mg Oral BID WC Pearson Grippe, MD      .  enoxaparin (LOVENOX) injection 40 mg  40 mg Subcutaneous Q24H Pearson Grippe, MD   40 mg at 05/05/19 0005  . feeding supplement (PRO-STAT SUGAR FREE 64) liquid 30 mL  30 mL Oral BID Pearson Grippe, MD      . hydrOXYzine (ATARAX/VISTARIL) tablet 10 mg  10 mg Oral TID PRN Pearson Grippe, MD      . levofloxacin (LEVAQUIN) IVPB 750 mg  750 mg Intravenous Q24H Pearson Grippe, MD   Stopped at 05/05/19 [id number]  . losartan (COZAAR) tablet 50 mg  50 mg Oral Daily Pearson Grippe, MD      . mometasone-formoterol [facility]) 200-5 MCG/ACT inhaler 2 puff  2 puff Inhalation BID Pearson Grippe, MD   2 puff at 05/05/19 [phone]  . pantoprazole (PROTONIX) EC tablet 40 mg  40 mg Oral Daily Pearson Grippe, MD   40 mg at 05/05/19 0841  . rosuvastatin (CRESTOR) tablet 40 mg  40 mg Oral Daily Pearson Grippe, MD   40 mg at 05/05/19 0003  . sodium chloride flush (NS) 0.9 % injection 3 mL  3 mL Intravenous Q12H Pearson Grippe, MD   3 mL at 05/05/19 0842  . sodium chloride flush (NS) 0.9 % injection 3 mL  3 mL Intravenous PRN Pearson Grippe, MD      . traMADol Janean Sark) tablet 50 mg  50 mg Oral Q12H PRN Pearson Grippe, MD   50 mg at 05/05/19 1218  . vancomycin (VANCOREADY) IVPB 750 mg/150 mL  750 mg Intravenous Q24H Pearson Grippe, MD         Abtx:  Anti-infectives (From admission, onward)   Start     Dose/Rate Route Frequency Ordered Stop   05/05/19 1700  vancomycin (VANCOREADY) IVPB 750 mg/150 mL     750 mg 150 mL/hr over 60 Minutes Intravenous Every 24  hours 05/05/19 0203     05/05/19 0215  levofloxacin (LEVAQUIN) IVPB 750 mg     750 mg 100 mL/hr over 90 Minutes Intravenous Every 24 hours 05/05/19 0201     05/04/19 1630  vancomycin (VANCOREADY) IVPB 1750 mg/350 mL     1,750 mg 175 mL/hr over 120 Minutes Intravenous  Once 05/04/19 1629 05/04/19 1904      REVIEW OF SYSTEMS:  Const: negative fever, negative chills, negative weight loss Eyes: negative diplopia or visual changes, negative eye pain ENT: negative coryza, negative sore throat Resp: baseline cough, some sputum- chronic smoker  Cards: negative for chest pain, palpitations, lower extremity edema GU: negative for frequency, dysuria and hematuria GI: Negative for abdominal pain, diarrhea, bleeding, constipation Skin: negative for rash and pruritus Heme: negative for easy bruising and gum/nose bleeding MS: severe pain rt side of the chest and under arm Neurolo:negative for headaches, dizziness, vertigo, memory problems  Psych: negative for feelings of anxiety, depression  Endocrine: negative for thyroid, diabetes issues Allergy/Immunology- PCN -hives as a baby-?  Objective:  VITALS:  BP 102/63 (BP Location: Left Arm)   Pulse 66   Temp 97.7 F (36.5 C) (Oral)   Resp 17   Ht 5\' 4"  (1.626 m)   Wt 84.4 kg   SpO2 100%   BMI 31.93 kg/m  PHYSICAL EXAM:  General: Alert, cooperative, no distress, appears stated age.  Head: Normocephalic, without obvious abnormality, atraumatic. Eyes: Conjunctivae clear, anicteric sclerae. Pupils are equal ENT Nares normal. No drainage or sinus tenderness. edentulous Neck: Supple, symmetrical, no adenopathy, thyroid: non tender no carotid bruit and no JVD. Back: No CVA tenderness. Lungs: Clear to auscultation bilaterally. No  Wheezing or Rhonchi. No rales. Heart: s1s2 Abdomen: Soft, non-tender,not distended. Bowel sounds normal. No masses Extremities: atraumatic, no cyanosis. No edema. No clubbing Skin: rt arm- on the lateral aspect  below the axilla ont he rt side of the chest there is an erythematous tender, swelling with surrounding erythema extending to the rt breast    Lymph: Cervical, supraclavicular normal. Neurologic: Grossly non-focal Pertinent Labs Lab Results CBC    Component Value Date/Time   WBC 11.0 (H) 05/05/2019 0402   RBC 3.77 (L) 05/05/2019 0402   HGB 11.1 (L) 05/05/2019 0402   HCT 32.0 (L) 05/05/2019 0402   PLT 236 05/05/2019 0402   MCV 84.9 05/05/2019 0402   MCH 29.4 05/05/2019 0402   MCHC 34.7 05/05/2019 0402   RDW 13.6 05/05/2019 0402   LYMPHSABS 1.2 05/04/2019 1558   MONOABS 0.7 05/04/2019 1558   EOSABS 0.3 05/04/2019 1558   BASOSABS 0.1 05/04/2019 1558    CMP Latest Ref Rng & Units 05/05/2019 05/04/2019 04/15/2018  Glucose 70 - 99 mg/dL 117(H) 114(H) 68  BUN 8 - 23 mg/dL 22 22 18   Creatinine 0.44 - 1.00 mg/dL 1.01(H) 1.14(H) 0.90  Sodium 135 - 145 mmol/L 130(L) 133(L) 141  Potassium 3.5 - 5.1 mmol/L 3.7 3.9 4.8  Chloride 98 - 111 mmol/L 96(L) 96(L) 100  CO2 22 - 32 mmol/L 23 25 23   Calcium 8.9 - 10.3 mg/dL 8.8(L) 9.2 9.8  Total Protein 6.5 - 8.1 g/dL 7.0 8.0 7.1  Total Bilirubin 0.3 - 1.2 mg/dL 0.4 0.6 <0.2  Alkaline Phos 38 - 126 U/L 72 83 91  AST 15 - 41 U/L 23 29 21   ALT 0 - 44 U/L 19 20 22       Microbiology: Recent Results (from the past 240 hour(s))  Culture, blood (routine x 2)     Status: None (Preliminary result)   Collection Time: 05/04/19  3:58 PM   Specimen: BLOOD  Result Value Ref Range Status   Specimen Description BLOOD BLOOD LEFT WRIST  Final   Special Requests   Final    BOTTLES DRAWN AEROBIC AND ANAEROBIC Blood Culture results may not be optimal due to an inadequate volume of blood received in culture bottles   Culture   Final    NO GROWTH < 24 HOURS Performed at [facility], [address] [place], [place] [address]    Report Status PENDING  Incomplete  Culture, blood (routine x 2)     Status: None (Preliminary result)   Collection Time:  05/04/19  3:58 PM   Specimen: BLOOD  Result Value Ref Range Status   Specimen Description BLOOD LEFT ANTECUBITAL  Final   Special Requests   Final    BOTTLES DRAWN AEROBIC AND ANAEROBIC Blood Culture results may not be optimal due to an excessive volume of blood received in culture bottles   Culture   Final    NO GROWTH < 24 HOURS Performed at [facility], [address] [place], [place] [address]    Report Status PENDING  Incomplete  Respiratory Panel by RT PCR (Flu A&B, Covid) - Nasopharyngeal Swab     Status: None   Collection Time: 05/04/19  4:31 PM   Specimen: Nasopharyngeal Swab  Result Value Ref Range Status   SARS Coronavirus 2 by RT PCR NEGATIVE NEGATIVE Final    Comment: (NOTE) SARS-CoV-2 target nucleic acids are NOT DETECTED. The SARS-CoV-2 RNA is generally detectable in upper respiratoy specimens during the acute phase of infection. The lowest  concentration of SARS-CoV-2 viral copies this assay can detect is 131 copies/mL. A negative result does not preclude SARS-Cov-2 infection and should not be used as the sole basis for treatment or other patient management decisions. A negative result may occur with  improper specimen collection/handling, submission of specimen other than nasopharyngeal swab, presence of viral mutation(s) within the areas targeted by this assay, and inadequate number of viral copies (<131 copies/mL). A negative result must be combined with clinical observations, patient history, and epidemiological information. The expected result is Negative. Fact Sheet for Patients:  https://www.moore.com/ Fact Sheet for Healthcare Providers:  https://www.young.biz/ This test is not yet ap proved or cleared by the [place] FDA and  has been authorized for detection and/or diagnosis of SARS-CoV-2 by FDA under an Emergency Use Authorization (EUA). This EUA will remain  in effect (meaning this test can be used)  for the duration of the COVID-19 declaration under Section 564(b)(1) of the Act, 21 U.S.C. section 360bbb-3(b)(1), unless the authorization is terminated or revoked sooner.    Influenza A by PCR NEGATIVE NEGATIVE Final   Influenza B by PCR NEGATIVE NEGATIVE Final    Comment: (NOTE) The Xpert Xpress SARS-CoV-2/FLU/RSV assay is intended as an aid in  the diagnosis of influenza from Nasopharyngeal swab specimens and  should not be used as a sole basis for treatment. Nasal washings and  aspirates are unacceptable for Xpert Xpress SARS-CoV-2/FLU/RSV  testing. Fact Sheet for Patients: https://www.moore.com/ Fact Sheet for Healthcare Providers: https://www.young.biz/ This test is not yet approved or cleared by the [place] FDA and  has been authorized for detection and/or diagnosis of SARS-CoV-2 by  FDA under an Emergency Use Authorization (EUA). This EUA will remain  in effect (meaning this test can be used) for the duration of the  Covid-19 declaration under Section 564(b)(1) of the Act, 21  U.S.C. section 360bbb-3(b)(1), unless the authorization is  terminated or revoked. Performed at [facility], [address] [place], [place] [address]     IMAGING RESULTS: Ct scan   I have personally reviewed the films ? Impression/Recommendation ? ?Soft tissue infection with surrounding cellulitis of the rt side of the chest There is an evolving abscess which will need I/D. Likely organism Staph/strep- currently on vanco and levaquin- Dc the latter and change to cefazolin Recommend surgical consult  CAD S/p stent on aspirin,  Cardiomyopathy- on losartan, carvedilol, rosuvastatin  PCN allergy as a child- she likely is not allergic now- So cephalosporin can be used- ? PCN allergy test  Smoker  Discussed the management with the patient and the care team.?

## 2019-05-05 NOTE — Progress Notes (Signed)
PROGRESS NOTE    [name]  TJQ:[id number] DOB: May 07, 1955 DOA: 05/04/2019 PCP: Patient, No Pcp Per       Assessment & Plan:   Principal Problem:   Cellulitis Active Problems:   CAD (coronary artery disease)   HLD (hyperlipidemia)   Hyponatremia   Moderate protein-calorie malnutrition (HCC)    Right breast cellulitis: continue on IV vanco & levaquin. ID consulted. Blood cx NGTD.   Hyponatremia: Na is trending down slightly today. Will continue on IVFs   Moderate Protein Calorie Malnutrition: continue on prostat 30 mL po bid  Hyperglycemia: no hx of DM. Will continue to monitor  AKI: baseline Cr is unknown. Will continue on IVFs. Will continue to monitor   CAD: continue on aspirin, carvedilol, losartan,   HLD: continue on statin  COPD: w/o exacerbation. Continue on bronchodilators    GERD: continue on PPI   OA: unknown severity. Continue on tramadol prn      DVT prophylaxis: lovenox Code Status: full  Family Communication: talked w/ pt's daughter, Ashok Cordia, via the telephone about pt's care and answered all of her questions  Disposition Plan:    Consultants:   ID   Procedures:    Antimicrobials: vanco & levaquin   Subjective: Pt c/o right breast pain   Objective: Vitals:   05/05/19 0009 05/05/19 0217 05/05/19 0334 05/05/19 0811  BP: 95/67 119/81 115/74 98/63  Pulse: 77 67 71 71  Resp:   18 18  Temp:   97.6 F (36.4 C) (!) 97.5 F (36.4 C)  TempSrc:   Oral Oral  SpO2: 100% 95% 96% 96%  Weight:      Height:        Intake/Output Summary (Last 24 hours) at 05/05/2019 0840 Last data filed at 05/05/2019 7622 Gross per 24 hour  Intake 596 ml  Output 300 ml  Net 296 ml   Filed Weights   05/04/19 1506  Weight: 84.8 kg    Examination:  General exam: Appears calm but uncomfortable  Respiratory system: Clear to auscultation. Respiratory effort normal. Cardiovascular system: S1 & S2 +. No, rubs, gallops or clicks.   Gastrointestinal system: Abdomen is obese, soft and nontender.. Normal bowel sounds heard. Central nervous system: Alert and oriented. Moves all 4 extremities Skin: pain to palpation & erythema over right breast & right sided rib caged Psychiatry: Judgement and insight appear normal. Mood & affect appropriate.     Data Reviewed: I have personally reviewed following labs and imaging studies  CBC: Recent Labs  Lab 05/04/19 1558 05/05/19 0402  WBC 12.9* 11.0*  NEUTROABS 10.5*  --   HGB 12.0 11.1*  HCT 37.5 32.0*  MCV 90.1 84.9  PLT 251 236   Basic Metabolic Panel: Recent Labs  Lab 05/04/19 1558 05/05/19 0402  NA 133* 130*  K 3.9 3.7  CL 96* 96*  CO2 25 23  GLUCOSE 114* 117*  BUN 22 22  CREATININE 1.14* 1.01*  CALCIUM 9.2 8.8*   GFR: Estimated Creatinine Clearance: 58.5 mL/min (A) (by C-G formula based on SCr of 1.01 mg/dL (H)). Liver Function Tests: Recent Labs  Lab 05/04/19 1558 05/05/19 0402  AST 29 23  ALT 20 19  ALKPHOS 83 72  BILITOT 0.6 0.4  PROT 8.0 7.0  ALBUMIN 3.4* 2.9*   No results for input(s): LIPASE, AMYLASE in the last 168 hours. No results for input(s): AMMONIA in the last 168 hours. Coagulation Profile: No results for input(s): INR, PROTIME in the last 168 hours. Cardiac Enzymes: No  results for input(s): CKTOTAL, CKMB, CKMBINDEX, TROPONINI in the last 168 hours. BNP (last 3 results) No results for input(s): PROBNP in the last 8760 hours. HbA1C: No results for input(s): HGBA1C in the last 72 hours. CBG: No results for input(s): GLUCAP in the last 168 hours. Lipid Profile: No results for input(s): CHOL, HDL, LDLCALC, TRIG, CHOLHDL, LDLDIRECT in the last 72 hours. Thyroid Function Tests: No results for input(s): TSH, T4TOTAL, FREET4, T3FREE, THYROIDAB in the last 72 hours. Anemia Panel: No results for input(s): VITAMINB12, FOLATE, FERRITIN, TIBC, IRON, RETICCTPCT in the last 72 hours. Sepsis Labs: Recent Labs  Lab 05/04/19 1558   LATICACIDVEN 1.2    Recent Results (from the past 240 hour(s))  Culture, blood (routine x 2)     Status: None (Preliminary result)   Collection Time: 05/04/19  3:58 PM   Specimen: BLOOD  Result Value Ref Range Status   Specimen Description BLOOD BLOOD LEFT WRIST  Final   Special Requests   Final    BOTTLES DRAWN AEROBIC AND ANAEROBIC Blood Culture results may not be optimal due to an inadequate volume of blood received in culture bottles   Culture   Final    NO GROWTH < 24 HOURS Performed at [facility], [address] [place], [place] [address]    Report Status PENDING  Incomplete  Culture, blood (routine x 2)     Status: None (Preliminary result)   Collection Time: 05/04/19  3:58 PM   Specimen: BLOOD  Result Value Ref Range Status   Specimen Description BLOOD LEFT ANTECUBITAL  Final   Special Requests   Final    BOTTLES DRAWN AEROBIC AND ANAEROBIC Blood Culture results may not be optimal due to an excessive volume of blood received in culture bottles   Culture   Final    NO GROWTH < 24 HOURS Performed at [facility], [address], [place], [place] [address]    Report Status PENDING  Incomplete  Respiratory Panel by RT PCR (Flu A&B, Covid) - Nasopharyngeal Swab     Status: None   Collection Time: 05/04/19  4:31 PM   Specimen: Nasopharyngeal Swab  Result Value Ref Range Status   SARS Coronavirus 2 by RT PCR NEGATIVE NEGATIVE Final    Comment: (NOTE) SARS-CoV-2 target nucleic acids are NOT DETECTED. The SARS-CoV-2 RNA is generally detectable in upper respiratoy specimens during the acute phase of infection. The lowest concentration of SARS-CoV-2 viral copies this assay can detect is 131 copies/mL. A negative result does not preclude SARS-Cov-2 infection and should not be used as the sole basis for treatment or other patient management decisions. A negative result may occur with  improper specimen collection/handling, submission of specimen  other than nasopharyngeal swab, presence of viral mutation(s) within the areas targeted by this assay, and inadequate number of viral copies (<131 copies/mL). A negative result must be combined with clinical observations, patient history, and epidemiological information. The expected result is Negative. Fact Sheet for Patients:  https://www.moore.com/ Fact Sheet for Healthcare Providers:  https://www.young.biz/ This test is not yet ap proved or cleared by the [place] FDA and  has been authorized for detection and/or diagnosis of SARS-CoV-2 by FDA under an Emergency Use Authorization (EUA). This EUA will remain  in effect (meaning this test can be used) for the duration of the COVID-19 declaration under Section 564(b)(1) of the Act, 21 U.S.C. section 360bbb-3(b)(1), unless the authorization is terminated or revoked sooner.    Influenza A by PCR NEGATIVE  NEGATIVE Final   Influenza B by PCR NEGATIVE NEGATIVE Final    Comment: (NOTE) The Xpert Xpress SARS-CoV-2/FLU/RSV assay is intended as an aid in  the diagnosis of influenza from Nasopharyngeal swab specimens and  should not be used as a sole basis for treatment. Nasal washings and  aspirates are unacceptable for Xpert Xpress SARS-CoV-2/FLU/RSV  testing. Fact Sheet for Patients: PinkCheek.be Fact Sheet for Healthcare Providers: GravelBags.it This test is not yet approved or cleared by the [place] FDA and  has been authorized for detection and/or diagnosis of SARS-CoV-2 by  FDA under an Emergency Use Authorization (EUA). This EUA will remain  in effect (meaning this test can be used) for the duration of the  Covid-19 declaration under Section 564(b)(1) of the Act, 21  U.S.C. section 360bbb-3(b)(1), unless the authorization is  terminated or revoked. Performed at [facility], [address], [place], [place]  [address]          Radiology Studies: CT Chest W Contrast  Result Date: 05/04/2019 CLINICAL DATA:  Cellulitis of the axilla with concern for abscess. EXAM: CT CHEST WITH CONTRAST TECHNIQUE: Multidetector CT imaging of the chest was performed during intravenous contrast administration. CONTRAST:  28mL OMNIPAQUE IOHEXOL 300 MG/ML  SOLN COMPARISON:  None. FINDINGS: Cardiovascular: Aortic calcifications are noted without evidence for dissection or aneurysm. The heart size is normal. Coronary artery calcifications are noted. There is no large centrally located pulmonary embolism. Mediastinum/Nodes: --No mediastinal or hilar lymphadenopathy. --there is mild right axillary adenopathy, presumably reactive. --No supraclavicular lymphadenopathy. --Normal thyroid gland. --the esophagus is fluid-filled to the level of the mid thorax. Lungs/Pleura: No pulmonary nodules or masses. No pleural effusion or pneumothorax. No focal airspace consolidation. No focal pleural abnormality. Upper Abdomen: No acute abnormality. Musculoskeletal: There is fat stranding in the right axillary region with mild overlying skin thickening. There is no evidence for well-formed drainable fluid collection. IMPRESSION: 1. There is fat stranding in the right axillary region with mild overlying skin thickening. There is no evidence for well-formed drainable fluid collection. Findings are consistent with a soft tissue infection in the appropriate clinical setting. 2. Coronary artery disease. Aortic Atherosclerosis (ICD10-I70.0). Electronically Signed   By: Constance Holster M.D.   On: 05/04/2019 18:16        Scheduled Meds: . aspirin  81 mg Oral Daily  . benzonatate  200 mg Oral QHS  . carvedilol  3.125 mg Oral BID WC  . enoxaparin (LOVENOX) injection  40 mg Subcutaneous Q24H  . feeding supplement (PRO-STAT SUGAR FREE 64)  30 mL Oral BID  . losartan  50 mg Oral Daily  . mometasone-formoterol  2 puff Inhalation BID  . pantoprazole  40  mg Oral Daily  . rosuvastatin  40 mg Oral Daily  . sodium chloride flush  3 mL Intravenous Q12H   Continuous Infusions: . sodium chloride    . levofloxacin (LEVAQUIN) IV Stopped (05/05/19 0353)  . vancomycin       LOS: 0 days    Time spent:  32 mins    Wyvonnia Dusky, MD [facility] Pager [phone]  If 7PM-7AM, please contact night-coverage [email or web address] Password [email or web address] 05/05/2019, 8:40 AM

## 2019-05-06 ENCOUNTER — Encounter: Payer: Self-pay | Admitting: Internal Medicine

## 2019-05-06 ENCOUNTER — Encounter
Admission: EM | Disposition: A | Discharge status: Home / Self Care | Payer: Self-pay | Source: Home / Self Care | Attending: Internal Medicine

## 2019-05-06 ENCOUNTER — Encounter: Payer: Self-pay | Admitting: Anesthesiology

## 2019-05-06 ENCOUNTER — Inpatient Hospital Stay: Payer: Medicare Other | Admitting: Anesthesiology

## 2019-05-06 DIAGNOSIS — Z955 Presence of coronary angioplasty implant and graft: Secondary | ICD-10-CM | POA: Diagnosis not present

## 2019-05-06 DIAGNOSIS — M1990 Unspecified osteoarthritis, unspecified site: Secondary | ICD-10-CM | POA: Diagnosis present

## 2019-05-06 DIAGNOSIS — I252 Old myocardial infarction: Secondary | ICD-10-CM | POA: Diagnosis not present

## 2019-05-06 DIAGNOSIS — L02419 Cutaneous abscess of limb, unspecified: Secondary | ICD-10-CM | POA: Diagnosis not present

## 2019-05-06 DIAGNOSIS — E782 Mixed hyperlipidemia: Secondary | ICD-10-CM | POA: Diagnosis not present

## 2019-05-06 DIAGNOSIS — R739 Hyperglycemia, unspecified: Secondary | ICD-10-CM | POA: Diagnosis present

## 2019-05-06 DIAGNOSIS — Z6835 Body mass index (BMI) 35.0-35.9, adult: Secondary | ICD-10-CM | POA: Diagnosis not present

## 2019-05-06 DIAGNOSIS — L02411 Cutaneous abscess of right axilla: Secondary | ICD-10-CM | POA: Diagnosis present

## 2019-05-06 DIAGNOSIS — E669 Obesity, unspecified: Secondary | ICD-10-CM | POA: Diagnosis present

## 2019-05-06 DIAGNOSIS — L03313 Cellulitis of chest wall: Secondary | ICD-10-CM | POA: Diagnosis not present

## 2019-05-06 DIAGNOSIS — N611 Abscess of the breast and nipple: Secondary | ICD-10-CM | POA: Diagnosis present

## 2019-05-06 DIAGNOSIS — L03119 Cellulitis of unspecified part of limb: Secondary | ICD-10-CM | POA: Diagnosis not present

## 2019-05-06 DIAGNOSIS — L02213 Cutaneous abscess of chest wall: Secondary | ICD-10-CM | POA: Diagnosis not present

## 2019-05-06 DIAGNOSIS — K08109 Complete loss of teeth, unspecified cause, unspecified class: Secondary | ICD-10-CM | POA: Diagnosis present

## 2019-05-06 DIAGNOSIS — N179 Acute kidney failure, unspecified: Secondary | ICD-10-CM | POA: Diagnosis present

## 2019-05-06 DIAGNOSIS — Z7982 Long term (current) use of aspirin: Secondary | ICD-10-CM | POA: Diagnosis not present

## 2019-05-06 DIAGNOSIS — E785 Hyperlipidemia, unspecified: Secondary | ICD-10-CM | POA: Diagnosis present

## 2019-05-06 DIAGNOSIS — F17210 Nicotine dependence, cigarettes, uncomplicated: Secondary | ICD-10-CM | POA: Diagnosis present

## 2019-05-06 DIAGNOSIS — Z79899 Other long term (current) drug therapy: Secondary | ICD-10-CM | POA: Diagnosis not present

## 2019-05-06 DIAGNOSIS — L0390 Cellulitis, unspecified: Secondary | ICD-10-CM | POA: Diagnosis present

## 2019-05-06 DIAGNOSIS — I2510 Atherosclerotic heart disease of native coronary artery without angina pectoris: Secondary | ICD-10-CM | POA: Diagnosis present

## 2019-05-06 DIAGNOSIS — E440 Moderate protein-calorie malnutrition: Secondary | ICD-10-CM | POA: Diagnosis present

## 2019-05-06 DIAGNOSIS — J449 Chronic obstructive pulmonary disease, unspecified: Secondary | ICD-10-CM | POA: Diagnosis present

## 2019-05-06 DIAGNOSIS — B9562 Methicillin resistant Staphylococcus aureus infection as the cause of diseases classified elsewhere: Secondary | ICD-10-CM | POA: Diagnosis present

## 2019-05-06 DIAGNOSIS — Z20822 Contact with and (suspected) exposure to covid-19: Secondary | ICD-10-CM | POA: Diagnosis present

## 2019-05-06 DIAGNOSIS — I255 Ischemic cardiomyopathy: Secondary | ICD-10-CM | POA: Diagnosis present

## 2019-05-06 DIAGNOSIS — Z23 Encounter for immunization: Secondary | ICD-10-CM | POA: Diagnosis not present

## 2019-05-06 DIAGNOSIS — E871 Hypo-osmolality and hyponatremia: Secondary | ICD-10-CM | POA: Diagnosis present

## 2019-05-06 DIAGNOSIS — K219 Gastro-esophageal reflux disease without esophagitis: Secondary | ICD-10-CM | POA: Diagnosis present

## 2019-05-06 DIAGNOSIS — L03111 Cellulitis of right axilla: Secondary | ICD-10-CM | POA: Diagnosis present

## 2019-05-06 DIAGNOSIS — N61 Mastitis without abscess: Secondary | ICD-10-CM | POA: Diagnosis not present

## 2019-05-06 HISTORY — PX: INCISION AND DRAINAGE ABSCESS: SHX5864

## 2019-05-06 LAB — CBC
HCT: 34.5 % — ABNORMAL LOW (ref 36.0–46.0)
Hemoglobin: 11.4 g/dL — ABNORMAL LOW (ref 12.0–15.0)
MCH: 29.4 pg (ref 26.0–34.0)
MCHC: 33 g/dL (ref 30.0–36.0)
MCV: 88.9 fL (ref 80.0–100.0)
Platelets: 256 10*3/uL (ref 150–400)
RBC: 3.88 MIL/uL (ref 3.87–5.11)
RDW: 13.7 % (ref 11.5–15.5)
WBC: 8.7 10*3/uL (ref 4.0–10.5)
nRBC: 0 % (ref 0.0–0.2)

## 2019-05-06 LAB — BASIC METABOLIC PANEL
Anion gap: 11 (ref 5–15)
BUN: 11 mg/dL (ref 8–23)
CO2: 24 mmol/L (ref 22–32)
Calcium: 8.9 mg/dL (ref 8.9–10.3)
Chloride: 99 mmol/L (ref 98–111)
Creatinine, Ser: 0.66 mg/dL (ref 0.44–1.00)
GFR calc Af Amer: >60 mL/min (ref >60)
GFR calc non Af Amer: >60 mL/min (ref >60)
Glucose, Bld: 93 mg/dL (ref 70–99)
Potassium: 3.9 mmol/L (ref 3.5–5.1)
Sodium: 134 mmol/L — ABNORMAL LOW (ref 135–145)

## 2019-05-06 SURGERY — INCISION AND DRAINAGE, ABSCESS
Anesthesia: General | Laterality: Right

## 2019-05-06 SURGERY — INCISION AND DRAINAGE, ABSCESS
Anesthesia: Choice | Laterality: Left

## 2019-05-06 MED ORDER — LIDOCAINE HCL (CARDIAC) PF 100 MG/5ML IV SOSY
PREFILLED_SYRINGE | INTRAVENOUS | Status: DC | PRN
  Administered 2019-05-06: 100 mg via INTRAVENOUS

## 2019-05-06 MED ORDER — DEXMEDETOMIDINE HCL 200 MCG/2ML IV SOLN
INTRAVENOUS | Status: DC | PRN
  Administered 2019-05-06: 8 ug via INTRAVENOUS

## 2019-05-06 MED ORDER — FENTANYL CITRATE (PF) 100 MCG/2ML IJ SOLN: 25.0000 ug | INTRAMUSCULAR | Status: DC | PRN

## 2019-05-06 MED ORDER — DEXAMETHASONE SODIUM PHOSPHATE 10 MG/ML IJ SOLN
INTRAMUSCULAR | Status: DC | PRN
  Administered 2019-05-06: 10 mg via INTRAVENOUS

## 2019-05-06 MED ORDER — ONDANSETRON HCL 4 MG/2ML IJ SOLN
INTRAMUSCULAR | Status: DC | PRN
  Administered 2019-05-06: 4 mg via INTRAVENOUS

## 2019-05-06 MED ORDER — FENTANYL CITRATE (PF) 100 MCG/2ML IJ SOLN
INTRAMUSCULAR | Status: AC
  Filled 2019-05-06: qty 2

## 2019-05-06 MED ORDER — BUPIVACAINE-EPINEPHRINE 0.25% -1:200000 IJ SOLN
INTRAMUSCULAR | Status: DC | PRN
  Administered 2019-05-06: 3 mL

## 2019-05-06 MED ORDER — ONDANSETRON HCL 4 MG/2ML IJ SOLN: 4.0000 mg | Freq: Once | INTRAMUSCULAR | Status: DC | PRN

## 2019-05-06 MED ORDER — PROPOFOL 10 MG/ML IV BOLUS
INTRAVENOUS | Status: DC | PRN
  Administered 2019-05-06: 150 mg via INTRAVENOUS

## 2019-05-06 MED ORDER — LIDOCAINE HCL 1 % IJ SOLN
INTRAMUSCULAR | Status: DC | PRN
  Administered 2019-05-06: 3 mL

## 2019-05-06 MED ORDER — VANCOMYCIN HCL 1250 MG/250ML IV SOLN
1250.0000 mg | INTRAVENOUS | Status: DC
  Administered 2019-05-06 – 2019-05-07 (×2): 1250 mg via INTRAVENOUS
  Filled 2019-05-06 (×3): qty 250

## 2019-05-06 MED ORDER — BUPIVACAINE HCL (PF) 0.25 % IJ SOLN
INTRAMUSCULAR | Status: AC
  Filled 2019-05-06: qty 30

## 2019-05-06 MED ORDER — PROPOFOL 10 MG/ML IV BOLUS
INTRAVENOUS | Status: AC
  Filled 2019-05-06: qty 20

## 2019-05-06 MED ORDER — MIDAZOLAM HCL 2 MG/2ML IJ SOLN
INTRAMUSCULAR | Status: DC | PRN
  Administered 2019-05-06: 2 mg via INTRAVENOUS

## 2019-05-06 MED ORDER — EPINEPHRINE PF 1 MG/ML IJ SOLN
INTRAMUSCULAR | Status: AC
  Filled 2019-05-06: qty 1

## 2019-05-06 MED ORDER — MORPHINE SULFATE (PF) 4 MG/ML IV SOLN: 4.0000 mg | INTRAVENOUS | Status: DC | PRN

## 2019-05-06 MED ORDER — ONDANSETRON HCL 4 MG/2ML IJ SOLN
INTRAMUSCULAR | Status: AC
  Filled 2019-05-06: qty 2

## 2019-05-06 MED ORDER — MORPHINE SULFATE (PF) 2 MG/ML IV SOLN: 2.0000 mg | INTRAVENOUS | Status: DC | PRN

## 2019-05-06 MED ORDER — SUGAMMADEX SODIUM 500 MG/5ML IV SOLN
INTRAVENOUS | Status: DC | PRN
  Administered 2019-05-06: 200 mg via INTRAVENOUS

## 2019-05-06 MED ORDER — FENTANYL CITRATE (PF) 100 MCG/2ML IJ SOLN
INTRAMUSCULAR | Status: DC | PRN
  Administered 2019-05-06: 25 ug via INTRAVENOUS
  Administered 2019-05-06: 50 ug via INTRAVENOUS
  Administered 2019-05-06: 25 ug via INTRAVENOUS

## 2019-05-06 MED ORDER — ROCURONIUM BROMIDE 100 MG/10ML IV SOLN
INTRAVENOUS | Status: DC | PRN
  Administered 2019-05-06: 10 mg via INTRAVENOUS

## 2019-05-06 MED ORDER — MIDAZOLAM HCL 2 MG/2ML IJ SOLN
INTRAMUSCULAR | Status: AC
  Filled 2019-05-06: qty 2

## 2019-05-06 MED ORDER — HEMOSTATIC AGENTS (NO CHARGE) OPTIME
TOPICAL | Status: DC | PRN
  Administered 2019-05-06: 1 via TOPICAL

## 2019-05-06 MED ORDER — LIDOCAINE HCL (PF) 1 % IJ SOLN
INTRAMUSCULAR | Status: AC
  Filled 2019-05-06: qty 30

## 2019-05-06 MED ORDER — PHENYLEPHRINE HCL (PRESSORS) 10 MG/ML IV SOLN
INTRAVENOUS | Status: DC | PRN
  Administered 2019-05-06: 200 ug via INTRAVENOUS
  Administered 2019-05-06: 100 ug via INTRAVENOUS

## 2019-05-06 MED ORDER — SUCCINYLCHOLINE CHLORIDE 20 MG/ML IJ SOLN
INTRAMUSCULAR | Status: DC | PRN
  Administered 2019-05-06: 200 mg via INTRAVENOUS

## 2019-05-06 MED ORDER — LIDOCAINE HCL (PF) 2 % IJ SOLN
INTRAMUSCULAR | Status: AC
  Filled 2019-05-06: qty 10

## 2019-05-06 MED ORDER — LACTATED RINGERS IV SOLN: INTRAVENOUS | Status: DC | PRN

## 2019-05-06 SURGICAL SUPPLY — 33 items
BLADE SURG 15 STRL LF DISP TIS (BLADE) ×1 IMPLANT
BLADE SURG 15 STRL SS (BLADE) ×2
BNDG GAUZE 4.5X4.1 6PLY STRL (MISCELLANEOUS) ×3 IMPLANT
CANISTER SUCT 1200ML W/VALVE (MISCELLANEOUS) ×3 IMPLANT
CHLORAPREP W/TINT 26 (MISCELLANEOUS) ×3 IMPLANT
COVER WAND RF STERILE (DRAPES) ×3 IMPLANT
DRAPE LAPAROTOMY 100X77 ABD (DRAPES) ×3 IMPLANT
ELECT REM PT RETURN 9FT ADLT (ELECTROSURGICAL) ×3
ELECTRODE REM PT RTRN 9FT ADLT (ELECTROSURGICAL) ×1 IMPLANT
GAUZE PACKING IODOFORM 1/2 (PACKING) IMPLANT
GAUZE SPONGE 4X4 12PLY STRL (GAUZE/BANDAGES/DRESSINGS) ×3 IMPLANT
GLOVE BIOGEL PI IND STRL 7.0 (GLOVE) ×1 IMPLANT
GLOVE BIOGEL PI INDICATOR 7.0 (GLOVE) ×2
GLOVE SURG SYN 6.5 ES PF (GLOVE) ×6 IMPLANT
GOWN STRL REUS W/ TWL LRG LVL3 (GOWN DISPOSABLE) ×2 IMPLANT
GOWN STRL REUS W/TWL LRG LVL3 (GOWN DISPOSABLE) ×4
HEMOSTAT SURGICEL 2X14 (HEMOSTASIS) ×3 IMPLANT
HEMOSTAT SURGICEL 4X8 (HEMOSTASIS) IMPLANT
LABEL OR SOLS (LABEL) ×3 IMPLANT
LIGHT WAVEGUIDE WIDE FLAT (MISCELLANEOUS) ×3 IMPLANT
NEEDLE HYPO 22GX1.5 SAFETY (NEEDLE) ×3 IMPLANT
NS IRRIG 500ML POUR BTL (IV SOLUTION) ×3 IMPLANT
PACK BASIN MINOR ARMC (MISCELLANEOUS) ×3 IMPLANT
SPONGE LAP 18X18 RF (DISPOSABLE) ×3 IMPLANT
SUT MNCRL 4-0 (SUTURE) ×2
SUT MNCRL 4-0 27XMFL (SUTURE) ×1
SUT SILK 3 0 SH 30 (SUTURE) ×3 IMPLANT
SUT VIC AB 2-0 CT2 27 (SUTURE) ×3 IMPLANT
SUT VIC AB 3-0 SH 27 (SUTURE) ×2
SUT VIC AB 3-0 SH 27X BRD (SUTURE) ×1 IMPLANT
SUTURE MNCRL 4-0 27XMF (SUTURE) ×1 IMPLANT
SYR 10ML LL (SYRINGE) ×3 IMPLANT
TOWEL OR 17X26 4PK STRL BLUE (TOWEL DISPOSABLE) ×3 IMPLANT

## 2019-05-06 SURGICAL SUPPLY — 27 items
BLADE SURG 15 STRL LF DISP TIS (BLADE) ×1 IMPLANT
BLADE SURG 15 STRL SS (BLADE) ×2
CANISTER SUCT 1200ML W/VALVE (MISCELLANEOUS) ×3 IMPLANT
CHLORAPREP W/TINT 26 (MISCELLANEOUS) ×3 IMPLANT
COVER WAND RF STERILE (DRAPES) ×3 IMPLANT
DRAPE LAPAROTOMY 100X77 ABD (DRAPES) ×3 IMPLANT
ELECT REM PT RETURN 9FT ADLT (ELECTROSURGICAL) ×3
ELECTRODE REM PT RTRN 9FT ADLT (ELECTROSURGICAL) ×1 IMPLANT
GAUZE PACKING IODOFORM 1/2 (PACKING) IMPLANT
GAUZE SPONGE 4X4 12PLY STRL (GAUZE/BANDAGES/DRESSINGS) ×3 IMPLANT
GLOVE BIOGEL PI IND STRL 7.0 (GLOVE) ×1 IMPLANT
GLOVE BIOGEL PI INDICATOR 7.0 (GLOVE) ×2
GLOVE SURG SYN 6.5 ES PF (GLOVE) ×6 IMPLANT
GOWN STRL REUS W/ TWL LRG LVL3 (GOWN DISPOSABLE) ×2 IMPLANT
GOWN STRL REUS W/TWL LRG LVL3 (GOWN DISPOSABLE) ×4
LABEL OR SOLS (LABEL) ×3 IMPLANT
NEEDLE HYPO 22GX1.5 SAFETY (NEEDLE) ×3 IMPLANT
NS IRRIG 500ML POUR BTL (IV SOLUTION) ×3 IMPLANT
PACK BASIN MINOR ARMC (MISCELLANEOUS) ×3 IMPLANT
SUT MNCRL 4-0 (SUTURE) ×2
SUT MNCRL 4-0 27XMFL (SUTURE) ×1
SUT VIC AB 2-0 CT2 27 (SUTURE) ×3 IMPLANT
SUT VIC AB 3-0 SH 27 (SUTURE) ×2
SUT VIC AB 3-0 SH 27X BRD (SUTURE) ×1 IMPLANT
SUTURE MNCRL 4-0 27XMF (SUTURE) ×1 IMPLANT
SYR 10ML LL (SYRINGE) ×3 IMPLANT
TOWEL OR 17X26 4PK STRL BLUE (TOWEL DISPOSABLE) ×3 IMPLANT

## 2019-05-06 NOTE — Progress Notes (Signed)
PROGRESS NOTE    [name]  UUV:[id number] DOB: 1955/12/08 DOA: 05/04/2019 PCP: Patient, No Pcp Per       Assessment & Plan:   Principal Problem:   Cellulitis Active Problems:   CAD (coronary artery disease)   HLD (hyperlipidemia)   Hyponatremia   Moderate protein-calorie malnutrition (HCC)   Cellulitis of right breast    Right axilla & breast cellulitis: w/ possible evolving abscess but CT chest shows no evidence for well-formed drainable fluid collection. Gen surg consulted and recs apprec. NPO until general surg sees pt for possible I&D. Continue on IV vanco & cefazolin as per ID. ID following and recs apprec. Blood cx NGTD.   Hyponatremia: trending up, almost back in WNL. Will continue on IVFs   Moderate protein calorie malnutrition: continue on prostat 30 mL po bid  Hyperglycemia: no hx of DM. Will continue to monitor  AKI: baseline Cr is unknown. Resolved  CAD: continue on aspirin, carvedilol, losartan,   HLD: continue on statin  COPD: w/o exacerbation. Continue on bronchodilators    GERD: continue on PPI   OA: unknown severity. Continue on tramadol prn      DVT prophylaxis: lovenox Code Status: full  Family Communication: talked w/ pt's daughter, [name] at [phone] , via the telephone about pt's care and answered all of her questions  Disposition Plan:    Consultants:   ID, general surg   Procedures:    Antimicrobials: vanco & cefazolin   Subjective: Pt c/o right breast & right armpit pain   Objective: Vitals:   05/05/19 1201 05/05/19 1630 05/06/19 0418 05/06/19 0500  BP: (!) 129/92 102/63 (!) 132/93   Pulse: 69 66 81   Resp: 17  16   Temp: 97.6 F (36.4 C) 97.7 F (36.5 C) 97.6 F (36.4 C)   TempSrc: Oral Oral Oral   SpO2: 95% 100% 97%   Weight:    83.4 kg  Height:        Intake/Output Summary (Last 24 hours) at 05/06/2019 0755 Last data filed at 05/06/2019 0622 Gross per 24 hour  Intake 1641.96 ml  Output  950 ml  Net 691.96 ml   Filed Weights   05/04/19 1506 05/05/19 1158 05/06/19 0500  Weight: 84.8 kg 84.4 kg 83.4 kg    Examination:  General exam: Appears calm but uncomfortable  Respiratory system: Clear to auscultation. No wheezes, rales, or rhonchi. Respiratory effort normal. Cardiovascular system: S1 & S2 +. No, rubs, gallops or clicks.  Gastrointestinal system: Abdomen is obese, soft and nontender.. Normal bowel sounds heard. Central nervous system: Alert and oriented. Moves all 4 extremities Skin: pain to palpation & erythema over right breast & right sided rib cage Psychiatry: Judgement and insight appear normal. Flat mood and affect      Data Reviewed: I have personally reviewed following labs and imaging studies  CBC: Recent Labs  Lab 05/04/19 1558 05/05/19 0402 05/06/19 0601  WBC 12.9* 11.0* 8.7  NEUTROABS 10.5*  --   --   HGB 12.0 11.1* 11.4*  HCT 37.5 32.0* 34.5*  MCV 90.1 84.9 88.9  PLT 251 236 756   Basic Metabolic Panel: Recent Labs  Lab 05/04/19 1558 05/05/19 0402 05/06/19 0601  NA 133* 130* 134*  K 3.9 3.7 3.9  CL 96* 96* 99  CO2 25 23 24   GLUCOSE 114* 117* 93  BUN 22 22 11   CREATININE 1.14* 1.01* 0.66  CALCIUM 9.2 8.8* 8.9   GFR: Estimated Creatinine Clearance: 73.3 mL/min (by C-G  formula based on SCr of 0.66 mg/dL). Liver Function Tests: Recent Labs  Lab 05/04/19 1558 05/05/19 0402  AST 29 23  ALT 20 19  ALKPHOS 83 72  BILITOT 0.6 0.4  PROT 8.0 7.0  ALBUMIN 3.4* 2.9*   No results for input(s): LIPASE, AMYLASE in the last 168 hours. No results for input(s): AMMONIA in the last 168 hours. Coagulation Profile: No results for input(s): INR, PROTIME in the last 168 hours. Cardiac Enzymes: No results for input(s): CKTOTAL, CKMB, CKMBINDEX, TROPONINI in the last 168 hours. BNP (last 3 results) No results for input(s): PROBNP in the last 8760 hours. HbA1C: No results for input(s): HGBA1C in the last 72 hours. CBG: No results for  input(s): GLUCAP in the last 168 hours. Lipid Profile: No results for input(s): CHOL, HDL, LDLCALC, TRIG, CHOLHDL, LDLDIRECT in the last 72 hours. Thyroid Function Tests: No results for input(s): TSH, T4TOTAL, FREET4, T3FREE, THYROIDAB in the last 72 hours. Anemia Panel: No results for input(s): VITAMINB12, FOLATE, FERRITIN, TIBC, IRON, RETICCTPCT in the last 72 hours. Sepsis Labs: Recent Labs  Lab 05/04/19 1558  LATICACIDVEN 1.2    Recent Results (from the past 240 hour(s))  Culture, blood (routine x 2)     Status: None (Preliminary result)   Collection Time: 05/04/19  3:58 PM   Specimen: BLOOD  Result Value Ref Range Status   Specimen Description BLOOD BLOOD LEFT WRIST  Final   Special Requests   Final    BOTTLES DRAWN AEROBIC AND ANAEROBIC Blood Culture results may not be optimal due to an inadequate volume of blood received in culture bottles   Culture   Final    NO GROWTH < 24 HOURS Performed at [facility], [address] [place], [place] [address]    Report Status PENDING  Incomplete  Culture, blood (routine x 2)     Status: None (Preliminary result)   Collection Time: 05/04/19  3:58 PM   Specimen: BLOOD  Result Value Ref Range Status   Specimen Description BLOOD LEFT ANTECUBITAL  Final   Special Requests   Final    BOTTLES DRAWN AEROBIC AND ANAEROBIC Blood Culture results may not be optimal due to an excessive volume of blood received in culture bottles   Culture   Final    NO GROWTH < 24 HOURS Performed at [facility], [address], [place], [place] [address]    Report Status PENDING  Incomplete  Respiratory Panel by RT PCR (Flu A&B, Covid) - Nasopharyngeal Swab     Status: None   Collection Time: 05/04/19  4:31 PM   Specimen: Nasopharyngeal Swab  Result Value Ref Range Status   SARS Coronavirus 2 by RT PCR NEGATIVE NEGATIVE Final    Comment: (NOTE) SARS-CoV-2 target nucleic acids are NOT DETECTED. The SARS-CoV-2 RNA is generally  detectable in upper respiratoy specimens during the acute phase of infection. The lowest concentration of SARS-CoV-2 viral copies this assay can detect is 131 copies/mL. A negative result does not preclude SARS-Cov-2 infection and should not be used as the sole basis for treatment or other patient management decisions. A negative result may occur with  improper specimen collection/handling, submission of specimen other than nasopharyngeal swab, presence of viral mutation(s) within the areas targeted by this assay, and inadequate number of viral copies (<131 copies/mL). A negative result must be combined with clinical observations, patient history, and epidemiological information. The expected result is Negative. Fact Sheet for Patients:  https://www.moore.com/ Fact Sheet for Healthcare Providers:  https://www.young.biz/ This test is not yet ap proved or cleared by the [place] and  has been authorized for detection and/or diagnosis of SARS-CoV-2 by FDA under an Emergency Use Authorization (EUA). This EUA will remain  in effect (meaning this test can be used) for the duration of the COVID-19 declaration under Section 564(b)(1) of the Act, 21 U.S.C. section 360bbb-3(b)(1), unless the authorization is terminated or revoked sooner.    Influenza A by PCR NEGATIVE NEGATIVE Final   Influenza B by PCR NEGATIVE NEGATIVE Final    Comment: (NOTE) The Xpert Xpress SARS-CoV-2/FLU/RSV assay is intended as an aid in  the diagnosis of influenza from Nasopharyngeal swab specimens and  should not be used as a sole basis for treatment. Nasal washings and  aspirates are unacceptable for Xpert Xpress SARS-CoV-2/FLU/RSV  testing. Fact Sheet for Patients: https://www.moore.com/ Fact Sheet for Healthcare Providers: https://www.young.biz/ This test is not yet approved or cleared by the [place] FDA and  has been  authorized for detection and/or diagnosis of SARS-CoV-2 by  FDA under an Emergency Use Authorization (EUA). This EUA will remain  in effect (meaning this test can be used) for the duration of the  Covid-19 declaration under Section 564(b)(1) of the Act, 21  U.S.C. section 360bbb-3(b)(1), unless the authorization is  terminated or revoked. Performed at [facility], [address], [place], [place] [address]          Radiology Studies: CT Chest W Contrast  Result Date: 05/04/2019 CLINICAL DATA:  Cellulitis of the axilla with concern for abscess. EXAM: CT CHEST WITH CONTRAST TECHNIQUE: Multidetector CT imaging of the chest was performed during intravenous contrast administration. CONTRAST:  1mL OMNIPAQUE IOHEXOL 300 MG/ML  SOLN COMPARISON:  None. FINDINGS: Cardiovascular: Aortic calcifications are noted without evidence for dissection or aneurysm. The heart size is normal. Coronary artery calcifications are noted. There is no large centrally located pulmonary embolism. Mediastinum/Nodes: --No mediastinal or hilar lymphadenopathy. --there is mild right axillary adenopathy, presumably reactive. --No supraclavicular lymphadenopathy. --Normal thyroid gland. --the esophagus is fluid-filled to the level of the mid thorax. Lungs/Pleura: No pulmonary nodules or masses. No pleural effusion or pneumothorax. No focal airspace consolidation. No focal pleural abnormality. Upper Abdomen: No acute abnormality. Musculoskeletal: There is fat stranding in the right axillary region with mild overlying skin thickening. There is no evidence for well-formed drainable fluid collection. IMPRESSION: 1. There is fat stranding in the right axillary region with mild overlying skin thickening. There is no evidence for well-formed drainable fluid collection. Findings are consistent with a soft tissue infection in the appropriate clinical setting. 2. Coronary artery disease. Aortic Atherosclerosis (ICD10-I70.0).  Electronically Signed   By: Katherine Mantle M.D.   On: 05/04/2019 18:16        Scheduled Meds: . benzonatate  200 mg Oral QHS  . carvedilol  3.125 mg Oral BID WC  . losartan  50 mg Oral Daily  . mometasone-formoterol  2 puff Inhalation BID  . pantoprazole  40 mg Oral Daily  . pneumococcal 23 valent vaccine  0.5 mL Intramuscular Tomorrow-1000  . rosuvastatin  40 mg Oral Daily  . sodium chloride flush  3 mL Intravenous Q12H   Continuous Infusions: . sodium chloride    . sodium chloride Stopped (05/06/19 0618)  .  ceFAZolin (ANCEF) IV 1 g (05/06/19 0622)  . vancomycin Stopped (05/05/19 1944)     LOS: 0 days    Time spent:  30 mins    Charise Killian, MD [facility] Pager [phone]  If 7PM-7AM, please contact night-coverage [email or web address] Password [facility] 05/06/2019, 7:55 AM

## 2019-05-06 NOTE — Progress Notes (Signed)
Pharmacy Antibiotic Note  [name] is a 64 y.o. female admitted on 05/04/2019 with cellulitis.  Pharmacy has been consulted for vancomycin dosing.  1/5- Levaquin changed to Cefazolin on 1/4.  Per ID note: evolving abscess which will need I/D. Likely organism Staph/strep  Plan: Vancomycin 750 mg IV Q 24 hrs. Goal AUC 400-550. Expected AUC: 445.7 SCr used: 1.14 Cssmin: 11.6  1/5- Scr improved 1.14 > 0.66.   Will adjust Vancomycin to 1250 mg IV q24h. Goal AUC 400-550. Expected AUC: 548 SCr used: 0.8 Cmin 12.0  Will continue to monitor and adjust as needed.    Height: 5\' 4"  (162.6 cm) Weight: 183 lb 13.8 oz (83.4 kg) IBW/kg (Calculated) : 54.7  Temp (24hrs), Avg:97.7 F (36.5 C), Min:97.6 F (36.4 C), Max:98 F (36.7 C)  Recent Labs  Lab 05/04/19 1558 05/05/19 0402 05/06/19 0601  WBC 12.9* 11.0* 8.7  CREATININE 1.14* 1.01* 0.66  LATICACIDVEN 1.2  --   --     Estimated Creatinine Clearance: 73.3 mL/min (by C-G formula based on SCr of 0.66 mg/dL).    Allergies  Allergen Reactions  . Penicillins Rash    Has patient had a PCN reaction causing immediate rash, facial/tongue/throat swelling, SOB or lightheadedness with hypotension: Yes Has patient had a PCN reaction causing severe rash involving mucus membranes or skin necrosis: No Has patient had a PCN reaction that required hospitalization No Has patient had a PCN reaction occurring within the last 10 years: No If all of the above answers are "NO", then may proceed with Cephalosporin use.    Thank you for allowing pharmacy to be a part of this patient's care.  07/04/19 PharmD Clinical Pharmacist 05/06/2019'

## 2019-05-06 NOTE — Anesthesia Preprocedure Evaluation (Signed)
Anesthesia Evaluation  Patient identified by MRN, date of birth, ID band Patient awake    Reviewed: Allergy & Precautions, H&P , NPO status , Patient's Chart, lab work & pertinent test results, reviewed documented beta blocker date and time   History of Anesthesia Complications Negative for: history of anesthetic complications  Airway Mallampati: III  TM Distance: >3 FB Neck ROM: full    Dental  (+) Edentulous Upper, Edentulous Lower, Dental Advidsory Given   Pulmonary shortness of breath and with exertion, neg sleep apnea, COPD,  COPD inhaler, neg recent URI, Current Smoker and Patient abstained from smoking.,    Pulmonary exam normal        Cardiovascular Exercise Tolerance: Good hypertension, (-) angina+ CAD, + Past MI and + Cardiac Stents  (-) CABG Normal cardiovascular exam+ dysrhythmias + Valvular Problems/Murmurs      Neuro/Psych PSYCHIATRIC DISORDERS Anxiety Depression negative neurological ROS     GI/Hepatic Neg liver ROS, GERD  ,  Endo/Other  negative endocrine ROS  Renal/GU negative Renal ROS  negative genitourinary   Musculoskeletal   Abdominal   Peds  Hematology negative hematology ROS (+)   Anesthesia Other Findings Past Medical History: No date: CAD (coronary artery disease)     Comment:  a. NSTEMI 10/03/15; b. cardiac cath 10/04/15: LM nl,               ost-pLAD 90% s/p PCI/DES, mLAD 80% s/p PCI/DES, mRCA 30%,              D1,2,3 nl, p-mLCX 30%, OM1 &2 min irregs, OM3 normal, EF               30-35%, sev HK of mid-distal ant, apical, inf wall, mod               elevated LVEDP No date: Chronic bronchitis (HCC) No date: Chronic cough No date: COPD (chronic obstructive pulmonary disease) (HCC) No date: Dysrhythmia No date: GERD (gastroesophageal reflux disease) No date: Heart murmur No date: HLD (hyperlipidemia) No date: Ischemic cardiomyopathy     Comment:  a. echo 10/05/15: EF 50%, mild HK of  anteroseptal and               anterior wall, nl LV diastolic fxn 09/2015: Myocardial infarction (HCC) No date: Status post primary angioplasty with coronary stent No date: Tobacco abuse No date: Wears dentures     Comment:  full upper and lower   Reproductive/Obstetrics negative OB ROS                             Anesthesia Physical Anesthesia Plan  ASA: III  Anesthesia Plan: General   Post-op Pain Management:    Induction: Intravenous  PONV Risk Score and Plan: 2 and Ondansetron, Dexamethasone and Treatment may vary due to age or medical condition  Airway Management Planned: LMA and Oral ETT  Additional Equipment:   Intra-op Plan:   Post-operative Plan: Extubation in OR  Informed Consent: I have reviewed the patients History and Physical, chart, labs and discussed the procedure including the risks, benefits and alternatives for the proposed anesthesia with the patient or authorized representative who has indicated his/her understanding and acceptance.     Dental Advisory Given  Plan Discussed with: Anesthesiologist, CRNA and Surgeon  Anesthesia Plan Comments:         Anesthesia Quick Evaluation

## 2019-05-06 NOTE — Consult Note (Signed)
Subjective:   CC: Right axillary cellulitis  HPI:  [name] is a 64 y.o. female who was consulted by [facility] for evaluation of above.  First noted several days ago.  Symptoms include: Pain is sharp, localized to the area.  Exacerbated by palpation.  Alleviated by nothing specific.  Associated with redness and swelling in the area.     Past Medical History:  has a past medical history of CAD (coronary artery disease), Chronic bronchitis ([place]), Chronic cough, COPD (chronic obstructive pulmonary disease) ([place]), Dysrhythmia, GERD (gastroesophageal reflux disease), Heart murmur, HLD (hyperlipidemia), Ischemic cardiomyopathy, Myocardial infarction ([place]) (09/2015), Status post primary angioplasty with coronary stent, Tobacco abuse, and Wears dentures.  Past Surgical History:  has a past surgical history that includes Cardiac catheterization (N/A, 10/04/2015); Cardiac catheterization (N/A, 10/04/2015); Cataract extraction w/PHACO (Right, 12/18/2018); and Cataract extraction w/PHACO (Left, 01/08/2019).  Family History: family history includes CAD in her mother; Colon cancer in her mother.  Social History:  reports that she has been smoking cigarettes. She has a 21.00 pack-year smoking history. She has never used smokeless tobacco. She reports current alcohol use of about 5.0 standard drinks of alcohol per week. She reports that she does not use drugs.  Current Medications:  Medications Prior to Admission  Medication Sig Dispense Refill  . acetaminophen (TYLENOL) 325 MG tablet Take 650 mg by mouth every 6 (six) hours as needed.    [name] aspirin 81 MG chewable tablet Chew 1 tablet (81 mg total) by mouth daily.    . carvedilol (COREG) 3.125 MG tablet TAKE 1 TABLET BY MOUTH TWICE A DAY 180 tablet 3  . losartan (COZAAR) 50 MG tablet Take 1 tablet (50 mg total) by mouth daily. 90 tablet 1  . omeprazole (PRILOSEC) 20 MG capsule TAKE 1 CAPSULE BY MOUTH TWICE DAILY BEFORE MEALS 180 capsule 3  . PRALUENT 75  MG/ML SOAJ INJECT 75 MG INTO THE SKIN EVERY 14 DAYSAS DIRECTED 2 mL 3  . rosuvastatin (CRESTOR) 40 MG tablet TAKE 1 TABLET BY MOUTH ONCE A DAY 90 tablet 1  . sulfamethoxazole-trimethoprim (BACTRIM DS) 800-160 MG tablet Take 1 tablet by mouth 2 (two) times daily. 20 tablet 0  . SYMBICORT 160-4.5 MCG/ACT inhaler INHALE 1 PUFF INTO THE LUNGS 2 TIMES DAILY. RINSE MOUTH AFTER USE. 10.2 g 0  . traMADol (ULTRAM) 50 MG tablet Take 1 tablet (50 mg total) by mouth every 12 (twelve) hours as needed for up to 5 days. 10 tablet 0  . albuterol (VENTOLIN HFA) 108 (90 Base) MCG/ACT inhaler INHALE 2 PUFFS INTO THE LUNGS EVERY 6 HOURS AS NEEDED FOR WHEEZING OR SHORTNESS OF BREATH. 8.5 g 0    Allergies:  Allergies  Allergen Reactions  . Penicillins Rash    Has patient had a PCN reaction causing immediate rash, facial/tongue/throat swelling, SOB or lightheadedness with hypotension: Yes Has patient had a PCN reaction causing severe rash involving mucus membranes or skin necrosis: No Has patient had a PCN reaction that required hospitalization No Has patient had a PCN reaction occurring within the last 10 years: No If all of the above answers are "NO", then may proceed with Cephalosporin use.    ROS:  General: Denies weight loss, weight gain, fatigue, fevers, chills, and night sweats. Eyes: Denies blurry vision, double vision, eye pain, itchy eyes, and tearing. Ears: Denies hearing loss, earache, and ringing in ears. Nose: Denies sinus pain, congestion, infections, runny nose, and nosebleeds. Mouth/throat: Denies hoarseness, sore throat, bleeding gums, and difficulty swallowing. Heart: Denies  chest pain, palpitations, racing heart, irregular heartbeat, leg pain or swelling, and decreased activity tolerance. Respiratory: Denies breathing difficulty, shortness of breath, wheezing, cough, and sputum. GI: Denies change in appetite, heartburn, nausea, vomiting, constipation, diarrhea, and blood in stool. GU: Denies  difficulty urinating, pain with urinating, urgency, frequency, blood in urine. Musculoskeletal: Denies joint stiffness, pain, swelling, muscle weakness. Skin: Denies rash, itching, mass, tumors, sores, and boils Neurologic: Denies headache, fainting, dizziness, seizures, numbness, and tingling. Psychiatric: Denies depression, anxiety, difficulty sleeping, and memory loss. Endocrine: Denies heat or cold intolerance, and increased thirst or urination. Blood/lymph: Denies easy bruising, easy bruising, and swollen glands     Objective:     BP 140/86 (BP Location: Left Arm)   Pulse 76   Temp 98 F (36.7 C) (Oral)   Resp 18   Ht 5\' 11"  (1.803 m) Comment: per patient   Wt 114.3 kg Comment: weight flucuates slightly with bed pharmacist aware   SpO2 98%   BMI 35.15 kg/m   Constitutional :  alert, cooperative, appears stated age and no distress  Lymphatics/Throat:  no asymmetry, masses, or scars  Respiratory:  clear to auscultation bilaterally  Cardiovascular:  regular rate and rhythm  Gastrointestinal: soft, non-tender; bowel sounds normal; no masses,  no organomegaly.  Musculoskeletal: Steady gait and movement  Skin:  Right axillary region with increased erythema and induration considered stent with an abscess center of the erythema where it is most tender and has palpable fluctuance.  No Obvious discharge yet.  Psychiatric: Normal affect, non-agitated, not confused       LABS:  CMP Latest Ref Rng & Units 05/06/2019 05/05/2019 05/04/2019  Glucose 70 - 99 mg/dL 93 07/02/2019) 431(V)  BUN 8 - 23 mg/dL 11 22 22   Creatinine 0.44 - 1.00 mg/dL 400(Q ) 6.76)  Sodium 135 - 145 mmol/L 134(L) 130(L) 133(L)  Potassium 3.5 - 5.1 mmol/L 3.9 3.7 3.9  Chloride 98 - 111 mmol/L 99 96(L) 96(L)  CO2 22 - 32 mmol/L 24 23 25   Calcium 8.9 - 10.3 mg/dL 8.9 1.95(K) 9.2  Total Protein 6.5 - 8.1 g/dL - 7.0 8.0  Total Bilirubin 0.3 - 1.2 mg/dL - 0.4 0.6  Alkaline Phos 38 - 126 U/L - 72 83  AST 15 - 41 U/L -  23 29  ALT 0 - 44 U/L - 19 20   CBC Latest Ref Rng & Units 05/06/2019 05/05/2019 05/04/2019  WBC 4.0 - 10.5 K/uL 8.7 11.0(H) 12.9(H)  Hemoglobin 12.0 - 15.0 g/dL 11.4(L) 11.1(L) 12.0  Hematocrit 36.0 - 46.0 % 34.5(L) 32.0(L) 37.5  Platelets 150 - 400 K/uL 256 236 251    RADS: CLINICAL DATA:  Cellulitis of the axilla with concern for abscess.  EXAM: CT CHEST WITH CONTRAST  TECHNIQUE: Multidetector CT imaging of the chest was performed during intravenous contrast administration.  CONTRAST:  22mL OMNIPAQUE IOHEXOL 300 MG/ML  SOLN  COMPARISON:  None.  FINDINGS: Cardiovascular: Aortic calcifications are noted without evidence for dissection or aneurysm. The heart size is normal. Coronary artery calcifications are noted. There is no large centrally located pulmonary embolism.  Mediastinum/Nodes:  --No mediastinal or hilar lymphadenopathy.  --there is mild right axillary adenopathy, presumably reactive.  --No supraclavicular lymphadenopathy.  --Normal thyroid gland.  --the esophagus is fluid-filled to the level of the mid thorax.  Lungs/Pleura: No pulmonary nodules or masses. No pleural effusion or pneumothorax. No focal airspace consolidation. No focal pleural abnormality.  Upper Abdomen: No acute abnormality.  Musculoskeletal: There is fat stranding in  the right axillary region with mild overlying skin thickening. There is no evidence for well-formed drainable fluid collection.  IMPRESSION: 1. There is fat stranding in the right axillary region with mild overlying skin thickening. There is no evidence for well-formed drainable fluid collection. Findings are consistent with a soft tissue infection in the appropriate clinical setting. 2. Coronary artery disease.  Aortic Atherosclerosis (ICD10-I70.0).   Electronically Signed   By: Katherine Mantle M.D.   On: 05/04/2019 18:16  Assessment:      Right axillary abscess  Plan:     1.  CT scan  does not show obvious fluid collection but on clinical exam today, there is palpable area of fluctuance with very focal tenderness consistent with likely an abscess formation by now.  Recommended incision and drainage in the operating room secondary to location and degree of discomfort.   Alternatives include continued observation and IV abx.  Benefits include possible symptom relief, pathologic evaluation. Discussed the risk of surgery including recurrence, chronic pain,  poor cosmesis, poor/delayed wound healing, and possible re-operation to address said risks. The risks of general anesthetic, if used, includes MI, CVA, sudden death or even reaction to anesthetic medications also discussed.  Typical post-op recovery time of daily packing changes until the wound heals over several weeks also discussed.  The patient verbalized understanding and all questions were answered to the patient's satisfaction.  She wishes to proceed with I&D.  To the OR later today.

## 2019-05-06 NOTE — Anesthesia Procedure Notes (Signed)
Procedure Name: Intubation Date/Time: 05/06/2019 4:16 PM Performed by: Rosanne Gutting, CRNA Pre-anesthesia Checklist: Patient identified, Patient being monitored, Timeout performed, Emergency Drugs available and Suction available Patient Re-evaluated:Patient Re-evaluated prior to induction Oxygen Delivery Method: Circle system utilized Preoxygenation: Pre-oxygenation with 100% oxygen Induction Type: IV induction Ventilation: Mask ventilation without difficulty Laryngoscope Size: 3 and McGraph Grade View: Grade I Tube type: Oral Tube size: 7.0 mm Number of attempts: 1 Airway Equipment and Method: Stylet Placement Confirmation: ETT inserted through vocal cords under direct vision,  positive ETCO2 and breath sounds checked- equal and bilateral Secured at: 21 cm Tube secured with: Tape Dental Injury: Teeth and Oropharynx as per pre-operative assessment

## 2019-05-06 NOTE — TOC Initial Note (Signed)
Transition of Care [facility]) - Initial/Assessment Note    Patient Details  Name: [name] MRN: [id number] Date of Birth: Oct 27, 1955  Transition [facility] CM/SW Contact:    Allayne Butcher, RN Phone Number: 05/06/2019, 11:35 AM  Clinical Narrative:                 Patient admitted with cellulitis of the right chest.  Patient is from home and lives with her son [name]  Patient reports she wants to get out of here and get home.  Surgery has been consulted for possible I&D.  Patient reports she is independent at home and drives.  Patient goes to see Dr. Kirke Corin every 6 months.   RNCM will cont to follow and assist with any discharge needs.   Expected Discharge Plan: Home/Self Care Barriers to Discharge: Continued Medical Work up   Patient Goals and CMS Choice Patient states their goals for this hospitalization and ongoing recovery are:: to go home      Expected Discharge Plan and Services Expected Discharge Plan: Home/Self Care       Living arrangements for the past 2 months: Single Family Home                                      Prior Living Arrangements/Services Living arrangements for the past 2 months: Single Family Home Lives with:: Adult Children(Son [name]) Patient language and need for interpreter reviewed:: Yes Do you feel safe going back to the place where you live?: Yes      Need for Family Participation in Patient Care: Yes (Comment)(cellulitis, COPD) Care giver support system in place?: Yes (comment)(son [name])   Criminal Activity/Legal Involvement Pertinent to Current Situation/Hospitalization: No - Comment as needed  Activities of Daily Living Home Assistive Devices/Equipment: None ADL Screening (condition at time of admission) Patient's cognitive ability adequate to safely complete daily activities?: Yes Is the patient deaf or have difficulty hearing?: No Does the patient have difficulty seeing, even when wearing glasses/contacts?: No Does  the patient have difficulty concentrating, remembering, or making decisions?: No Patient able to express need for assistance with ADLs?: Yes Does the patient have difficulty dressing or bathing?: No Independently performs ADLs?: Yes (appropriate for developmental age) Does the patient have difficulty walking or climbing stairs?: No Weakness of Legs: None Weakness of Arms/Hands: None  Permission Sought/Granted Permission sought to share information with : Case Manager, Family Supports Permission granted to share information with : Yes, Verbal Permission Granted        Permission granted to share info w Relationship: Son [place]     Emotional Assessment Appearance:: Appears stated age Attitude/Demeanor/Rapport: Complaining Affect (typically observed): Agitated, Restless Orientation: : Oriented to Self, Oriented to Place, Oriented to  Time, Oriented to Situation Alcohol / Substance Use: Tobacco Use Psych Involvement: No (comment)  Admission diagnosis:  Cellulitis [L03.90] Cellulitis of axillary region [L03.119] Cellulitis of right breast [N61.0] Patient Active Problem List   Diagnosis Date Noted  . Cellulitis of right breast   . Cellulitis 05/04/2019  . Hyponatremia 05/04/2019  . Moderate protein-calorie malnutrition (HCC) 05/04/2019  . Chronic cough 02/09/2016  . Trochanteric bursitis, right hip 02/09/2016  . Status post insertion of drug-eluting stent into left anterior descending (LAD) artery 10/26/2015  . Anxiety and depression 10/26/2015  . CAD (coronary artery disease)   . Ischemic cardiomyopathy   . HLD (hyperlipidemia)   . Tobacco abuse   .  NSTEMI (non-ST elevated myocardial infarction) ([place]) 10/03/2015   PCP:  Patient, No Pcp Per Pharmacy:   [facility] [place] [facility] [place] [address] [place] [address] Phone: [phone] Fax: [phone]     Social Determinants of Health (SDOH) Interventions    Readmission Risk  Interventions No flowsheet data found.

## 2019-05-06 NOTE — Anesthesia Postprocedure Evaluation (Signed)
Anesthesia Post Note  Patient: Scientist, product/process development  Procedure(s) Performed: INCISION AND DRAINAGE ABSCESS, RIGHT (Right )  Patient location during evaluation: PACU Anesthesia Type: General Level of consciousness: awake and alert Pain management: pain level controlled Vital Signs Assessment: post-procedure vital signs reviewed and stable Respiratory status: spontaneous breathing, nonlabored ventilation, respiratory function stable and patient connected to nasal cannula oxygen Cardiovascular status: blood pressure returned to baseline and stable Postop Assessment: no apparent nausea or vomiting Anesthetic complications: no     Last Vitals:  Vitals:   05/06/19 1735 05/06/19 1750  BP: (!) 100/55 106/71  Pulse: 73 70  Resp: 18 16  Temp:  36.7 C  SpO2: 94% 98%    Last Pain:  Vitals:   05/06/19 1750  TempSrc:   PainSc: 0-No pain                 Cleda Mccreedy Janie Capp

## 2019-05-06 NOTE — Transfer of Care (Signed)
Immediate Anesthesia Transfer of Care Note  Patient: Scientist, product/process development  Procedure(s) Performed: INCISION AND DRAINAGE ABSCESS, RIGHT (Right )  Patient Location: PACU  Anesthesia Type:General  Level of Consciousness: sedated  Airway & Oxygen Therapy: Patient Spontanous Breathing and Patient connected to face mask oxygen  Post-op Assessment: Report given to RN and Post -op Vital signs reviewed and stable  Post vital signs: Reviewed and stable  Last Vitals:  Vitals Value Taken Time  BP 94/57 05/06/19 1722  Temp    Pulse 80 05/06/19 1721  Resp 17 05/06/19 1723  SpO2 100 % 05/06/19 1721  Vitals shown include unvalidated device data.  Last Pain:  Vitals:   05/06/19 1720  TempSrc:   PainSc: (P) Asleep      Patients Stated Pain Goal: 0 (05/05/19 2234)  Complications: No apparent anesthesia complications

## 2019-05-07 DIAGNOSIS — L02419 Cutaneous abscess of limb, unspecified: Secondary | ICD-10-CM

## 2019-05-07 DIAGNOSIS — R739 Hyperglycemia, unspecified: Secondary | ICD-10-CM

## 2019-05-07 LAB — CBC
HCT: 33.6 % — ABNORMAL LOW (ref 36.0–46.0)
Hemoglobin: 11.1 g/dL — ABNORMAL LOW (ref 12.0–15.0)
MCH: 29.4 pg (ref 26.0–34.0)
MCHC: 33 g/dL (ref 30.0–36.0)
MCV: 88.9 fL (ref 80.0–100.0)
Platelets: 283 10*3/uL (ref 150–400)
RBC: 3.78 MIL/uL — ABNORMAL LOW (ref 3.87–5.11)
RDW: 13.8 % (ref 11.5–15.5)
WBC: 8.4 10*3/uL (ref 4.0–10.5)
nRBC: 0 % (ref 0.0–0.2)

## 2019-05-07 LAB — BASIC METABOLIC PANEL
Anion gap: 10 (ref 5–15)
BUN: 14 mg/dL (ref 8–23)
CO2: 24 mmol/L (ref 22–32)
Calcium: 8.7 mg/dL — ABNORMAL LOW (ref 8.9–10.3)
Chloride: 100 mmol/L (ref 98–111)
Creatinine, Ser: 0.67 mg/dL (ref 0.44–1.00)
GFR calc Af Amer: >60 mL/min (ref >60)
GFR calc non Af Amer: >60 mL/min (ref >60)
Glucose, Bld: 227 mg/dL — ABNORMAL HIGH (ref 70–99)
Potassium: 4 mmol/L (ref 3.5–5.1)
Sodium: 134 mmol/L — ABNORMAL LOW (ref 135–145)

## 2019-05-07 LAB — HEMOGLOBIN A1C
Hgb A1c MFr Bld: 5.6 % (ref 4.8–5.6)
Mean Plasma Glucose: 114.02 mg/dL

## 2019-05-07 MED ORDER — SODIUM CHLORIDE 0.9 % IV SOLN: INTRAVENOUS | Status: DC

## 2019-05-07 MED ORDER — POLYETHYLENE GLYCOL 3350 17 G PO PACK
17.0000 g | PACK | Freq: Every day | ORAL | Status: DC | PRN
  Administered 2019-05-07: 17 g via ORAL
  Filled 2019-05-07 (×2): qty 1

## 2019-05-07 NOTE — H&P (View-Only) (Signed)
Subjective:  CC: [name] is a 63 y.o. female  Hospital stay day 1, 1 Day Post-Op I&D right axilla abscess  HPI: No acute issues overnight.  Pain has much improved.  ROS:  General: Denies weight loss, weight gain, fatigue, fevers, chills, and night sweats. Heart: Denies chest pain, palpitations, racing heart, irregular heartbeat, leg pain or swelling, and decreased activity tolerance. Respiratory: Denies breathing difficulty, shortness of breath, wheezing, cough, and sputum. GI: Denies change in appetite, heartburn, nausea, vomiting, constipation, diarrhea, and blood in stool. GU: Denies difficulty urinating, pain with urinating, urgency, frequency, blood in urine.   Objective:   Temp:  [97.6 F (36.4 C)-98.6 F (37 C)] 98.1 F (36.7 C) (01/06 0804) Pulse Rate:  [66-86] 66 (01/06 0804) Resp:  [14-18] 17 (01/06 0804) BP: (94-139)/(55-85) 114/71 (01/06 0804) SpO2:  [94 %-100 %] 95 % (01/06 0804)     Height: 5' 11" (180.3 cm)(per patient ) Weight: 114.3 kg(weight flucuates slightly with bed pharmacist aware ) BMI (Calculated): 35.16   Intake/Output this shift:   Intake/Output Summary (Last 24 hours) at 05/07/2019 1114 Last data filed at 05/07/2019 0900 Gross per 24 hour  Intake 1117.9 ml  Output 60 ml  Net 1057.9 ml    Constitutional :  alert, cooperative, appears stated age and no distress  Respiratory:  clear to auscultation bilaterally  Cardiovascular:  regular rate and rhythm  Skin: Cool and moist.  Right axillary dressing intact with serosanguineous drainage as expected.  Psychiatric: Normal affect, non-agitated, not confused       LABS:  CMP Latest Ref Rng & Units 05/07/2019 05/06/2019 05/05/2019  Glucose 70 - 99 mg/dL 227(H) 93 117(H)  BUN 8 - 23 mg/dL 14 11 22  Creatinine 0.44 - 1.00 mg/dL 0.67 0.66 1.01(H)  Sodium 135 - 145 mmol/L 134(L) 134(L) 130(L)  Potassium 3.5 - 5.1 mmol/L 4.0 3.9 3.7  Chloride 98 - 111 mmol/L 100 99 96(L)  CO2 22 - 32 mmol/L 24 24 23   Calcium 8.9 - 10.3 mg/dL 8.7(L) 8.9 8.8(L)  Total Protein 6.5 - 8.1 g/dL - - 7.0  Total Bilirubin 0.3 - 1.2 mg/dL - - 0.4  Alkaline Phos 38 - 126 U/L - - 72  AST 15 - 41 U/L - - 23  ALT 0 - 44 U/L - - 19   CBC Latest Ref Rng & Units 05/07/2019 05/06/2019 05/05/2019  WBC 4.0 - 10.5 K/uL 8.4 8.7 11.0(H)  Hemoglobin 12.0 - 15.0 g/dL 11.1(L) 11.4(L) 11.1(L)  Hematocrit 36.0 - 46.0 % 33.6(L) 34.5(L) 32.0(L)  Platelets 150 - 400 K/uL 283 256 236    RADS: n/a Assessment:   S/p I&D right axilla abscess.  Due to the amount of bleeding and size of the abscess encountered during the initial I&D, we will proceed with her initial dressing change tomorrow back in the operating room.  Plan is if dressing change goes well with no signs of additional bleeding, will be able to go home after procedure with home health for wet-to-dry dressing changes.  abx management per ID. 

## 2019-05-07 NOTE — Progress Notes (Signed)
PROGRESS NOTE                                                                                                                                                                                                             Patient Demographics:    [name], is a 64 y.o. female, DOB - October 25, 1955, GYI:[id number]  Admit date - 05/04/2019   Admitting Physician Charise Killian, MD  Outpatient Primary MD for the patient is Iran Ouch, MD  LOS - 1    Chief Complaint  Patient presents with  . Abscess       Brief Narrative 65 year old obese female with history of coronary artery disease, hyperlipidemia presented with cellulitis of the right axilla and breast with evolving abscess.  Seen by surgery and underwent I&D in the OR.   Subjective:   Rt axillary pain much better after I&D yesterday. No fever   Assessment  & Plan :    Principal Problem:   Cellulitis with abscess of the right breast and axilla Underwent I&D of the abscess on 1/5.  Given the size of the abscess and bleeding during the initial I&D surgery plans on dressing change tomorrow back in the OR.  If no significant bleeding and dressing appears clean, possibly discharge home with home health for wet-to-dry dressing.  Few staph aureus on wound culture.  Follow sensitivity.  Blood culture negative for growth.  Empiric IV vancomycin and cefazolin.  Outpatient antibiotic duration and recommendation per ID.  Active Problems: Hyponatremia Likely prerenal.  Improved with fluids  Moderate protein calorie malnutrition Continue prostat  Acute kidney injury Possibly prerenal.  Resolved with fluids   Coronary artery disease Stable.  Continue aspirin, Coreg and statin.  COPD Stable.  Continue bronchodilators  Hyperglycemia No history of diabetes mellitus.  CBG in 200s.  Check A1c.  Obesity (BMI 35.15 kg/m) Counseled on diet and exercise to lose  weight.  Patient is motivated     Code Status : Full code  Family Communication  : None at bedside  Disposition Plan  : Home possibly tomorrow if wound appears improving and pending final culture and sensitivity  Barriers For Discharge : Active symptoms  Consults  : Surgery, ID  Procedures  : CT axilla, I&D of the abscess  DVT Prophylaxis  :  Lovenox -  Lab Results  Component Value Date   PLT 283 05/07/2019    Antibiotics  :    Anti-infectives (From admission, onward)   Start     Dose/Rate Route Frequency Ordered Stop   05/06/19 1600  vancomycin (VANCOREADY) IVPB 1250 mg/250 mL     1,250 mg 166.7 mL/hr over 90 Minutes Intravenous Every 24 hours 05/06/19 0925     05/05/19 2200  ceFAZolin (ANCEF) IVPB 1 g/50 mL premix     1 g 100 mL/hr over 30 Minutes Intravenous Every 8 hours 05/05/19 1839     05/05/19 1700  vancomycin (VANCOREADY) IVPB 750 mg/150 mL  Status:  Discontinued     750 mg 150 mL/hr over 60 Minutes Intravenous Every 24 hours 05/05/19 0203 05/06/19 0925   05/05/19 0215  levofloxacin (LEVAQUIN) IVPB 750 mg  Status:  Discontinued     750 mg 100 mL/hr over 90 Minutes Intravenous Every 24 hours 05/05/19 0201 05/05/19 1839   05/04/19 1630  vancomycin (VANCOREADY) IVPB 1750 mg/350 mL     1,750 mg 175 mL/hr over 120 Minutes Intravenous  Once 05/04/19 1629 05/04/19 1904        Objective:   Vitals:   05/06/19 2121 05/06/19 2158 05/07/19 0616 05/07/19 0804  BP: 100/70 116/75 124/85 114/71  Pulse: 86 83 70 66  Resp: 16 16 16 17   Temp: 98.3 F (36.8 C) 98 F (36.7 C) 97.6 F (36.4 C) 98.1 F (36.7 C)  TempSrc:   Oral Oral  SpO2: 96% 96% 94% 95%  Weight:      Height:        Wt Readings from Last 3 Encounters:  05/06/19 114.3 kg  05/01/19 84.8 kg  03/11/19 83.2 kg     Intake/Output Summary (Last 24 hours) at 05/07/2019 0946 Last data filed at 05/06/2019 2200 Gross per 24 hour  Intake 1117.9 ml  Output 60 ml  Net 1057.9 ml     Physical  Exam  Gen: elderly obese female not in distress HEENT: moist mucosa, supple neck Chest: clear b/l, no added sounds, dressing over rt axilla, erythema extending to rt lateral breast. CVS: N S1&S2, no murmurs, GI: soft, NT, ND,  Musculoskeletal: warm, no edema     Data Review:    CBC Recent Labs  Lab 05/04/19 1558 05/05/19 0402 05/06/19 0601 05/07/19 0358  WBC 12.9* 11.0* 8.7 8.4  HGB 12.0 11.1* 11.4* 11.1*  HCT 37.5 32.0* 34.5* 33.6*  PLT 251 236 256 283  MCV 90.1 84.9 88.9 88.9  MCH 28.8 29.4 29.4 29.4  MCHC 32.0 34.7 33.0 33.0  RDW 13.4 13.6 13.7 13.8  LYMPHSABS 1.2  --   --   --   MONOABS 0.7  --   --   --   EOSABS 0.3  --   --   --   BASOSABS 0.1  --   --   --     Chemistries  Recent Labs  Lab 05/04/19 1558 05/05/19 0402 05/06/19 0601 05/07/19 0358  NA 133* 130* 134* 134*  K 3.9 3.7 3.9 4.0  CL 96* 96* 99 100  CO2 25 23 24 24   GLUCOSE 114* 117* 93 227*  BUN 22 22 11 14   CREATININE 1.14* 1.01* 0.66 0.67  CALCIUM 9.2 8.8* 8.9 8.7*  AST 29 23  --   --   ALT 20 19  --   --   ALKPHOS 83 72  --   --   BILITOT 0.6 0.4  --   --    ------------------------------------------------------------------------------------------------------------------  No results for input(s): CHOL, HDL, LDLCALC, TRIG, CHOLHDL, LDLDIRECT in the last 72 hours.  Lab Results  Component Value Date   HGBA1C 5.4 10/03/2015   ------------------------------------------------------------------------------------------------------------------ No results for input(s): TSH, T4TOTAL, T3FREE, THYROIDAB in the last 72 hours.  Invalid input(s): FREET3 ------------------------------------------------------------------------------------------------------------------ No results for input(s): VITAMINB12, FOLATE, FERRITIN, TIBC, IRON, RETICCTPCT in the last 72 hours.  Coagulation profile No results for input(s): INR, PROTIME in the last 168 hours.  No results for input(s): DDIMER in the last 72  hours.  Cardiac Enzymes No results for input(s): CKMB, TROPONINI, MYOGLOBIN in the last 168 hours.  Invalid input(s): CK ------------------------------------------------------------------------------------------------------------------ No results found for: BNP  Inpatient Medications  Scheduled Meds: . benzonatate  200 mg Oral QHS  . carvedilol  3.125 mg Oral BID WC  . losartan  50 mg Oral Daily  . mometasone-formoterol  2 puff Inhalation BID  . pantoprazole  40 mg Oral Daily  . pneumococcal 23 valent vaccine  0.5 mL Intramuscular Tomorrow-1000  . rosuvastatin  40 mg Oral Daily  . sodium chloride flush  3 mL Intravenous Q12H   Continuous Infusions: . sodium chloride    . sodium chloride 75 mL/hr at 05/07/19 0414  .  ceFAZolin (ANCEF) IV 1 g (05/07/19 0649)  . vancomycin     PRN Meds:.sodium chloride, acetaminophen **OR** acetaminophen, albuterol, hydrOXYzine, morphine injection, morphine injection, sodium chloride flush, traMADol  Micro Results Recent Results (from the past 240 hour(s))  Culture, blood (routine x 2)     Status: None (Preliminary result)   Collection Time: 05/04/19  3:58 PM   Specimen: BLOOD  Result Value Ref Range Status   Specimen Description BLOOD BLOOD LEFT WRIST  Final   Special Requests   Final    BOTTLES DRAWN AEROBIC AND ANAEROBIC Blood Culture results may not be optimal due to an inadequate volume of blood received in culture bottles   Culture   Final    NO GROWTH 3 DAYS Performed at [facility], [address], [place], [place] [address]    Report Status PENDING  Incomplete  Culture, blood (routine x 2)     Status: None (Preliminary result)   Collection Time: 05/04/19  3:58 PM   Specimen: BLOOD  Result Value Ref Range Status   Specimen Description BLOOD LEFT ANTECUBITAL  Final   Special Requests   Final    BOTTLES DRAWN AEROBIC AND ANAEROBIC Blood Culture results may not be optimal due to an excessive volume of blood received in  culture bottles   Culture   Final    NO GROWTH 3 DAYS Performed at [facility], [address], [place], [place] [address]    Report Status PENDING  Incomplete  Respiratory Panel by RT PCR (Flu A&B, Covid) - Nasopharyngeal Swab     Status: None   Collection Time: 05/04/19  4:31 PM   Specimen: Nasopharyngeal Swab  Result Value Ref Range Status   SARS Coronavirus 2 by RT PCR NEGATIVE NEGATIVE Final    Comment: (NOTE) SARS-CoV-2 target nucleic acids are NOT DETECTED. The SARS-CoV-2 RNA is generally detectable in upper respiratoy specimens during the acute phase of infection. The lowest concentration of SARS-CoV-2 viral copies this assay can detect is 131 copies/mL. A negative result does not preclude SARS-Cov-2 infection and should not be used as the sole basis for treatment or other patient management decisions. A negative result may occur with  improper specimen collection/handling, submission of specimen other than nasopharyngeal swab, presence of viral  mutation(s) within the areas targeted by this assay, and inadequate number of viral copies (<131 copies/mL). A negative result must be combined with clinical observations, patient history, and epidemiological information. The expected result is Negative. Fact Sheet for Patients:  https://www.moore.com/ Fact Sheet for Healthcare Providers:  https://www.young.biz/ This test is not yet ap proved or cleared by the [place] FDA and  has been authorized for detection and/or diagnosis of SARS-CoV-2 by FDA under an Emergency Use Authorization (EUA). This EUA will remain  in effect (meaning this test can be used) for the duration of the COVID-19 declaration under Section 564(b)(1) of the Act, 21 U.S.C. section 360bbb-3(b)(1), unless the authorization is terminated or revoked sooner.    Influenza A by PCR NEGATIVE NEGATIVE Final   Influenza B by PCR NEGATIVE NEGATIVE Final    Comment:  (NOTE) The Xpert Xpress SARS-CoV-2/FLU/RSV assay is intended as an aid in  the diagnosis of influenza from Nasopharyngeal swab specimens and  should not be used as a sole basis for treatment. Nasal washings and  aspirates are unacceptable for Xpert Xpress SARS-CoV-2/FLU/RSV  testing. Fact Sheet for Patients: https://www.moore.com/ Fact Sheet for Healthcare Providers: https://www.young.biz/ This test is not yet approved or cleared by the [place] FDA and  has been authorized for detection and/or diagnosis of SARS-CoV-2 by  FDA under an Emergency Use Authorization (EUA). This EUA will remain  in effect (meaning this test can be used) for the duration of the  Covid-19 declaration under Section 564(b)(1) of the Act, 21  U.S.C. section 360bbb-3(b)(1), unless the authorization is  terminated or revoked. Performed at [facility], [address] [place], [place] [address]   Aerobic/Anaerobic Culture (surgical/deep wound)     Status: None (Preliminary result)   Collection Time: 05/06/19  4:34 PM   Specimen: PATH Other; Tissue  Result Value Ref Range Status   Specimen Description   Final    ABSCESS RIGHT AXILLARY Performed at [facility], [address], [place], [place] [address]    Special Requests   Final    NONE Performed at [facility], [address] [place], [place] [address]    Gram Stain   Final    FEW WBC PRESENT, PREDOMINANTLY PMN RARE GRAM POSITIVE COCCI Performed at [facility], [address], [place], [place] [address]    Culture PENDING  Incomplete   Report Status PENDING  Incomplete    Radiology Reports CT Chest W Contrast  Result Date: 05/04/2019 CLINICAL DATA:  Cellulitis of the axilla with concern for abscess. EXAM: CT CHEST WITH CONTRAST TECHNIQUE: Multidetector CT imaging of the chest was performed during intravenous contrast administration. CONTRAST:  60mL OMNIPAQUE IOHEXOL 300  MG/ML  SOLN COMPARISON:  None. FINDINGS: Cardiovascular: Aortic calcifications are noted without evidence for dissection or aneurysm. The heart size is normal. Coronary artery calcifications are noted. There is no large centrally located pulmonary embolism. Mediastinum/Nodes: --No mediastinal or hilar lymphadenopathy. --there is mild right axillary adenopathy, presumably reactive. --No supraclavicular lymphadenopathy. --Normal thyroid gland. --the esophagus is fluid-filled to the level of the mid thorax. Lungs/Pleura: No pulmonary nodules or masses. No pleural effusion or pneumothorax. No focal airspace consolidation. No focal pleural abnormality. Upper Abdomen: No acute abnormality. Musculoskeletal: There is fat stranding in the right axillary region with mild overlying skin thickening. There is no evidence for well-formed drainable fluid collection. IMPRESSION: 1. There is fat stranding in the right axillary region with mild overlying skin thickening. There is no evidence for  well-formed drainable fluid collection. Findings are consistent with a soft tissue infection in the appropriate clinical setting. 2. Coronary artery disease. Aortic Atherosclerosis (ICD10-I70.0). Electronically Signed   By: Katherine Mantle M.D.   On: 05/04/2019 18:16    Time Spent in minutes 35   Rilla Buckman M.D on 05/07/2019 at 9:46 AM  Between 7am to 7pm - Pager - [phone]  After 7pm go to [email or web address] - password [email or web address]  [facility] -  Office  [phone]

## 2019-05-07 NOTE — Progress Notes (Signed)
ID Staph aureus from  abscess culture- Dont know whether it is MSSA or MRSA- will be available tomorrow. Currently on vanco and cefazolin- Will Dc cefazolin and tomorrow if it is MRSA she can go on Bactrim DS 1 BID for 10 days-14 days IF MSSA then Keflex 500mg  Po Q 6 for 7-10 days

## 2019-05-07 NOTE — Progress Notes (Signed)
Subjective:  CC: [name] is a 64 y.o. female  Hospital stay day 1, 1 Day Post-Op I&D right axilla abscess  HPI: No acute issues overnight.  Pain has much improved.  ROS:  General: Denies weight loss, weight gain, fatigue, fevers, chills, and night sweats. Heart: Denies chest pain, palpitations, racing heart, irregular heartbeat, leg pain or swelling, and decreased activity tolerance. Respiratory: Denies breathing difficulty, shortness of breath, wheezing, cough, and sputum. GI: Denies change in appetite, heartburn, nausea, vomiting, constipation, diarrhea, and blood in stool. GU: Denies difficulty urinating, pain with urinating, urgency, frequency, blood in urine.   Objective:   Temp:  [97.6 F (36.4 C)-98.6 F (37 C)] 98.1 F (36.7 C) (01/06 0804) Pulse Rate:  [66-86] 66 (01/06 0804) Resp:  [14-18] 17 (01/06 0804) BP: (94-139)/(55-85) 114/71 (01/06 0804) SpO2:  [94 %-100 %] 95 % (01/06 0804)     Height: 5\' 11"  (180.3 cm)(per patient ) Weight: 114.3 kg(weight flucuates slightly with bed pharmacist aware ) BMI (Calculated): 35.16   Intake/Output this shift:   Intake/Output Summary (Last 24 hours) at 05/07/2019 1114 Last data filed at 05/07/2019 0900 Gross per 24 hour  Intake 1117.9 ml  Output 60 ml  Net 1057.9 ml    Constitutional :  alert, cooperative, appears stated age and no distress  Respiratory:  clear to auscultation bilaterally  Cardiovascular:  regular rate and rhythm  Skin: Cool and moist.  Right axillary dressing intact with serosanguineous drainage as expected.  Psychiatric: Normal affect, non-agitated, not confused       LABS:  CMP Latest Ref Rng & Units 05/07/2019 05/06/2019 05/05/2019  Glucose 70 - 99 mg/dL 07/03/2019) 93 606(T)  BUN 8 - 23 mg/dL 14 11 22   Creatinine 0.44 - 1.00 mg/dL 016(W 1.09)  Sodium 135 - 145 mmol/L 134(L) 134(L) 130(L)  Potassium 3.5 - 5.1 mmol/L 4.0 3.9 3.7  Chloride 98 - 111 mmol/L 100 99 96(L)  CO2 22 - 32 mmol/L 24 24 23    Calcium 8.9 - 10.3 mg/dL 3.23) 8.9 5.57(D)  Total Protein 6.5 - 8.1 g/dL - - 7.0  Total Bilirubin 0.3 - 1.2 mg/dL - - 0.4  Alkaline Phos 38 - 126 U/L - - 72  AST 15 - 41 U/L - - 23  ALT 0 - 44 U/L - - 19   CBC Latest Ref Rng & Units 05/07/2019 05/06/2019 05/05/2019  WBC 4.0 - 10.5 K/uL 8.4 8.7 11.0(H)  Hemoglobin 12.0 - 15.0 g/dL 11.1(L) 11.4(L) 11.1(L)  Hematocrit 36.0 - 46.0 % 33.6(L) 34.5(L) 32.0(L)  Platelets 150 - 400 K/uL 283 256 236    RADS: n/a Assessment:   S/p I&D right axilla abscess.  Due to the amount of bleeding and size of the abscess encountered during the initial I&D, we will proceed with her initial dressing change tomorrow back in the operating room.  Plan is if dressing change goes well with no signs of additional bleeding, will be able to go home after procedure with home health for wet-to-dry dressing changes.  abx management per ID.

## 2019-05-07 NOTE — Op Note (Signed)
Preoperative diagnosis: Right axillary abscess Postoperative diagnosis: same  Procedure: Incision and drainage of right axillary abscess  Anesthesia: GETA  Surgeon: Tonna Boehringer  Wound Classification: Contaminated  Indications: Patient is a 64 y.o. female  presented with above.  See H&P for further details.  Specimen: Right axillary abscess wound culture  Complications: None  Estimated Blood Loss: 60 mL  Findings:  1.  Large right axillary abscess pocket extending into right breast 2. purulent secretions drained and cultured 3. Adequate hemostasis.   Description of procedure: The patient was placed in the supine position and GETA anesthesia was induced. The area was prepped and draped in the usual sterile fashion. A timeout was completed verifying correct patient, procedure, site, positioning, and implant(s) and/or special equipment prior to beginning this procedure.  Local infused over planned incision site.  Inicision made most indurated portion in the posterior axillary area and copious amounts of purulent secretions was drained. Cultures taken.  With a finger blunt dissection of septas performed to drain the abscess completely.  In the end, incision measured approximately 5 cm, and wound of itself measured approximately 10 cm x 8 cm tunneling towards the right breast.  Extensive hemostasis was required due to the acutely inflamed tissue, and multiple septations that needed to be removed.  Hemostasis achieved via combination of manual pressure bundle electrocautery, and use of Surgicel.  The wound then irrigated, hemostasis confirmed and then packed with Kerlix roll, dressed with abd pads and secured with paper tape.  The patient tolerated the procedure well and was taken to the postanesthesia care unit in satisfactory condition.  Sponge and instrument count correct at end of procedure.

## 2019-05-08 ENCOUNTER — Inpatient Hospital Stay: Payer: Medicare Other | Admitting: Registered Nurse

## 2019-05-08 ENCOUNTER — Encounter
Admission: EM | Disposition: A | Discharge status: Home / Self Care | Payer: Medicare Other | Source: Home / Self Care | Attending: Internal Medicine

## 2019-05-08 ENCOUNTER — Other Ambulatory Visit: Payer: Self-pay

## 2019-05-08 DIAGNOSIS — L0390 Cellulitis, unspecified: Secondary | ICD-10-CM

## 2019-05-08 DIAGNOSIS — N179 Acute kidney failure, unspecified: Secondary | ICD-10-CM

## 2019-05-08 DIAGNOSIS — L02419 Cutaneous abscess of limb, unspecified: Secondary | ICD-10-CM | POA: Diagnosis present

## 2019-05-08 DIAGNOSIS — L02213 Cutaneous abscess of chest wall: Secondary | ICD-10-CM

## 2019-05-08 DIAGNOSIS — N611 Abscess of the breast and nipple: Secondary | ICD-10-CM

## 2019-05-08 DIAGNOSIS — L03313 Cellulitis of chest wall: Secondary | ICD-10-CM

## 2019-05-08 DIAGNOSIS — B9562 Methicillin resistant Staphylococcus aureus infection as the cause of diseases classified elsewhere: Secondary | ICD-10-CM | POA: Diagnosis present

## 2019-05-08 HISTORY — PX: DRESSING CHANGE UNDER ANESTHESIA: SHX5237

## 2019-05-08 LAB — BASIC METABOLIC PANEL
Anion gap: 11 (ref 5–15)
BUN: 15 mg/dL (ref 8–23)
CO2: 24 mmol/L (ref 22–32)
Calcium: 8.6 mg/dL — ABNORMAL LOW (ref 8.9–10.3)
Chloride: 104 mmol/L (ref 98–111)
Creatinine, Ser: 0.72 mg/dL (ref 0.44–1.00)
GFR calc Af Amer: >60 mL/min (ref >60)
GFR calc non Af Amer: >60 mL/min (ref >60)
Glucose, Bld: 109 mg/dL — ABNORMAL HIGH (ref 70–99)
Potassium: 4 mmol/L (ref 3.5–5.1)
Sodium: 139 mmol/L (ref 135–145)

## 2019-05-08 LAB — CBC
HCT: 30.3 % — ABNORMAL LOW (ref 36.0–46.0)
Hemoglobin: 9.7 g/dL — ABNORMAL LOW (ref 12.0–15.0)
MCH: 28.9 pg (ref 26.0–34.0)
MCHC: 32 g/dL (ref 30.0–36.0)
MCV: 90.2 fL (ref 80.0–100.0)
Platelets: 316 10*3/uL (ref 150–400)
RBC: 3.36 MIL/uL — ABNORMAL LOW (ref 3.87–5.11)
RDW: 14.1 % (ref 11.5–15.5)
WBC: 11 10*3/uL — ABNORMAL HIGH (ref 4.0–10.5)
nRBC: 0 % (ref 0.0–0.2)

## 2019-05-08 SURGERY — REPLACEMENT, DRESSING, WITH ANESTHESIA
Anesthesia: General | Laterality: Right

## 2019-05-08 MED ORDER — FENTANYL CITRATE (PF) 100 MCG/2ML IJ SOLN: 25.0000 ug | INTRAMUSCULAR | Status: DC | PRN

## 2019-05-08 MED ORDER — FENTANYL CITRATE (PF) 100 MCG/2ML IJ SOLN
INTRAMUSCULAR | Status: DC | PRN
  Administered 2019-05-08: 25 ug via INTRAVENOUS

## 2019-05-08 MED ORDER — LIDOCAINE HCL (PF) 1 % IJ SOLN
INTRAMUSCULAR | Status: AC
  Filled 2019-05-08: qty 30

## 2019-05-08 MED ORDER — FENTANYL CITRATE (PF) 100 MCG/2ML IJ SOLN
INTRAMUSCULAR | Status: AC
  Administered 2019-05-08: 25 ug via INTRAVENOUS
  Filled 2019-05-08: qty 2

## 2019-05-08 MED ORDER — BUPIVACAINE HCL (PF) 0.25 % IJ SOLN
INTRAMUSCULAR | Status: AC
  Filled 2019-05-08: qty 30

## 2019-05-08 MED ORDER — DOCUSATE SODIUM 100 MG PO CAPS: 100.0000 mg | ORAL_CAPSULE | Freq: Two times a day (BID) | ORAL | 0 refills | Status: AC | PRN

## 2019-05-08 MED ORDER — EPINEPHRINE PF 1 MG/ML IJ SOLN
INTRAMUSCULAR | Status: AC
  Filled 2019-05-08: qty 1

## 2019-05-08 MED ORDER — ACETAMINOPHEN 160 MG/5ML PO SOLN
325.0000 mg | ORAL | Status: DC | PRN
  Filled 2019-05-08: qty 20.3

## 2019-05-08 MED ORDER — SULFAMETHOXAZOLE-TRIMETHOPRIM 800-160 MG PO TABS
1.0000 | ORAL_TABLET | Freq: Once | ORAL | Status: AC
  Administered 2019-05-08: 1 via ORAL
  Filled 2019-05-08: qty 1

## 2019-05-08 MED ORDER — MIDAZOLAM HCL 2 MG/2ML IJ SOLN
INTRAMUSCULAR | Status: DC | PRN
  Administered 2019-05-08: 1 mg via INTRAVENOUS

## 2019-05-08 MED ORDER — PROMETHAZINE HCL 25 MG/ML IJ SOLN: 6.2500 mg | INTRAMUSCULAR | Status: DC | PRN

## 2019-05-08 MED ORDER — FENTANYL CITRATE (PF) 100 MCG/2ML IJ SOLN
INTRAMUSCULAR | Status: AC
  Filled 2019-05-08: qty 2

## 2019-05-08 MED ORDER — SULFAMETHOXAZOLE-TRIMETHOPRIM 800-160 MG PO TABS: 1.0000 | ORAL_TABLET | Freq: Two times a day (BID) | ORAL | 0 refills | Status: DC

## 2019-05-08 MED ORDER — HYDROCODONE-ACETAMINOPHEN 5-325 MG PO TABS: 1.0000 | ORAL_TABLET | Freq: Four times a day (QID) | ORAL | 0 refills | Status: DC | PRN

## 2019-05-08 MED ORDER — ACETAMINOPHEN 325 MG PO TABS: 650.0000 mg | ORAL_TABLET | Freq: Three times a day (TID) | ORAL | 0 refills | Status: AC | PRN

## 2019-05-08 MED ORDER — MEPERIDINE HCL 50 MG/ML IJ SOLN: 6.2500 mg | INTRAMUSCULAR | Status: DC | PRN

## 2019-05-08 MED ORDER — LIDOCAINE HCL (PF) 2 % IJ SOLN
INTRAMUSCULAR | Status: AC
  Filled 2019-05-08: qty 10

## 2019-05-08 MED ORDER — PROPOFOL 500 MG/50ML IV EMUL
INTRAVENOUS | Status: DC | PRN
  Administered 2019-05-08: 120 ug/kg/min via INTRAVENOUS

## 2019-05-08 MED ORDER — IBUPROFEN 800 MG PO TABS: 800.0000 mg | ORAL_TABLET | Freq: Three times a day (TID) | ORAL | 0 refills | Status: AC | PRN

## 2019-05-08 MED ORDER — MIDAZOLAM HCL 2 MG/2ML IJ SOLN
INTRAMUSCULAR | Status: AC
  Filled 2019-05-08: qty 2

## 2019-05-08 MED ORDER — HYDROCODONE-ACETAMINOPHEN 7.5-325 MG PO TABS: 1.0000 | ORAL_TABLET | Freq: Once | ORAL | Status: DC | PRN

## 2019-05-08 MED ORDER — ACETAMINOPHEN 325 MG PO TABS: 325.0000 mg | ORAL_TABLET | ORAL | Status: DC | PRN

## 2019-05-08 MED ORDER — PROPOFOL 10 MG/ML IV BOLUS
INTRAVENOUS | Status: DC | PRN
  Administered 2019-05-08: 30 mg via INTRAVENOUS

## 2019-05-08 SURGICAL SUPPLY — 28 items
BLADE SURG 15 STRL LF DISP TIS (BLADE) ×1 IMPLANT
BLADE SURG 15 STRL SS (BLADE) ×2
CANISTER SUCT 1200ML W/VALVE (MISCELLANEOUS) ×3 IMPLANT
CHLORAPREP W/TINT 26 (MISCELLANEOUS) ×3 IMPLANT
COVER WAND RF STERILE (DRAPES) ×3 IMPLANT
DRAPE LAPAROTOMY 100X77 ABD (DRAPES) ×3 IMPLANT
ELECT REM PT RETURN 9FT ADLT (ELECTROSURGICAL) ×3
ELECTRODE REM PT RTRN 9FT ADLT (ELECTROSURGICAL) ×1 IMPLANT
GAUZE PACKING IODOFORM 1/2 (PACKING) IMPLANT
GAUZE SPONGE 4X4 12PLY STRL (GAUZE/BANDAGES/DRESSINGS) ×3 IMPLANT
GLOVE BIOGEL PI IND STRL 7.0 (GLOVE) ×1 IMPLANT
GLOVE BIOGEL PI INDICATOR 7.0 (GLOVE) ×2
GLOVE SURG SYN 6.5 ES PF (GLOVE) ×6 IMPLANT
GLOVE SURG SYN 6.5 PF PI (GLOVE) ×2 IMPLANT
GOWN STRL REUS W/ TWL LRG LVL3 (GOWN DISPOSABLE) ×2 IMPLANT
GOWN STRL REUS W/TWL LRG LVL3 (GOWN DISPOSABLE) ×4
LABEL OR SOLS (LABEL) ×3 IMPLANT
NEEDLE HYPO 22GX1.5 SAFETY (NEEDLE) ×3 IMPLANT
NS IRRIG 500ML POUR BTL (IV SOLUTION) ×3 IMPLANT
PACK BASIN MINOR ARMC (MISCELLANEOUS) ×3 IMPLANT
SUT MNCRL 4-0 (SUTURE) ×2
SUT MNCRL 4-0 27XMFL (SUTURE) ×1
SUT VIC AB 2-0 CT2 27 (SUTURE) ×3 IMPLANT
SUT VIC AB 3-0 SH 27 (SUTURE) ×2
SUT VIC AB 3-0 SH 27X BRD (SUTURE) ×1 IMPLANT
SUTURE MNCRL 4-0 27XMF (SUTURE) ×1 IMPLANT
SYR 10ML LL (SYRINGE) ×3 IMPLANT
TOWEL OR 17X26 4PK STRL BLUE (TOWEL DISPOSABLE) ×3 IMPLANT

## 2019-05-08 NOTE — Discharge Instructions (Signed)
I&D, Care After This sheet gives you information about how to care for yourself after your procedure. Your health care provider may also give you more specific instructions. If you have problems or questions, contact your health care provider. What can I expect after the procedure? After the procedure, it is common to have: Soreness. Bruising. Itching. Follow these instructions at home: site care Follow instructions from your health care provider about how to take care of your site. Make sure you: Wash your hands with soap and water before and after you change your bandage (dressing). If soap and water are not available, use hand sanitizer. Leave stitches (sutures), skin glue, or adhesive strips in place. These skin closures may need to stay in place for 2 weeks or longer. If adhesive strip edges start to loosen and curl up, you may trim the loose edges. Do not remove adhesive strips completely unless your health care provider tells you to do that. If the area bleeds or bruises, apply gentle pressure for 10 minutes. OK TO SHOWER IN 24HRS  Check your site every day for signs of infection. Check for: Redness, swelling, or pain. Fluid or blood. Warmth. Pus or a bad smell.  General instructions Rest and then return to your normal activities as told by your health care provider.  tylenol and advil as needed for discomfort.  Please alternate between the two every four hours as needed for pain.    Use narcotics, if prescribed, only when tylenol and motrin is not enough to control pain.  325-650mg every 8hrs to max of 3000mg/24hrs (including the 325mg in every norco dose) for the tylenol.    Advil up to 800mg per dose every 8hrs as needed for pain.   Keep all follow-up visits as told by your health care provider. This is important. Contact a health care provider if: You have redness, swelling, or pain around your site. You have fluid or blood coming from your site. Your site feels warm to the  touch. You have pus or a bad smell coming from your site. You have a fever. Your sutures, skin glue, or adhesive strips loosen or come off sooner than expected. Get help right away if: You have bleeding that does not stop with pressure or a dressing. Summary After the procedure, it is common to have some soreness, bruising, and itching at the site. Follow instructions from your health care provider about how to take care of your site. Check your site every day for signs of infection. Contact a health care provider if you have redness, swelling, or pain around your site, or your site feels warm to the touch. Keep all follow-up visits as told by your health care provider. This is important. This information is not intended to replace advice given to you by your health care provider. Make sure you discuss any questions you have with your health care provider. Document Released: 05/14/2015 Document Revised: 10/15/2017 Document Reviewed: 10/15/2017 Elsevier Interactive Patient Education  2019 [employer]   

## 2019-05-08 NOTE — Progress Notes (Signed)
Id Pt doing well Says she is a whole lot better  Patient Vitals for the past 24 hrs:  BP Temp Temp src Pulse Resp SpO2 Height Weight  05/08/19 1621 128/87 98.2 F (36.8 C) Oral 66 18 100 % -- --  05/08/19 1327 121/65 (!) 97.3 F (36.3 C) -- -- -- -- -- --  05/08/19 1257 97/63 (!) 96.6 F (35.9 C) -- -- -- -- -- --  05/08/19 1256 97/63 (!) 96.6 F (35.9 C) -- (!) 57 18 98 % -- --  05/08/19 1149 119/78 (!) 96.9 F (36.1 C) Tympanic (!) 59 18 98 % 5\' 4"  (1.626 m) 86.2 kg  05/08/19 0757 113/74 (!) 97.5 F (36.4 C) Oral (!) 54 18 98 % -- --  05/08/19 0500 -- -- -- -- -- -- -- 87.8 kg  05/07/19 2338 100/71 97.9 F (36.6 C) Oral 61 16 97 % -- --    Getting ready to go home Rt breast/rt side of chest I/s site packed- redness, swelling and tenderness much better  Chest b/l air entry [id number]   Labs CBC Latest Ref Rng & Units 05/08/2019 05/07/2019 05/06/2019  WBC 4.0 - 10.5 K/uL 11.0(H) 8.4 8.7  Hemoglobin 12.0 - 15.0 g/dL 07/04/2019) 11.1(L) 11.4(L)  Hematocrit 36.0 - 46.0 % 30.3(L) 33.6(L) 34.5(L)  Platelets 150 - 400 K/uL 316 283 256    CMP Latest Ref Rng & Units 05/08/2019 05/07/2019 05/06/2019  Glucose 70 - 99 mg/dL 07/04/2019) 185(U) 93  BUN 8 - 23 mg/dL 15 14 11   Creatinine 0.44 - 1.00 mg/dL 314(H 7.02  Sodium 135 - 145 mmol/L 139 134(L) 134(L)  Potassium 3.5 - 5.1 mmol/L 4.0 4.0 3.9  Chloride 98 - 111 mmol/L 104 100 99  CO2 22 - 32 mmol/L 24 24 24   Calcium 8.9 - 10.3 mg/dL 6.37) 8.58) 8.9  Total Protein 6.5 - 8.1 g/dL - - -  Total Bilirubin 0.3 - 1.2 mg/dL - - -  Alkaline Phos 38 - 126 U/L - - -  AST 15 - 41 U/L - - -  ALT 0 - 44 U/L - - -     Microbiology: culture MRSA   Impression/recommendation  MRSA skin and soft tissue infection with abscess and surrounding cellulitis- s/p I/D and packing Currently on vancomycin- can be switched to Po bactrim DS 1 BID for 10 days on discharge Pt has not received any vanco today so will give her a dose of PO bactrim before  discharge. Discussed the management with patient and her nurse. She will follow up with Dr.Sakai as OP

## 2019-05-08 NOTE — Transfer of Care (Signed)
Immediate Anesthesia Transfer of Care Note  Patient: [name]  Procedure(s) Performed: DRESSING CHANGE UNDER ANESTHESIA (Right )  Patient Location: PACU  Anesthesia Type:General  Level of Consciousness: awake and alert   Airway & Oxygen Therapy: Patient Spontanous Breathing  Post-op Assessment: Report given to RN and Post -op Vital signs reviewed and stable  Post vital signs: Reviewed and stable  Last Vitals:  Vitals Value Taken Time  BP 97/63 05/08/19 1256  Temp 35.9 C 05/08/19 1256  Pulse 48 05/08/19 1257  Resp 23 05/08/19 1257  SpO2 97 % 05/08/19 1257  Vitals shown include unvalidated device data.  Last Pain:  Vitals:   05/08/19 1256  TempSrc:   PainSc: 0-No pain      Patients Stated Pain Goal: 0 (05/08/19 1149)  Complications: No apparent anesthesia complications

## 2019-05-08 NOTE — Op Note (Signed)
Preoperative diagnosis: Right axillary abscess Postoperative diagnosis: same  Procedure: Dressing change of right axillary abscess  Anesthesia:  MAC  Surgeon: Lysle Pearl  Wound Classification: Contaminated  Indications: Patient is a 64 y.o. female  presented with above.  See H&P for further details.  Specimen:  None  Complications: None  Estimated Blood Loss: 0 mL  Findings:  1.  Large right axillary abscess pocket with no additional signs of infection or active bleeding.  Description of procedure: The patient was placed in the supine position and MAC anesthesia was induced. The area was prepped and draped in the usual sterile fashion. A timeout was completed verifying correct patient, procedure, site, positioning, and implant(s) and/or special equipment prior to beginning this procedure.  Local infused over planned incision site.  Prior Kerlix roll packing was removed completely and the wound extensively inspected.  No Evidence of further signs of infection such as drainage or necrotic tissue noted within the 10cm x 8cm cavity.  Improved erythema and induration surrounding the 5 cm incision. no signs of active bleeding noted either.  Wound was then repacked with wet-to-dry Kerlix roll, with ABD pad, secured with paper tape.  Patient tolerated procedure well.  Awakened from anesthesia and transferred to PACU in stable condition.  Pungent instrument count correct at the end of procedure.

## 2019-05-08 NOTE — Progress Notes (Signed)
Pt d/c home this evening at 1845 with her son.  One PO dose of Bactrim DS was given just prior to d/c.  All discharge paperwork was reviewed with pt and prescriptions were given.  Belongings were packed up and taken with pt at time of d/c.  Dressing to R axilla was intact but was reinforced while pt was getting dresseds.  Pt did report pain to R axilla site she stated she wanted to take something for the pain but at the same time her son called and stated he was downstairs waiting for her, it was then the pt stated she did not want to take anything for the pain and that she was ready to "go ahead and go".  This nurse gathered all bags removed IV to L hand, catheter intact, and wheeled pt to [facility] entrance and assisted to her into her son truck.

## 2019-05-08 NOTE — Discharge Summary (Signed)
Physician Discharge Summary  Alli Cannizzo TDS:[id number] DOB: July 06, 1955 DOA: 05/04/2019  PCP: Iran Ouch, MD  Admit date: 05/04/2019 Discharge date: 05/08/2019  Admitted From: Home Disposition: Home  Recommendations for Outpatient Follow-up:  Follow up with surgery in 2 weeks.  Patient will be discharged on 10 more days of oral Bactrim.  Home health arranged  Home Health: RN Equipment/Devices: None  Discharge Condition: Fair CODE STATUS: Full code Diet recommendation: Heart Healthy    Discharge Diagnoses:  Principal Problem:    Cellulitis and abscess of upper extremity  Active Problems:   CAD (coronary artery disease)   HLD (hyperlipidemia)   Hyponatremia   Moderate protein-calorie malnutrition (HCC)   Cellulitis of right breast   Cellulitis of axillary region   Cellulitis due to methicillin-resistant Staphylococcus aureus (MRSA) Obesity (BMI 32.61 kg/m) Acute kidney injury  Brief narrative/HPI 64 year old obese female with history of coronary artery disease, hyperlipidemia presented with cellulitis of the right axilla and breast with evolving abscess.  Seen by surgery and underwent I&D in the OR.  Hospital course  Principal Problem:   Cellulitis with abscess of the right breast and axilla Underwent I&D of the abscess on 1/5.  Given the size of the abscess and bleeding during the initial I&D surgery plans on dressing change done in the OR.  Wound appears clean and well-healing. Surgery recommends discharge home with home health for wet-to-dry dressing. Wound culture growing MRSA sensitive to Bactrim.  Patient was receiving IV vancomycin and cefazolin while in the hospital.  We will discharge her on oral Bactrim for 10 more days. ID recommendations appreciated Outpatient follow-up with surgery in 2 weeks   Active Problems: Hyponatremia Likely prerenal.  Improved with fluids  Moderate protein calorie malnutrition Continue prostat  Acute kidney  injury Possibly prerenal.  Resolved with fluids   Coronary artery disease Stable.  Continue aspirin, Coreg and statin.  COPD Stable.  Continue bronchodilators  Hyperglycemia No history of diabetes mellitus.  CBG in 200s.  Check A1c.  Obesity (BMI 66.15 kg/m) Counseled on diet and exercise to lose weight.  Patient is motivated     Family Communication  : None at bedside  Disposition Plan  : Home with home health    Consults  : Surgery, ID  Procedures  : CT axilla, I&D of the abscess Discharge Instructions   Allergies as of 05/08/2019      Reactions   Penicillins Rash   Has patient had a PCN reaction causing immediate rash, facial/tongue/throat swelling, SOB or lightheadedness with hypotension: Yes Has patient had a PCN reaction causing severe rash involving mucus membranes or skin necrosis: No Has patient had a PCN reaction that required hospitalization No Has patient had a PCN reaction occurring within the last 10 years: No If all of the above answers are "NO", then may proceed with Cephalosporin use.      Medication List    STOP taking these medications   traMADol 50 MG tablet Commonly known as: Ultram     TAKE these medications   acetaminophen 325 MG tablet Commonly known as: Tylenol Take 2 tablets (650 mg total) by mouth every 8 (eight) hours as needed for mild pain. What changed:   when to take this  reasons to take this   albuterol 108 (90 Base) MCG/ACT inhaler Commonly known as: VENTOLIN HFA INHALE 2 PUFFS INTO THE LUNGS EVERY 6 HOURS AS NEEDED FOR WHEEZING OR SHORTNESS OF BREATH.   aspirin 81 MG chewable tablet Chew 1 tablet (81  mg total) by mouth daily.   carvedilol 3.125 MG tablet Commonly known as: COREG TAKE 1 TABLET BY MOUTH TWICE A DAY   docusate sodium 100 MG capsule Commonly known as: Colace Take 1 capsule (100 mg total) by mouth 2 (two) times daily as needed for up to 10 days for mild constipation.    HYDROcodone-acetaminophen 5-325 MG tablet Commonly known as: Norco Take 1 tablet by mouth every 6 (six) hours as needed for up to 12 doses for moderate pain or severe pain.   ibuprofen 800 MG tablet Commonly known as: ADVIL Take 1 tablet (800 mg total) by mouth every 8 (eight) hours as needed for mild pain or moderate pain.   losartan 50 MG tablet Commonly known as: COZAAR Take 1 tablet (50 mg total) by mouth daily.   omeprazole 20 MG capsule Commonly known as: PRILOSEC TAKE 1 CAPSULE BY MOUTH TWICE DAILY BEFORE MEALS   Praluent 75 MG/ML Soaj Generic drug: Alirocumab INJECT 75 MG INTO THE SKIN EVERY 14 DAYSAS DIRECTED   rosuvastatin 40 MG tablet Commonly known as: CRESTOR TAKE 1 TABLET BY MOUTH ONCE A DAY   sulfamethoxazole-trimethoprim 800-160 MG tablet Commonly known as: BACTRIM DS Take 1 tablet by mouth 2 (two) times daily.   Symbicort 160-4.5 MCG/ACT inhaler Generic drug: budesonide-formoterol INHALE 1 PUFF INTO THE LUNGS 2 TIMES DAILY. RINSE MOUTH AFTER USE.      Follow-up Information    Iran Ouch, MD Follow up in 4 week(s).   Specialty: Cardiology Contact information: [address] [place] [address] [phone]        Sung Amabile, DO Follow up in 2 week(s).   Specialty: Surgery Contact information: [address] [place] [address] [phone]          Allergies  Allergen Reactions  . Penicillins Rash    Has patient had a PCN reaction causing immediate rash, facial/tongue/throat swelling, SOB or lightheadedness with hypotension: Yes Has patient had a PCN reaction causing severe rash involving mucus membranes or skin necrosis: No Has patient had a PCN reaction that required hospitalization No Has patient had a PCN reaction occurring within the last 10 years: No If all of the above answers are "NO", then may proceed with Cephalosporin use.        Procedures/Studies: CT Chest W Contrast  Result Date:  05/04/2019 CLINICAL DATA:  Cellulitis of the axilla with concern for abscess. EXAM: CT CHEST WITH CONTRAST TECHNIQUE: Multidetector CT imaging of the chest was performed during intravenous contrast administration. CONTRAST:  75mL OMNIPAQUE IOHEXOL 300 MG/ML  SOLN COMPARISON:  None. FINDINGS: Cardiovascular: Aortic calcifications are noted without evidence for dissection or aneurysm. The heart size is normal. Coronary artery calcifications are noted. There is no large centrally located pulmonary embolism. Mediastinum/Nodes: --No mediastinal or hilar lymphadenopathy. --there is mild right axillary adenopathy, presumably reactive. --No supraclavicular lymphadenopathy. --Normal thyroid gland. --the esophagus is fluid-filled to the level of the mid thorax. Lungs/Pleura: No pulmonary nodules or masses. No pleural effusion or pneumothorax. No focal airspace consolidation. No focal pleural abnormality. Upper Abdomen: No acute abnormality. Musculoskeletal: There is fat stranding in the right axillary region with mild overlying skin thickening. There is no evidence for well-formed drainable fluid collection. IMPRESSION: 1. There is fat stranding in the right axillary region with mild overlying skin thickening. There is no evidence for well-formed drainable fluid collection. Findings are consistent with a soft tissue infection in the appropriate clinical setting. 2. Coronary artery disease. Aortic Atherosclerosis (  ICD10-I70.0). Electronically Signed   By: Katherine Mantle M.D.   On: 05/04/2019 18:16     Subjective: Pain better controlled.  Feels better.  Discharge Exam: Vitals:   05/08/19 1257 05/08/19 1327  BP: 97/63 121/65  Pulse:    Resp:    Temp: (!) 96.6 F (35.9 C) (!) 97.3 F (36.3 C)  SpO2:     Vitals:   05/08/19 1149 05/08/19 1256 05/08/19 1257 05/08/19 1327  BP: 119/78 97/63 97/63  121/65  Pulse: (!) 59 (!) 57    Resp: 18 18    Temp: (!) 96.9 F (36.1 C) (!) 96.6 F (35.9 C) (!) 96.6 F (35.9  C) (!) 97.3 F (36.3 C)  TempSrc: Tympanic     SpO2: 98% 98%    Weight: 86.2 kg     Height: 5\' 4"  (1.626 m)       General: Elderly obese female not in distress Clean dressing over the right axilla and breast, erythema much improved Chest: Clear CVs: Normal S1-S2 GI: Soft, nondistended, nontender Musculoskeletal: Warm, no edema      The results of significant diagnostics from this hospitalization (including imaging, microbiology, ancillary and laboratory) are listed below for reference.     Microbiology: Recent Results (from the past 240 hour(s))  Culture, blood (routine x 2)     Status: None (Preliminary result)   Collection Time: 05/04/19  3:58 PM   Specimen: BLOOD  Result Value Ref Range Status   Specimen Description BLOOD BLOOD LEFT WRIST  Final   Special Requests   Final    BOTTLES DRAWN AEROBIC AND ANAEROBIC Blood Culture results may not be optimal due to an inadequate volume of blood received in culture bottles   Culture   Final    NO GROWTH 4 DAYS Performed at [facility], [address], [place], [place]    Report Status PENDING  Incomplete  Culture, blood (routine x 2)     Status: None (Preliminary result)   Collection Time: 05/04/19  3:58 PM   Specimen: BLOOD  Result Value Ref Range Status   Specimen Description BLOOD LEFT ANTECUBITAL  Final   Special Requests   Final    BOTTLES DRAWN AEROBIC AND ANAEROBIC Blood Culture results may not be optimal due to an excessive volume of blood received in culture bottles   Culture   Final    NO GROWTH 4 DAYS Performed at [facility], [address], [place], [place]    Report Status PENDING  Incomplete  Respiratory Panel by RT PCR (Flu A&B, Covid) - Nasopharyngeal Swab     Status: None   Collection Time: 05/04/19  4:31 PM   Specimen: Nasopharyngeal Swab  Result Value Ref Range Status   SARS Coronavirus 2 by RT PCR NEGATIVE NEGATIVE Final    Comment: (NOTE) SARS-CoV-2  target nucleic acids are NOT DETECTED. The SARS-CoV-2 RNA is generally detectable in upper respiratoy specimens during the acute phase of infection. The lowest concentration of SARS-CoV-2 viral copies this assay can detect is 131 copies/mL. A negative result does not preclude SARS-Cov-2 infection and should not be used as the sole basis for treatment or other patient management decisions. A negative result may occur with  improper specimen collection/handling, submission of specimen other than nasopharyngeal swab, presence of viral mutation(s) within the areas targeted by this assay, and inadequate number of viral copies (<131 copies/mL). A negative result must be combined with clinical observations, patient history, and epidemiological information. The expected  result is Negative. Fact Sheet for Patients:  https://www.moore.com/ Fact Sheet for Healthcare Providers:  https://www.young.biz/ This test is not yet ap proved or cleared by the [place] FDA and  has been authorized for detection and/or diagnosis of SARS-CoV-2 by FDA under an Emergency Use Authorization (EUA). This EUA will remain  in effect (meaning this test can be used) for the duration of the COVID-19 declaration under Section 564(b)(1) of the Act, 21 U.S.C. section 360bbb-3(b)(1), unless the authorization is terminated or revoked sooner.    Influenza A by PCR NEGATIVE NEGATIVE Final   Influenza B by PCR NEGATIVE NEGATIVE Final    Comment: (NOTE) The Xpert Xpress SARS-CoV-2/FLU/RSV assay is intended as an aid in  the diagnosis of influenza from Nasopharyngeal swab specimens and  should not be used as a sole basis for treatment. Nasal washings and  aspirates are unacceptable for Xpert Xpress SARS-CoV-2/FLU/RSV  testing. Fact Sheet for Patients: https://www.moore.com/ Fact Sheet for Healthcare Providers: https://www.young.biz/ This test is  not yet approved or cleared by the [place] FDA and  has been authorized for detection and/or diagnosis of SARS-CoV-2 by  FDA under an Emergency Use Authorization (EUA). This EUA will remain  in effect (meaning this test can be used) for the duration of the  Covid-19 declaration under Section 564(b)(1) of the Act, 21  U.S.C. section 360bbb-3(b)(1), unless the authorization is  terminated or revoked. Performed at [facility], [address] [place], [place] [address]   Aerobic/Anaerobic Culture (surgical/deep wound)     Status: None (Preliminary result)   Collection Time: 05/06/19  4:34 PM   Specimen: PATH Other; Tissue  Result Value Ref Range Status   Specimen Description   Final    ABSCESS RIGHT AXILLARY Performed at [facility], [address], [place], [place] [address]    Special Requests   Final    NONE Performed at [facility], [address] [place], [place] [address]    Gram Stain   Final    FEW WBC PRESENT, PREDOMINANTLY PMN RARE GRAM POSITIVE COCCI Performed at [facility], [address] [place], [place] [address]    Culture   Final    FEW METHICILLIN RESISTANT STAPHYLOCOCCUS AUREUS NO ANAEROBES ISOLATED; CULTURE IN PROGRESS FOR 5 DAYS    Report Status PENDING  Incomplete   Organism ID, Bacteria METHICILLIN RESISTANT STAPHYLOCOCCUS AUREUS  Final      Susceptibility   Methicillin resistant staphylococcus aureus - MIC*    CIPROFLOXACIN >=8 RESISTANT Resistant     ERYTHROMYCIN >=8 RESISTANT Resistant     GENTAMICIN <=0.5 SENSITIVE Sensitive     OXACILLIN >=4 RESISTANT Resistant     TETRACYCLINE <=1 SENSITIVE Sensitive     VANCOMYCIN 1 SENSITIVE Sensitive     TRIMETH/SULFA <=10 SENSITIVE Sensitive     CLINDAMYCIN <=0.25 SENSITIVE Sensitive     RIFAMPIN <=0.5 SENSITIVE Sensitive     Inducible Clindamycin NEGATIVE Sensitive     * FEW METHICILLIN RESISTANT STAPHYLOCOCCUS AUREUS     Labs: BNP (last 3 results) No  results for input(s): BNP in the last 8760 hours. Basic Metabolic Panel: Recent Labs  Lab 05/04/19 1558 05/05/19 0402 05/06/19 0601 05/07/19 0358 05/08/19 0433  NA 133* 130* 134* 134* 139  K 3.9 3.7 3.9 4.0 4.0  CL 96* 96* 99 100 104  CO2 25 23 24 24 24   GLUCOSE 114* 117* 93 227* 109*  BUN 22 22 11 14 15   CREATININE 1.14* 1.01* 0.66 0.67  0.72  CALCIUM 9.2 8.8* 8.9 8.7* 8.6*   Liver Function Tests: Recent Labs  Lab 05/04/19 1558 05/05/19 0402  AST 29 23  ALT 20 19  ALKPHOS 83 72  BILITOT 0.6 0.4  PROT 8.0 7.0  ALBUMIN 3.4* 2.9*   No results for input(s): LIPASE, AMYLASE in the last 168 hours. No results for input(s): AMMONIA in the last 168 hours. CBC: Recent Labs  Lab 05/04/19 1558 05/05/19 0402 05/06/19 0601 05/07/19 0358 05/08/19 0433  WBC 12.9* 11.0* 8.7 8.4 11.0*  NEUTROABS 10.5*  --   --   --   --   HGB 12.0 11.1* 11.4* 11.1* 9.7*  HCT 37.5 32.0* 34.5* 33.6* 30.3*  MCV 90.1 84.9 88.9 88.9 90.2  PLT 251 236 256 283 316   Cardiac Enzymes: No results for input(s): CKTOTAL, CKMB, CKMBINDEX, TROPONINI in the last 168 hours. BNP: Invalid input(s): POCBNP CBG: No results for input(s): GLUCAP in the last 168 hours. D-Dimer No results for input(s): DDIMER in the last 72 hours. Hgb A1c Recent Labs    05/07/19 0358  HGBA1C 5.6   Lipid Profile No results for input(s): CHOL, HDL, LDLCALC, TRIG, CHOLHDL, LDLDIRECT in the last 72 hours. Thyroid function studies No results for input(s): TSH, T4TOTAL, T3FREE, THYROIDAB in the last 72 hours.  Invalid input(s): FREET3 Anemia work up No results for input(s): VITAMINB12, FOLATE, FERRITIN, TIBC, IRON, RETICCTPCT in the last 72 hours. Urinalysis No results found for: COLORURINE, APPEARANCEUR, [place], [place], [place], [place], [place], [place], [place], UROBILINOGEN, NITRITE, LEUKOCYTESUR Sepsis Labs Invalid input(s): PROCALCITONIN,  WBC,  LACTICIDVEN Microbiology Recent Results (from the past 240  hour(s))  Culture, blood (routine x 2)     Status: None (Preliminary result)   Collection Time: 05/04/19  3:58 PM   Specimen: BLOOD  Result Value Ref Range Status   Specimen Description BLOOD BLOOD LEFT WRIST  Final   Special Requests   Final    BOTTLES DRAWN AEROBIC AND ANAEROBIC Blood Culture results may not be optimal due to an inadequate volume of blood received in culture bottles   Culture   Final    NO GROWTH 4 DAYS Performed at [facility], [address] [place], [place] [address]    Report Status PENDING  Incomplete  Culture, blood (routine x 2)     Status: None (Preliminary result)   Collection Time: 05/04/19  3:58 PM   Specimen: BLOOD  Result Value Ref Range Status   Specimen Description BLOOD LEFT ANTECUBITAL  Final   Special Requests   Final    BOTTLES DRAWN AEROBIC AND ANAEROBIC Blood Culture results may not be optimal due to an excessive volume of blood received in culture bottles   Culture   Final    NO GROWTH 4 DAYS Performed at [facility], [address], [place], [place] [address]    Report Status PENDING  Incomplete  Respiratory Panel by RT PCR (Flu A&B, Covid) - Nasopharyngeal Swab     Status: None   Collection Time: 05/04/19  4:31 PM   Specimen: Nasopharyngeal Swab  Result Value Ref Range Status   SARS Coronavirus 2 by RT PCR NEGATIVE NEGATIVE Final    Comment: (NOTE) SARS-CoV-2 target nucleic acids are NOT DETECTED. The SARS-CoV-2 RNA is generally detectable in upper respiratoy specimens during the acute phase of infection. The lowest concentration of SARS-CoV-2 viral copies this assay can detect is 131 copies/mL. A negative result does not preclude SARS-Cov-2 infection and should not be used as the  sole basis for treatment or other patient management decisions. A negative result may occur with  improper specimen collection/handling, submission of specimen other than nasopharyngeal swab, presence of viral mutation(s) within  the areas targeted by this assay, and inadequate number of viral copies (<131 copies/mL). A negative result must be combined with clinical observations, patient history, and epidemiological information. The expected result is Negative. Fact Sheet for Patients:  https://www.moore.com/ Fact Sheet for Healthcare Providers:  https://www.young.biz/ This test is not yet ap proved or cleared by the [place] FDA and  has been authorized for detection and/or diagnosis of SARS-CoV-2 by FDA under an Emergency Use Authorization (EUA). This EUA will remain  in effect (meaning this test can be used) for the duration of the COVID-19 declaration under Section 564(b)(1) of the Act, 21 U.S.C. section 360bbb-3(b)(1), unless the authorization is terminated or revoked sooner.    Influenza A by PCR NEGATIVE NEGATIVE Final   Influenza B by PCR NEGATIVE NEGATIVE Final    Comment: (NOTE) The Xpert Xpress SARS-CoV-2/FLU/RSV assay is intended as an aid in  the diagnosis of influenza from Nasopharyngeal swab specimens and  should not be used as a sole basis for treatment. Nasal washings and  aspirates are unacceptable for Xpert Xpress SARS-CoV-2/FLU/RSV  testing. Fact Sheet for Patients: https://www.moore.com/ Fact Sheet for Healthcare Providers: https://www.young.biz/ This test is not yet approved or cleared by the [place] FDA and  has been authorized for detection and/or diagnosis of SARS-CoV-2 by  FDA under an Emergency Use Authorization (EUA). This EUA will remain  in effect (meaning this test can be used) for the duration of the  Covid-19 declaration under Section 564(b)(1) of the Act, 21  U.S.C. section 360bbb-3(b)(1), unless the authorization is  terminated or revoked. Performed at [facility], [address] [place], [place] [address]   Aerobic/Anaerobic Culture (surgical/deep wound)     Status: None  (Preliminary result)   Collection Time: 05/06/19  4:34 PM   Specimen: PATH Other; Tissue  Result Value Ref Range Status   Specimen Description   Final    ABSCESS RIGHT AXILLARY Performed at [facility], [address], [place], [place] [address]    Special Requests   Final    NONE Performed at [facility], [address] [place], [place] [address]    Gram Stain   Final    FEW WBC PRESENT, PREDOMINANTLY PMN RARE GRAM POSITIVE COCCI Performed at [facility], [address] [place], [place] [address]    Culture   Final    FEW METHICILLIN RESISTANT STAPHYLOCOCCUS AUREUS NO ANAEROBES ISOLATED; CULTURE IN PROGRESS FOR 5 DAYS    Report Status PENDING  Incomplete   Organism ID, Bacteria METHICILLIN RESISTANT STAPHYLOCOCCUS AUREUS  Final      Susceptibility   Methicillin resistant staphylococcus aureus - MIC*    CIPROFLOXACIN >=8 RESISTANT Resistant     ERYTHROMYCIN >=8 RESISTANT Resistant     GENTAMICIN <=0.5 SENSITIVE Sensitive     OXACILLIN >=4 RESISTANT Resistant     TETRACYCLINE <=1 SENSITIVE Sensitive     VANCOMYCIN 1 SENSITIVE Sensitive     TRIMETH/SULFA <=10 SENSITIVE Sensitive     CLINDAMYCIN <=0.25 SENSITIVE Sensitive     RIFAMPIN <=0.5 SENSITIVE Sensitive     Inducible Clindamycin NEGATIVE Sensitive     * FEW METHICILLIN RESISTANT STAPHYLOCOCCUS AUREUS     Time coordinating discharge: 35 minutes  SIGNED:   Eddie North, MD  [facility] 05/08/2019, 2:40 PM Pager  If 7PM-7AM, please contact night-coverage [email or web address] Password [email or web address]

## 2019-05-08 NOTE — Anesthesia Preprocedure Evaluation (Addendum)
Anesthesia Evaluation  Patient identified by MRN, date of birth, ID band Patient awake    Reviewed: Allergy & Precautions, H&P , NPO status , reviewed documented beta blocker date and time   Airway Mallampati: III  TM Distance: >3 FB Neck ROM: full    Dental  (+) Edentulous Upper, Edentulous Lower   Pulmonary COPD, Current Smoker and Patient abstained from smoking.,    Pulmonary exam normal        Cardiovascular + CAD, + Past MI and + Cardiac Stents  Normal cardiovascular exam+ dysrhythmias + Valvular Problems/Murmurs   09/2015 ECHO Study Conclusions  - Procedure narrative: Transthoracic echocardiography. The study   was technically difficult. - Left ventricle: The cavity size was normal. Wall thickness was   increased in the posterior segment. Systolic function was at the   lower limits of normal. The estimated ejection fraction was 50%.   There is mild hypokinesis of the anteroseptal and anterior   myocardium. Left ventricular diastolic function parameters were   normal for the patient&'s age. - Aortic valve: Valve area (Vmax): 2.36 cm^2.  Cardiac DES stent 09/2015   Neuro/Psych PSYCHIATRIC DISORDERS Anxiety Depression    GI/Hepatic GERD  ,  Endo/Other    Renal/GU      Musculoskeletal  (+) Arthritis ,   Abdominal   Peds  Hematology   Anesthesia Other Findings Past Medical History: No date: CAD (coronary artery disease)     Comment:  a. NSTEMI 10/03/15; b. cardiac cath 10/04/15: LM nl,               ost-pLAD 90% s/p PCI/DES, mLAD 80% s/p PCI/DES, mRCA 30%,              D1,2,3 nl, p-mLCX 30%, OM1 &2 min irregs, OM3 normal, EF               30-35%, sev HK of mid-distal ant, apical, inf wall, mod               elevated LVEDP No date: Chronic bronchitis (HCC) No date: Chronic cough No date: COPD (chronic obstructive pulmonary disease) (HCC) No date: Dysrhythmia No date: GERD (gastroesophageal reflux disease) No  date: Heart murmur No date: HLD (hyperlipidemia) No date: Ischemic cardiomyopathy     Comment:  a. echo 10/05/15: EF 50%, mild HK of anteroseptal and               anterior wall, nl LV diastolic fxn 99/8338: Myocardial infarction ([place]) No date: Status post primary angioplasty with coronary stent No date: Tobacco abuse No date: Wears dentures     Comment:  full upper and lower  Past Surgical History: 10/04/2015: CARDIAC CATHETERIZATION; N/A     Comment:  Procedure: Left Heart Cath and Coronary Angiography;                Surgeon: Wellington Hampshire, MD;  Location: [place]               CV LAB;  Service: Cardiovascular;  Laterality: N/A; 10/04/2015: CARDIAC CATHETERIZATION; N/A     Comment:  Procedure: Coronary Stent Intervention;  Surgeon:               Wellington Hampshire, MD;  Location: [place] CV [facility];                Service: Cardiovascular;  Laterality: N/A; 12/18/2018: CATARACT EXTRACTION W/PHACO; Right     Comment:  Procedure: CATARACT EXTRACTION PHACO AND INTRAOCULAR  LENS PLACEMENT (IOC)  RIGHT;  Surgeon: Lockie Mola, MD;  Location: [facility];  Service:               Ophthalmology;  Laterality: Right; 01/08/2019: CATARACT EXTRACTION W/PHACO; Left     Comment:  Procedure: CATARACT EXTRACTION PHACO AND INTRAOCULAR               LENS PLACEMENT (IOC) LEFT;  Surgeon: Lockie Mola, MD;  Location: [facility];  Service:               Ophthalmology;  Laterality: Left;  leave arrival at               10:30  Total Time: 00:43.0 Total Equivalent Power:               14.4% Cumulative Dissipated Energy: 6.23 05/06/2019: INCISION AND DRAINAGE ABSCESS; Right     Comment:  Procedure: INCISION AND DRAINAGE ABSCESS, RIGHT;                Surgeon: Sung Amabile, DO;  Location: [facility] ORS;  Service:              General;  Laterality: Right;  BMI    Body Mass Index: 27.00 kg/m      Reproductive/Obstetrics                             Anesthesia Physical Anesthesia Plan  ASA: III  Anesthesia Plan: General   Post-op Pain Management:    Induction: Intravenous  PONV Risk Score and Plan: 2 and Midazolam, Ondansetron and TIVA  Airway Management Planned: Nasal Cannula and Natural Airway  Additional Equipment:   Intra-op Plan:   Post-operative Plan:   Informed Consent: I have reviewed the patients History and Physical, chart, labs and discussed the procedure including the risks, benefits and alternatives for the proposed anesthesia with the patient or authorized representative who has indicated his/her understanding and acceptance.     Dental Advisory Given  Plan Discussed with: CRNA  Anesthesia Plan Comments:        Anesthesia Quick Evaluation

## 2019-05-08 NOTE — Interval H&P Note (Signed)
History and Physical Interval Note:  05/08/2019 11:56 AM  [name]  has presented today for surgery, with the diagnosis of RIGHT AXILLARY ABSCESS.  The various methods of treatment have been discussed with the patient and family. After consideration of risks, benefits and other options for treatment, the patient has consented to  Procedure(s): DRESSING CHANGE UNDER ANESTHESIA (Right) as a surgical intervention.  The patient's history has been reviewed, patient examined, no change in status, stable for surgery.  I have reviewed the patient's chart and labs.  Questions were answered to the patient's satisfaction.     [name] Tonna Boehringer

## 2019-05-09 LAB — CULTURE, BLOOD (ROUTINE X 2)
Culture: NO GROWTH
Culture: NO GROWTH

## 2019-05-10 NOTE — Anesthesia Postprocedure Evaluation (Signed)
Anesthesia Post Note  Patient: Scientist, product/process development  Procedure(s) Performed: DRESSING CHANGE UNDER ANESTHESIA (Right )  Patient location during evaluation: PACU Anesthesia Type: General Level of consciousness: awake and alert Pain management: pain level controlled Vital Signs Assessment: post-procedure vital signs reviewed and stable Respiratory status: spontaneous breathing, nonlabored ventilation and respiratory function stable Cardiovascular status: blood pressure returned to baseline and stable Postop Assessment: no apparent nausea or vomiting Anesthetic complications: no     Last Vitals:  Vitals:   05/08/19 1327 05/08/19 1621  BP: 121/65 128/87  Pulse:  66  Resp:  18  Temp: (!) 36.3 C 36.8 C  SpO2:  100%    Last Pain:  Vitals:   05/08/19 1621  TempSrc: Oral  PainSc:                  Christia Reading

## 2019-05-11 LAB — AEROBIC/ANAEROBIC CULTURE W GRAM STAIN (SURGICAL/DEEP WOUND)

## 2019-05-13 ENCOUNTER — Telehealth: Payer: Self-pay

## 2019-05-13 NOTE — Telephone Encounter (Signed)
Daughter called upset that patient had to retuen to ED for dressing change. I explained that is not up to [place] as far as when or how she gets this dressing changed and I am so sorry that she had to go through this. Daughter said ED was also upset. She agrees this is up to surgeon and she said patient will see them today as well as Clovis Cao will see Wound clinic.

## 2019-05-16 ENCOUNTER — Encounter: Payer: Medicare Other | Attending: Physician Assistant | Admitting: Physician Assistant

## 2019-05-16 ENCOUNTER — Other Ambulatory Visit: Payer: Self-pay

## 2019-05-16 ENCOUNTER — Ambulatory Visit: Payer: Medicare Other | Admitting: Physician Assistant

## 2019-05-16 DIAGNOSIS — F17210 Nicotine dependence, cigarettes, uncomplicated: Secondary | ICD-10-CM | POA: Insufficient documentation

## 2019-05-16 DIAGNOSIS — I252 Old myocardial infarction: Secondary | ICD-10-CM | POA: Insufficient documentation

## 2019-05-16 DIAGNOSIS — L98492 Non-pressure chronic ulcer of skin of other sites with fat layer exposed: Secondary | ICD-10-CM | POA: Diagnosis not present

## 2019-05-16 DIAGNOSIS — J449 Chronic obstructive pulmonary disease, unspecified: Secondary | ICD-10-CM | POA: Diagnosis not present

## 2019-05-16 DIAGNOSIS — I10 Essential (primary) hypertension: Secondary | ICD-10-CM | POA: Insufficient documentation

## 2019-05-16 NOTE — Progress Notes (Signed)
[name] ([id number]) Visit Report for 05/16/2019 Allergy List Details Patient Name: [name], [name] Date of Service: 05/16/2019 8:00 AM Medical Record Number: [id number] Patient Account Number: [id number] Date of Birth/Sex: [birth year] (64 y.o. F) Treating RN: Army Melia Primary Care Leina Babe: PATIENT, NO Other Clinician: Referring Blaize Nipper: Benjamine Sprague Treating Gregor Dershem/Extender: STONE III, HOYT Weeks in Treatment: 0 Allergies Active Allergies penicillin Allergy Notes Electronic Signature(s) Signed: 05/16/2019 11:38:50 AM By: Army Melia Entered By: Army Melia on 05/16/2019 08:23:56 Standen, [name] ([id number]) -------------------------------------------------------------------------------- Arrival Information Details Patient Name: [name] Date of Service: 05/16/2019 8:00 AM Medical Record Number: [id number] Patient Account Number: [id number] Date of Birth/Sex: 1955/05/22 (63 y.o. F) Treating RN: Army Melia Primary Care Dylann Gallier: PATIENT, NO Other Clinician: Referring Dalaney Needle: Benjamine Sprague Treating Theseus Birnie/Extender: Melburn Hake, HOYT Weeks in Treatment: 0 Visit Information Patient Arrived: Ambulatory Arrival Time: 08:14 Accompanied By: self Transfer Assistance: None Patient Identification Verified: Yes Electronic Signature(s) Signed: 05/16/2019 11:38:50 AM By: Army Melia Entered By: Army Melia on 05/16/2019 08:14:55 Broers, [name] ([id number]) -------------------------------------------------------------------------------- Clinic Level of Care Assessment Details Patient Name: [name] Date of Service: 05/16/2019 8:00 AM Medical Record Number: [id number] Patient Account Number: [id number] Date of Birth/Sex: 12-Feb-1956 (63 y.o. F) Treating RN: Montey Hora Primary Care Davonne Baby: PATIENT, NO Other Clinician: Referring Amayiah Gosnell: Benjamine Sprague Treating Damacio Weisgerber/Extender: STONE III, HOYT Weeks in Treatment: 0 Clinic Level of  Care Assessment Items TOOL 2 Quantity Score '[]'  - Use when only an EandM is performed on the INITIAL visit 0 ASSESSMENTS - Nursing Assessment / Reassessment X - General Physical Exam (combine w/ comprehensive assessment (listed just below) when 1 20 performed on new pt. evals) X- 1 25 Comprehensive Assessment (HX, ROS, Risk Assessments, Wounds Hx, etc.) ASSESSMENTS - Wound and Skin Assessment / Reassessment X - Simple Wound Assessment / Reassessment - one wound 1 5 '[]'  - 0 Complex Wound Assessment / Reassessment - multiple wounds '[]'  - 0 Dermatologic / Skin Assessment (not related to wound area) ASSESSMENTS - Ostomy and/or Continence Assessment and Care '[]'  - Incontinence Assessment and Management 0 '[]'  - 0 Ostomy Care Assessment and Management (repouching, etc.) PROCESS - Coordination of Care X - Simple Patient / Family Education for ongoing care 1 15 '[]'  - 0 Complex (extensive) Patient / Family Education for ongoing care X- 1 10 Staff obtains Programmer, systems, Records, Test Results / Process Orders '[]'  - 0 Staff telephones HHA, Nursing Homes / Clarify orders / etc '[]'  - 0 Routine Transfer to another Facility (non-emergent condition) '[]'  - 0 Routine Hospital Admission (non-emergent condition) X- 1 15 New Admissions / Biomedical engineer / Ordering NPWT, Apligraf, etc. '[]'  - 0 Emergency Hospital Admission (emergent condition) X- 1 10 Simple Discharge Coordination '[]'  - 0 Complex (extensive) Discharge Coordination PROCESS - Special Needs '[]'  - Pediatric / Minor Patient Management 0 '[]'  - 0 Isolation Patient Management Zeidman, Ife ([id number]) '[]'  - 0 Hearing / Language / Visual special needs '[]'  - 0 Assessment of Community assistance (transportation, D/C planning, etc.) '[]'  - 0 Additional assistance / Altered mentation '[]'  - 0 Support Surface(s) Assessment (bed, cushion, seat, etc.) INTERVENTIONS - Wound Cleansing / Measurement X - Wound Imaging (photographs - any number of  wounds) 1 5 '[]'  - 0 Wound Tracing (instead of photographs) X- 1 5 Simple Wound Measurement - one wound '[]'  - 0 Complex Wound Measurement - multiple wounds X- 1 5 Simple Wound Cleansing - one wound '[]'  - 0 Complex Wound Cleansing - multiple wounds INTERVENTIONS - Wound Dressings X - Small Wound Dressing one  or multiple wounds 1 10 '[]'  - 0 Medium Wound Dressing one or multiple wounds '[]'  - 0 Large Wound Dressing one or multiple wounds '[]'  - 0 Application of Medications - injection INTERVENTIONS - Miscellaneous '[]'  - External ear exam 0 '[]'  - 0 Specimen Collection (cultures, biopsies, blood, body fluids, etc.) '[]'  - 0 Specimen(s) / Culture(s) sent or taken to Lab for analysis '[]'  - 0 Patient Transfer (multiple staff / Civil Service fast streamer / Similar devices) '[]'  - 0 Simple Staple / Suture removal (25 or less) '[]'  - 0 Complex Staple / Suture removal (26 or more) '[]'  - 0 Hypo / Hyperglycemic Management (close monitor of Blood Glucose) '[]'  - 0 Ankle / Brachial Index (ABI) - do not check if billed separately Has the patient been seen at the hospital within the last three years: Yes Total Score: 125 Level Of Care: New/Established - Level 4 Electronic Signature(s) Signed: 05/16/2019 12:14:17 PM By: Montey Hora Entered By: Montey Hora on 05/16/2019 08:51:16 Gleaves, [name] ([id number]) -------------------------------------------------------------------------------- Encounter Discharge Information Details Patient Name: [name] Date of Service: 05/16/2019 8:00 AM Medical Record Number: [id number] Patient Account Number: [id number] Date of Birth/Sex: [birth year] (63 y.o. F) Treating RN: Montey Hora Primary Care Suprena Travaglini: PATIENT, NO Other Clinician: Referring Jadwiga Faidley: Benjamine Sprague Treating Vivica Dobosz/Extender: Melburn Hake, HOYT Weeks in Treatment: 0 Encounter Discharge Information Items Discharge Condition: Stable Ambulatory Status: Ambulatory Discharge Destination:  Home Transportation: Private Auto Accompanied By: self Schedule Follow-up Appointment: Yes Clinical Summary of Care: Electronic Signature(s) Signed: 05/16/2019 12:14:17 PM By: Montey Hora Entered By: Montey Hora on 05/16/2019 08:54:52 Furey, [name] ([id number]) -------------------------------------------------------------------------------- Lower Extremity Assessment Details Patient Name: [name] Date of Service: 05/16/2019 8:00 AM Medical Record Number: [id number] Patient Account Number: [id number] Date of Birth/Sex: 08-06-1955 (63 y.o. F) Treating RN: Army Melia Primary Care Aleyda Gindlesperger: PATIENT, NO Other Clinician: Referring Kandas Oliveto: Benjamine Sprague Treating Shavaun Osterloh/Extender: Melburn Hake, HOYT Weeks in Treatment: 0 Electronic Signature(s) Signed: 05/16/2019 11:38:50 AM By: Army Melia Entered By: Army Melia on 05/16/2019 08:23:49 Rengel, Randa ([id number]) -------------------------------------------------------------------------------- Multi Wound Chart Details Patient Name: [name] Date of Service: 05/16/2019 8:00 AM Medical Record Number: [id number] Patient Account Number: [id number] Date of Birth/Sex: Oct 24, 1955 (63 y.o. F) Treating RN: Montey Hora Primary Care Rosangelica Pevehouse: PATIENT, NO Other Clinician: Referring Brack Shaddock: Benjamine Sprague Treating Adi Seales/Extender: STONE III, HOYT Weeks in Treatment: 0 Vital Signs Height(in): 64 Pulse(bpm): 26 Weight(lbs): 190 Blood Pressure(mmHg): 147/83 Body Mass Index(BMI): 33 Temperature(F): 97.9 Respiratory Rate 16 (breaths/min): Photos: [N/A:N/A] Wound Location: Right Axilla N/A N/A Wounding Event: Gradually Appeared N/A N/A Primary Etiology: Abscess N/A N/A Comorbid History: Cataracts, Chronic Obstructive N/A N/A Pulmonary Disease (COPD), Hypertension, Myocardial Infarction Date Acquired: 05/02/2019 N/A N/A Weeks of Treatment: 0 N/A N/A Wound Status: Open N/A N/A Measurements L x W x D  0.4x4x2.8 N/A N/A (cm) Area (cm) : 1.257 N/A N/A Volume (cm) : 3.519 N/A N/A % Reduction in Area: 0.00% N/A N/A % Reduction in Volume: 0.00% N/A N/A Position 1 (o'clock): 2 Maximum Distance 1 (cm): 6.2 Starting Position 1 12 (o'clock): Ending Position 1 12 (o'clock): Maximum Distance 1 (cm): 0.2 Tunneling: Yes N/A N/A Undermining: Yes N/A N/A Classification: Partial Thickness N/A N/A Exudate Amount: Large N/A N/A Exudate Type: Serosanguineous N/A N/A Exudate Color: red, brown N/A N/A Grewe, Alonzo ([id number]) Granulation Amount: Large (67-100%) N/A N/A Granulation Quality: Red N/A N/A Necrotic Amount: Small (1-33%) N/A N/A Exposed Structures: Fat Layer (Subcutaneous N/A N/A Tissue) Exposed: Yes Fascia: No Tendon: No Muscle: No Joint: No Bone: No Epithelialization: None  N/A N/A Treatment Notes Electronic Signature(s) Signed: 05/16/2019 12:14:17 PM By: Montey Hora Entered By: Montey Hora on 05/16/2019 08:44:56 Ryser, [name] ([id number]) -------------------------------------------------------------------------------- Multi-Disciplinary Care Plan Details Patient Name: [name] Date of Service: 05/16/2019 8:00 AM Medical Record Number: [id number] Patient Account Number: [id number] Date of Birth/Sex: 1955-05-18 (63 y.o. F) Treating RN: Montey Hora Primary Care Lendon George: PATIENT, NO Other Clinician: Referring Lelania Bia: Benjamine Sprague Treating Fallyn Munnerlyn/Extender: Melburn Hake, HOYT Weeks in Treatment: 0 Active Inactive Abuse / Safety / Falls / Self Care Management Nursing Diagnoses: Potential for falls Goals: Patient will remain injury free related to falls Date Initiated: 05/16/2019 Target Resolution Date: 08/09/2019 Goal Status: Active Interventions: Assess fall risk on admission and as needed Notes: Orientation to the Wound Care Program Nursing Diagnoses: Knowledge deficit related to the wound healing center  program Goals: Patient/caregiver will verbalize understanding of the [facility] Program Date Initiated: 05/16/2019 Target Resolution Date: 08/09/2019 Goal Status: Active Interventions: Provide education on orientation to the wound center Notes: Pain, Acute or Chronic Nursing Diagnoses: Pain, acute or chronic: actual or potential Goals: Patient/caregiver will verbalize comfort level met Date Initiated: 05/16/2019 Target Resolution Date: 08/09/2019 Goal Status: Active Interventions: Complete pain assessment as per visit requirements Kaczmarczyk, Hadassah ([id number]) Notes: Soft Tissue Infection Nursing Diagnoses: Impaired tissue integrity Goals: Patient will remain free of wound infection Date Initiated: 05/16/2019 Target Resolution Date: 08/09/2019 Goal Status: Active Interventions: Assess signs and symptoms of infection every visit Notes: Wound/Skin Impairment Nursing Diagnoses: Impaired tissue integrity Goals: Ulcer/skin breakdown will heal within 14 weeks Date Initiated: 05/16/2019 Target Resolution Date: 08/09/2019 Goal Status: Active Interventions: Assess patient/caregiver ability to obtain necessary supplies Assess patient/caregiver ability to perform ulcer/skin care regimen upon admission and as needed Assess ulceration(s) every visit Notes: Electronic Signature(s) Signed: 05/16/2019 12:14:17 PM By: Montey Hora Entered By: Montey Hora on 05/16/2019 08:44:46 Gatson, [name] ([id number]) -------------------------------------------------------------------------------- Pain Assessment Details Patient Name: [name] Date of Service: 05/16/2019 8:00 AM Medical Record Number: [id number] Patient Account Number: [id number] Date of Birth/Sex: Jul 03, 1955 (63 y.o. F) Treating RN: Army Melia Primary Care Averyanna Sax: PATIENT, NO Other Clinician: Referring Marciana Uplinger: Benjamine Sprague Treating Desaray Marschner/Extender: STONE III, HOYT Weeks in Treatment: 0 Active  Problems Location of Pain Severity and Description of Pain Patient Has Paino No Site Locations Pain Management and Medication Current Pain Management: Electronic Signature(s) Signed: 05/16/2019 11:38:50 AM By: Army Melia Entered By: Army Melia on 05/16/2019 08:14:59 Lazalde, [name] ([id number]) -------------------------------------------------------------------------------- Patient/Caregiver Education Details Patient Name: [name] Date of Service: 05/16/2019 8:00 AM Medical Record Number: [id number] Patient Account Number: [id number] Date of Birth/Gender: Jan 09, 1956 (63 y.o. F) Treating RN: Montey Hora Primary Care Physician: PATIENT, NO Other Clinician: Referring Physician: Benjamine Sprague Treating Physician/Extender: Melburn Hake, HOYT Weeks in Treatment: 0 Education Assessment Education Provided To: Patient Education Topics Provided Wound/Skin Impairment: Handouts: Other: wound care as ordered Methods: Explain/Verbal Responses: State content correctly Electronic Signature(s) Signed: 05/16/2019 12:14:17 PM By: Montey Hora Entered By: Montey Hora on 05/16/2019 08:51:52 Ciaramitaro, [name] ([id number]) -------------------------------------------------------------------------------- Wound Assessment Details Patient Name: [name] Date of Service: 05/16/2019 8:00 AM Medical Record Number: [id number] Patient Account Number: [id number] Date of Birth/Sex: 1955/08/31 (63 y.o. F) Treating RN: Army Melia Primary Care Kenita Bines: PATIENT, NO Other Clinician: Referring Marqueze Ramcharan: Benjamine Sprague Treating Luciel Brickman/Extender: STONE III, HOYT Weeks in Treatment: 0 Wound Status Wound Number: 1 Primary Abscess Etiology: Wound Location: Right Axilla Wound Open Wounding Event: Gradually Appeared Status: Date Acquired: 05/02/2019 Comorbid Cataracts, Chronic Obstructive Pulmonary Weeks Of Treatment: 0 History: Disease (COPD), Hypertension, Myocardial Clustered  Wound: No Infarction Photos Wound Measurements Length: (cm) 0.4 % Reduction in Width: (cm) 4 % Reduction in Depth: (cm) 2.8 Epithelializati Area: (cm) 1.257 Tunneling: Volume: (cm) 3.519 Position (o Maximum Dist Area: 0% Volume: 0% on: None Yes 'clock): 2 ance: (cm) 6.2 Undermining: Yes Starting Position (o'clock): 12 Ending Position (o'clock): 12 Maximum Distance: (cm) 0.2 Wound Description Classification: Partial Thickness Foul Odor After Exudate Amount: Large Slough/Fibrino Exudate Type: Serosanguineous Exudate Color: red, brown Cleansing: No Yes Wound Bed Granulation Amount: Large (67-100%) Exposed Structure Granulation Quality: Red Fascia Exposed: No Necrotic Amount: Small (1-33%) Fat Layer (Subcutaneous Tissue) Exposed: Yes Necrotic Quality: Adherent Slough Tendon Exposed: No Ducat, Gordie ([id number]) Muscle Exposed: No Joint Exposed: No Bone Exposed: No Treatment Notes Wound #1 (Right Axilla) Notes silvercel, BFD Electronic Signature(s) Signed: 05/16/2019 11:38:50 AM By: Army Melia Entered By: Army Melia on 05/16/2019 08:33:22 Hollings, [name] ([id number]) -------------------------------------------------------------------------------- Vitals Details Patient Name: [name] Date of Service: 05/16/2019 8:00 AM Medical Record Number: [id number] Patient Account Number: [id number] Date of Birth/Sex: 1955-12-11 (64 y.o. F) Treating RN: Army Melia Primary Care Kiondra Caicedo: PATIENT, NO Other Clinician: Referring Clayton Jarmon: Benjamine Sprague Treating Jaleeya Mcnelly/Extender: STONE III, HOYT Weeks in Treatment: 0 Vital Signs Time Taken: 08:15 Temperature (F): 97.9 Height (in): 64 Pulse (bpm): 62 Source: Stated Respiratory Rate (breaths/min): 16 Weight (lbs): 190 Blood Pressure (mmHg): 147/83 Source: Stated Reference Range: 80 - 120 mg / dl Body Mass Index (BMI): 32.6 Electronic Signature(s) Signed: 05/16/2019 11:38:50 AM By: Army Melia Entered By: Army Melia on 05/16/2019 08:15:22

## 2019-05-16 NOTE — Progress Notes (Signed)
CEAZIA, HARB ([id number]) Visit Report for 05/16/2019 Chief Complaint Document Details Patient Name: [name], [name] Date of Service: 05/16/2019 8:00 AM Medical Record Number: [id number] Patient Account Number: [id number] Date of Birth/Sex: 27-Apr-1956 (64 y.o. F) Treating RN: Curtis Sites Primary Care Provider: PATIENT, NO Other Clinician: Referring Provider: Sung Amabile Treating Provider/Extender: Linwood Dibbles, Tommie Dejoseph Weeks in Treatment: 0 Information Obtained from: Patient Chief Complaint Right Axilla abscess and ulcer Electronic Signature(s) Signed: 05/16/2019 8:40:38 AM By: Lenda Kelp PA-C Entered By: Lenda Kelp on 05/16/2019 08:40:38 [name], [name] ([id number]) -------------------------------------------------------------------------------- HPI Details Patient Name: [name] Date of Service: 05/16/2019 8:00 AM Medical Record Number: [id number] Patient Account Number: [id number] Date of Birth/Sex: [birth year] (63 y.o. F) Treating RN: Curtis Sites Primary Care Provider: PATIENT, NO Other Clinician: Referring Provider: Sung Amabile Treating Provider/Extender: Linwood Dibbles, Lorriane Dehart Weeks in Treatment: 0 History of Present Illness HPI Description: 05/16/2019 patient presents today for initial evaluation in our clinic concerning a abscess she had in the right axillary region. This was subsequently evaluated by Dr. Tonna Boehringer in the hospital and he did have to open this up in order to drain the abscess. Subsequent to that it was recommended that the patient have this packed on a regular basis with saline wet-to- dry dressings. Unfortunately the patient does not have anyone who can really help her with this. Only her son is apparently in the picture and she states that he would "pass out" if he were to have to do this. Nonetheless she has been in a very tight spot she actually is gone back to the hospital to have this changed at one point and then subsequently they  recommended she go back to the surgeon Dr. Tonna Boehringer actually referred her to [place] to help manage the patient's wound. With that being said currently she states that she does not have any discomfort except for if the area is being packed with the packing is removed. She does have a history of hypertension but currently states that that is typically under pretty good control. With regard to the wound itself I do believe that this is something that is going to need to be packed on a more regular basis. This may and tell us having to do this some here in the clinic and then potentially getting her home health as well if were able to do so. I do think it is good to be of utmost importance however to keep this packing changed if at all possible daily although weekends are good to be difficult no matter what. No fevers, chills, nausea, vomiting, or diarrhea. Electronic Signature(s) Signed: 05/16/2019 12:19:12 PM By: Lenda Kelp PA-C Entered By: Lenda Kelp on 05/16/2019 12:19:12 [name], [name] ([id number]) -------------------------------------------------------------------------------- Physical Exam Details Patient Name: [name] Date of Service: 05/16/2019 8:00 AM Medical Record Number: [id number] Patient Account Number: [id number] Date of Birth/Sex: February 16, 1956 (63 y.o. F) Treating RN: Curtis Sites Primary Care Provider: PATIENT, NO Other Clinician: Referring Provider: Sung Amabile Treating Provider/Extender: STONE III, Samwise Eckardt Weeks in Treatment: 0 Constitutional patient is hypertensive.. pulse regular and within target range for patient.[name] respirations regular, non-labored and within target range for patient.[name] temperature within target range for patient.. Well-nourished and well-hydrated in no acute distress. Eyes conjunctiva clear no eyelid edema noted. pupils equal round and reactive to light and accommodation. Ears, Nose, Mouth, and Throat no gross abnormality of ear auricles  or external auditory canals. normal hearing noted during conversation. mucus membranes moist. Respiratory normal breathing without difficulty. Cardiovascular no clubbing, cyanosis,  significant edema, <3 sec cap refill. Gastrointestinal (GI) soft, non-tender, non-distended, +BS. no ventral hernia noted. Musculoskeletal normal gait and posture. no significant deformity or arthritic changes, no loss or range of motion, no clubbing. Psychiatric this patient is able to make decisions and demonstrates good insight into disease process. Alert and Oriented x 3. pleasant and cooperative. Notes Upon inspection today patient's wound currently shows the incision line and then internally is open into a larger cavity and she does have some undermining along with a tunnel as well that was 6 cm in length. Nonetheless overall I do feel like that this is doing okay and right now I am not seeing any signs of significant infection at this point surrounding as far as cellulitis is concerned she has been on antibiotics she is almost done with those but still does have antibiotics currently as well. With that being said I do not see any signs of tracking or tunneling any other location that is going to be a problem that I can tell at least with probing around the base of the wound. Electronic Signature(s) Signed: 05/16/2019 12:20:08 PM By: Lenda Kelp PA-C Entered By: Lenda Kelp on 05/16/2019 12:20:07 [name], [name] ([id number]) -------------------------------------------------------------------------------- Physician Orders Details Patient Name: [name] Date of Service: 05/16/2019 8:00 AM Medical Record Number: [id number] Patient Account Number: [id number] Date of Birth/Sex: 12/08/1955 (63 y.o. F) Treating RN: Curtis Sites Primary Care Provider: PATIENT, NO Other Clinician: Referring Provider: Sung Amabile Treating Provider/Extender: STONE III, Markeisha Mancias Weeks in Treatment: 0 Verbal /  Phone Orders: No Diagnosis Coding ICD-10 Coding Code Description L02.411 Cutaneous abscess of right axilla L98.492 Non-pressure chronic ulcer of skin of other sites with fat layer exposed I10 Essential (primary) hypertension Wound Cleansing Wound #1 Right Axilla o Clean wound with Normal Saline. o May shower with protection. - Do not get your wound wet Primary Wound Dressing Wound #1 Right Axilla o Silver Alginate Secondary Dressing Wound #1 Right Axilla o Boardered Foam Dressing o Drawtex Dressing Change Frequency Wound #1 Right Axilla o Change dressing every day. - Mondays, Wednesdays and Fridays in clinic Follow-up Appointments o Return Appointment in 1 week. o Nurse Visit as needed Home Health Wound #1 Right Axilla o Initiate Home Health for Skilled Nursing o Home Health Nurse may visit PRN to address patientos wound care needs. o FACE TO FACE ENCOUNTER: MEDICARE and MEDICAID PATIENTS: I certify that this patient is under my care and that I had a face-to-face encounter that meets the physician face-to-face encounter requirements with this patient on this date. The encounter with the patient was in whole or in part for the following MEDICAL CONDITION: (primary reason for Home Healthcare) MEDICAL NECESSITY: I certify, that based on my findings, NURSING services are a medically necessary home health service. HOME BOUND STATUS: I certify that my clinical findings support that this patient is homebound (i.e., Due to illness or injury, pt requires aid of supportive devices such as crutches, cane, wheelchairs, walkers, the use of special transportation or the assistance of another person to leave their place of residence. There is a normal inability to leave the home and doing so requires considerable and taxing effort. Other absences are for medical reasons / religious services and are infrequent or of short duration when for other reasons). Slater,  [name] ([id number]) o If current dressing causes regression in wound condition, may D/C ordered dressing product/s and apply Normal Saline Moist Dressing daily until next Wound Healing Center / Other MD appointment. Notify Wound  Healing Center of regression in wound condition at [phone]. o Please direct any NON-WOUND related issues/requests for orders to patient's Primary Care Physician Electronic Signature(s) Signed: 05/16/2019 12:14:17 PM By: Curtis Sites Signed: 05/16/2019 12:29:09 PM By: Lenda Kelp PA-C Entered By: Curtis Sites on 05/16/2019 08:54:36 [name], [name] ([id number]) -------------------------------------------------------------------------------- Problem List Details Patient Name: [name] Date of Service: 05/16/2019 8:00 AM Medical Record Number: [id number] Patient Account Number: [id number] Date of Birth/Sex: October 06, 1955 (63 y.o. F) Treating RN: Curtis Sites Primary Care Provider: PATIENT, NO Other Clinician: Referring Provider: Sung Amabile Treating Provider/Extender: Linwood Dibbles, Ethlyn Alto Weeks in Treatment: 0 Active Problems ICD-10 Evaluated Encounter Code Description Active Date Today Diagnosis L02.411 Cutaneous abscess of right axilla 05/16/2019 No Yes L98.492 Non-pressure chronic ulcer of skin of other sites with fat layer 05/16/2019 No Yes exposed I10 Essential (primary) hypertension 05/16/2019 No Yes Inactive Problems Resolved Problems Electronic Signature(s) Signed: 05/16/2019 8:40:13 AM By: Lenda Kelp PA-C Entered By: Lenda Kelp on 05/16/2019 08:40:12 [name], [name] ([id number]) -------------------------------------------------------------------------------- Progress Note Details Patient Name: [name] Date of Service: 05/16/2019 8:00 AM Medical Record Number: [id number] Patient Account Number: [id number] Date of Birth/Sex: 1956-04-30 (63 y.o. F) Treating RN: Curtis Sites Primary Care Provider: PATIENT, NO  Other Clinician: Referring Provider: Sung Amabile Treating Provider/Extender: Linwood Dibbles, Ramsey Midgett Weeks in Treatment: 0 Subjective Chief Complaint Information obtained from Patient Right Axilla abscess and ulcer History of Present Illness (HPI) 05/16/2019 patient presents today for initial evaluation in our clinic concerning a abscess she had in the right axillary region. This was subsequently evaluated by Dr. Tonna Boehringer in the hospital and he did have to open this up in order to drain the abscess. Subsequent to that it was recommended that the patient have this packed on a regular basis with saline wet-to-dry dressings. Unfortunately the patient does not have anyone who can really help her with this. Only her son is apparently in the picture and she states that he would "pass out" if he were to have to do this. Nonetheless she has been in a very tight spot she actually is gone back to the hospital to have this changed at one point and then subsequently they recommended she go back to the surgeon Dr. Tonna Boehringer actually referred her to [place] to help manage the patient's wound. With that being said currently she states that she does not have any discomfort except for if the area is being packed with the packing is removed. She does have a history of hypertension but currently states that that is typically under pretty good control. With regard to the wound itself I do believe that this is something that is going to need to be packed on a more regular basis. This may and tell us having to do this some here in the clinic and then potentially getting her home health as well if were able to do so. I do think it is good to be of utmost importance however to keep this packing changed if at all possible daily although weekends are good to be difficult no matter what. No fevers, chills, nausea, vomiting, or diarrhea. Patient History Information obtained from Patient. Allergies penicillin Family History Cancer -  Siblings,Mother, Heart Disease - Mother, Hypertension - Siblings, No family history of Diabetes, Kidney Disease, Lung Disease, Seizures, Stroke, Thyroid Problems, Tuberculosis. Social History Current every day smoker - 2 cigarettes a day 50 years, Marital Status - Widowed, Alcohol Use - Never, Drug Use - No History, Caffeine Use - Daily. Medical History  Eyes Patient has history of Cataracts - removed Denies history of Glaucoma, Optic Neuritis Ear/Nose/Mouth/Throat Denies history of Chronic sinus problems/congestion, Middle ear problems Hematologic/Lymphatic Denies history of Anemia, Hemophilia, Human Immunodeficiency Virus, Lymphedema, Sickle Cell Disease Respiratory Patient has history of Chronic Obstructive Pulmonary Disease (COPD) Denies history of Aspiration, Asthma, Pneumothorax, Sleep Apnea, Tuberculosis Gangl, Abcde ([id number]) Cardiovascular Patient has history of Hypertension, Myocardial Infarction Denies history of Angina, Arrhythmia, Congestive Heart Failure, Coronary Artery Disease, Deep Vein Thrombosis, Hypotension, Peripheral Arterial Disease, Peripheral Venous Disease, Phlebitis, Vasculitis Gastrointestinal Denies history of Cirrhosis , Colitis, Crohn s, Hepatitis A, Hepatitis B, Hepatitis C Endocrine Denies history of Type I Diabetes, Type II Diabetes Genitourinary Denies history of End Stage Renal Disease Immunological Denies history of Lupus Erythematosus, Raynaud s, Scleroderma Integumentary (Skin) Denies history of History of Burn, History of pressure wounds Musculoskeletal Denies history of Gout, Rheumatoid Arthritis, Osteoarthritis, Osteomyelitis Neurologic Denies history of Dementia, Neuropathy, Quadriplegia, Paraplegia, Seizure Disorder Oncologic Denies history of Received Chemotherapy, Received Radiation Psychiatric Denies history of Anorexia/bulimia, Confinement Anxiety Hospitalization/Surgery History - [facility] 05/06/19. Review of Systems  (ROS) Constitutional Symptoms (General Health) Denies complaints or symptoms of Fatigue, Fever, Chills, Marked Weight Change. Eyes Complains or has symptoms of Glasses / Contacts - glasses. Denies complaints or symptoms of Dry Eyes, Vision Changes. Ear/Nose/Mouth/Throat Denies complaints or symptoms of Difficult clearing ears, Sinusitis. Hematologic/Lymphatic Denies complaints or symptoms of Bleeding / Clotting Disorders, Human Immunodeficiency Virus. Respiratory Denies complaints or symptoms of Chronic or frequent coughs, Shortness of Breath. Cardiovascular Denies complaints or symptoms of Chest pain, LE edema. Gastrointestinal Denies complaints or symptoms of Frequent diarrhea, Nausea, Vomiting. Endocrine Denies complaints or symptoms of Hepatitis, Thyroid disease, Polydypsia (Excessive Thirst). Genitourinary Denies complaints or symptoms of Kidney failure/ Dialysis, Incontinence/dribbling. Immunological Denies complaints or symptoms of Hives, Itching. Integumentary (Skin) Complains or has symptoms of Wounds. Denies complaints or symptoms of Bleeding or bruising tendency, Breakdown, Swelling. Musculoskeletal Denies complaints or symptoms of Muscle Pain, Muscle Weakness. Neurologic Denies complaints or symptoms of Numbness/parasthesias, Focal/Weakness. Psychiatric Denies complaints or symptoms of Anxiety, Claustrophobia. Clonch, Vallory ([id number]) Objective Constitutional patient is hypertensive.. pulse regular and within target range for patient.[name] respirations regular, non-labored and within target range for patient.[name] temperature within target range for patient.. Well-nourished and well-hydrated in no acute distress. Vitals Time Taken: 8:15 AM, Height: 64 in, Source: Stated, Weight: 190 lbs, Source: Stated, BMI: 32.6, Temperature: 97.9 F, Pulse: 62 bpm, Respiratory Rate: 16 breaths/min, Blood Pressure: 147/83 mmHg. Eyes conjunctiva clear no eyelid edema noted. pupils  equal round and reactive to light and accommodation. Ears, Nose, Mouth, and Throat no gross abnormality of ear auricles or external auditory canals. normal hearing noted during conversation. mucus membranes moist. Respiratory normal breathing without difficulty. Cardiovascular no clubbing, cyanosis, significant edema, Gastrointestinal (GI) soft, non-tender, non-distended, +BS. no ventral hernia noted. Musculoskeletal normal gait and posture. no significant deformity or arthritic changes, no loss or range of motion, no clubbing. Psychiatric this patient is able to make decisions and demonstrates good insight into disease process. Alert and Oriented x 3. pleasant and cooperative. General Notes: Upon inspection today patient's wound currently shows the incision line and then internally is open into a larger cavity and she does have some undermining along with a tunnel as well that was 6 cm in length. Nonetheless overall I do feel like that this is doing okay and right now I am not seeing any signs of significant infection at this point surrounding as far as cellulitis is concerned she has  been on antibiotics she is almost done with those but still does have antibiotics currently as well. With that being said I do not see any signs of tracking or tunneling any other location that is going to be a problem that I can tell at least with probing around the base of the wound. Integumentary (Hair, Skin) Wound #1 status is Open. Original cause of wound was Gradually Appeared. The wound is located on the Right Axilla. The wound measures 0.4cm length x 4cm width x 2.8cm depth; 1.257cm^2 area and 3.519cm^3 volume. There is Fat Layer (Subcutaneous Tissue) Exposed exposed. Tunneling has been noted at 2:00 with a maximum distance of 6.2cm. Undermining begins at 12:00 and ends at 12:00 with a maximum distance of 0.2cm. There is a large amount of serosanguineous drainage noted. There is large (67-100%) red  granulation within the wound bed. There is a small (1-33%) amount of necrotic tissue within the wound bed including Adherent Slough. Gortney, [name] ([id number]) Assessment Active Problems ICD-10 Cutaneous abscess of right axilla Non-pressure chronic ulcer of skin of other sites with fat layer exposed Essential (primary) hypertension Plan Wound Cleansing: Wound #1 Right Axilla: Clean wound with Normal Saline. May shower with protection. - Do not get your wound wet Primary Wound Dressing: Wound #1 Right Axilla: Silver Alginate Secondary Dressing: Wound #1 Right Axilla: Boardered Foam Dressing Drawtex Dressing Change Frequency: Wound #1 Right Axilla: Change dressing every day. - Mondays, Wednesdays and Fridays in clinic Follow-up Appointments: Return Appointment in 1 week. Nurse Visit as needed Home Health: Wound #1 Right Axilla: Initiate Home Health for Skilled Nursing Home Health Nurse may visit PRN to address patient s wound care needs. FACE TO FACE ENCOUNTER: MEDICARE and MEDICAID PATIENTS: I certify that this patient is under my care and that I had a face-to-face encounter that meets the physician face-to-face encounter requirements with this patient on this date. The encounter with the patient was in whole or in part for the following MEDICAL CONDITION: (primary reason for Home Healthcare) MEDICAL NECESSITY: I certify, that based on my findings, NURSING services are a medically necessary home health service. HOME BOUND STATUS: I certify that my clinical findings support that this patient is homebound (i.e., Due to illness or injury, pt requires aid of supportive devices such as crutches, cane, wheelchairs, walkers, the use of special transportation or the assistance of another person to leave their place of residence. There is a normal inability to leave the home and doing so requires considerable and taxing effort. Other absences are for medical reasons / religious  services and are infrequent or of short duration when for other reasons). If current dressing causes regression in wound condition, may D/C ordered dressing product/s and apply Normal Saline Moist Dressing daily until next Wound Healing Center / Other MD appointment. Notify Wound Healing Center of regression in wound condition at [phone]. Please direct any NON-WOUND related issues/requests for orders to patient's Primary Care Physician 1. My suggestion at this point based on what I am seeing is good to be that we go ahead and initiate treatment with a silver alginate dressing packed into the right axillary region. 2. I would recommend over top of this that we apply drawtex to help with absorption as well. This will be covered with a border foam dressing. [name], [name] ([id number]) 3. I am also going to suggest that she continue with her oral antibiotics that she has been taking up to this point. I think that still appropriate and she should continue  that if we need to extend anything I will do so but not today I will defer see just how things do since she actually seems to be doing quite well. 4. We will get an attempt to get a home health nurse to come out to help her as far as her dressing changes are concerned so we can have this changed at least most days a week. We will see what we can do again some of this may depend on her insurance. We will see patient back for reevaluation in 1 week here in the clinic. If anything worsens or changes patient will contact our office for additional recommendations. Electronic Signature(s) Signed: 05/16/2019 12:21:23 PM By: Worthy Keeler PA-C Entered By: Worthy Keeler on 05/16/2019 12:21:23 [name], [name] ([id number]) -------------------------------------------------------------------------------- ROS/PFSH Details Patient Name: [name] Date of Service: 05/16/2019 8:00 AM Medical Record Number: [id number] Patient Account  Number: [id number] Date of Birth/Sex: 1955/12/26 (63 y.o. F) Treating RN: Army Melia Primary Care Provider: PATIENT, NO Other Clinician: Referring Provider: Benjamine Sprague Treating Provider/Extender: STONE III, Maripaz Mullan Weeks in Treatment: 0 Information Obtained From Patient Constitutional Symptoms (General Health) Complaints and Symptoms: Negative for: Fatigue; Fever; Chills; Marked Weight Change Eyes Complaints and Symptoms: Positive for: Glasses / Contacts - glasses Negative for: Dry Eyes; Vision Changes Medical History: Positive for: Cataracts - removed Negative for: Glaucoma; Optic Neuritis Ear/Nose/Mouth/Throat Complaints and Symptoms: Negative for: Difficult clearing ears; Sinusitis Medical History: Negative for: Chronic sinus problems/congestion; Middle ear problems Hematologic/Lymphatic Complaints and Symptoms: Negative for: Bleeding / Clotting Disorders; Human Immunodeficiency Virus Medical History: Negative for: Anemia; Hemophilia; Human Immunodeficiency Virus; Lymphedema; Sickle Cell Disease Respiratory Complaints and Symptoms: Negative for: Chronic or frequent coughs; Shortness of Breath Medical History: Positive for: Chronic Obstructive Pulmonary Disease (COPD) Negative for: Aspiration; Asthma; Pneumothorax; Sleep Apnea; Tuberculosis Cardiovascular Complaints and Symptoms: Negative for: Chest pain; LE edema Medical History: Positive for: Hypertension; Myocardial Infarction Negative for: Angina; Arrhythmia; Congestive Heart Failure; Coronary Artery Disease; Deep Vein Thrombosis; Hypotension; Cicero, Grettell ([id number]) Peripheral Arterial Disease; Peripheral Venous Disease; Phlebitis; Vasculitis Gastrointestinal Complaints and Symptoms: Negative for: Frequent diarrhea; Nausea; Vomiting Medical History: Negative for: Cirrhosis ; Colitis; Crohnos; Hepatitis A; Hepatitis B; Hepatitis C Endocrine Complaints and Symptoms: Negative for: Hepatitis; Thyroid  disease; Polydypsia (Excessive Thirst) Medical History: Negative for: Type I Diabetes; Type II Diabetes Genitourinary Complaints and Symptoms: Negative for: Kidney failure/ Dialysis; Incontinence/dribbling Medical History: Negative for: End Stage Renal Disease Immunological Complaints and Symptoms: Negative for: Hives; Itching Medical History: Negative for: Lupus Erythematosus; Raynaudos; Scleroderma Integumentary (Skin) Complaints and Symptoms: Positive for: Wounds Negative for: Bleeding or bruising tendency; Breakdown; Swelling Medical History: Negative for: History of Burn; History of pressure wounds Musculoskeletal Complaints and Symptoms: Negative for: Muscle Pain; Muscle Weakness Medical History: Negative for: Gout; Rheumatoid Arthritis; Osteoarthritis; Osteomyelitis Neurologic Complaints and Symptoms: Negative for: Numbness/parasthesias; Focal/Weakness Medical History: Negative for: Dementia; Neuropathy; Quadriplegia; Paraplegia; Seizure Disorder [name], [name] ([id number]) Psychiatric Complaints and Symptoms: Negative for: Anxiety; Claustrophobia Medical History: Negative for: Anorexia/bulimia; Confinement Anxiety Oncologic Medical History: Negative for: Received Chemotherapy; Received Radiation HBO Extended History Items Eyes: Cataracts Immunizations Pneumococcal Vaccine: Received Pneumococcal Vaccination: Yes Implantable Devices No devices added Hospitalization / Surgery History Type of Hospitalization/Surgery [facility] 05/06/19 Family and Social History Cancer: Yes - Siblings,Mother; Diabetes: No; Heart Disease: Yes - Mother; Hypertension: Yes - Siblings; Kidney Disease: No; Lung Disease: No; Seizures: No; Stroke: No; Thyroid Problems: No; Tuberculosis: No; Current every day smoker - 2 cigarettes a day 50 years; Marital Status -  Widowed; Alcohol Use: Never; Drug Use: No History; Caffeine Use: Daily; Financial Concerns: No; Food, Clothing or Shelter Needs:  No; Support System Lacking: No; Transportation Concerns: No Electronic Signature(s) Signed: 05/16/2019 11:38:50 AM By: Rodell Perna Signed: 05/16/2019 12:29:09 PM By: Lenda Kelp PA-C Entered By: Rodell Perna on 05/16/2019 08:30:55 Pulcini, [name] ([id number]) -------------------------------------------------------------------------------- SuperBill Details Patient Name: [name] Date of Service: 05/16/2019 Medical Record Number: [id number] Patient Account Number: [id number] Date of Birth/Sex: 1955-08-25 (64 y.o. F) Treating RN: Curtis Sites Primary Care Provider: PATIENT, NO Other Clinician: Referring Provider: Sung Amabile Treating Provider/Extender: STONE III, Dantae Meunier Weeks in Treatment: 0 Diagnosis Coding ICD-10 Codes Code Description L02.411 Cutaneous abscess of right axilla L98.492 Non-pressure chronic ulcer of skin of other sites with fat layer exposed I10 Essential (primary) hypertension Facility Procedures CPT4 Code: 46659935 Description: 99214 - WOUND CARE VISIT-LEV 4 EST PT Modifier: Quantity: 1 Physician Procedures CPT4 Code: 7017793 Description: 99204 - WC PHYS LEVEL 4 - NEW PT ICD-10 Diagnosis Description L02.411 Cutaneous abscess of right axilla L98.492 Non-pressure chronic ulcer of skin of other sites with fat l I10 Essential (primary) hypertension Modifier: ayer exposed Quantity: 1 Electronic Signature(s) Signed: 05/16/2019 12:21:37 PM By: Lenda Kelp PA-C Entered By: Lenda Kelp on 05/16/2019 12:21:37

## 2019-05-16 NOTE — Progress Notes (Signed)
RENAY, CRAMMER ([id number]) Visit Report for 05/16/2019 Abuse/Suicide Risk Screen Details Patient Name: [name], [name] Date of Service: 05/16/2019 8:00 AM Medical Record Number: [id number] Patient Account Number: [id number] Date of Birth/Sex: December 05, 1955 (64 y.o. F) Treating RN: Rodell Perna Primary Care Reanne Nellums: PATIENT, NO Other Clinician: Referring Charlton Boule: Sung Amabile Treating Cheyanne Lamison/Extender: STONE III, HOYT Weeks in Treatment: 0 Abuse/Suicide Risk Screen Items Answer ABUSE RISK SCREEN: Has anyone close to you tried to hurt or harm you recentlyo No Do you feel uncomfortable with anyone in your familyo No Has anyone forced you do things that you didnot want to doo No Electronic Signature(s) Signed: 05/16/2019 11:38:50 AM By: Rodell Perna Entered By: Rodell Perna on 05/16/2019 08:31:02 Sargent, [name] ([id number]) -------------------------------------------------------------------------------- Activities of Daily Living Details Patient Name: [name] Date of Service: 05/16/2019 8:00 AM Medical Record Number: [id number] Patient Account Number: [id number] Date of Birth/Sex: 12-04-1955 (64 y.o. F) Treating RN: Rodell Perna Primary Care Sola Margolis: PATIENT, NO Other Clinician: Referring Adreana Coull: Sung Amabile Treating Natalija Mavis/Extender: STONE III, HOYT Weeks in Treatment: 0 Activities of Daily Living Items Answer Activities of Daily Living (Please select one for each item) Drive Automobile Completely Able Take Medications Completely Able Use Telephone Completely Able Care for Appearance Completely Able Use Toilet Completely Able Bath / Shower Completely Able Dress Self Completely Able Feed Self Completely Able Walk Completely Able Get In / Out Bed Completely Able Housework Completely Able Prepare Meals Completely Able Handle Money Completely Able Shop for Self Completely Able Electronic Signature(s) Signed: 05/16/2019 11:38:50 AM By: Rodell Perna Entered By: Rodell Perna on 05/16/2019 08:31:14 Peak, [name] ([id number]) -------------------------------------------------------------------------------- Education Screening Details Patient Name: [name] Date of Service: 05/16/2019 8:00 AM Medical Record Number: [id number] Patient Account Number: [id number] Date of Birth/Sex: [birth year] (63 y.o. F) Treating RN: Rodell Perna Primary Care Tymeir Weathington: PATIENT, NO Other Clinician: Referring Loranda Mastel: Sung Amabile Treating Vernessa Likes/Extender: Linwood Dibbles, HOYT Weeks in Treatment: 0 Primary Learner Assessed: Patient Learning Preferences/Education Level/Primary Language Learning Preference: Explanation Highest Education Level: High School Preferred Language: English Cognitive Barrier Language Barrier: No Translator Needed: No Memory Deficit: No Emotional Barrier: No Cultural/Religious Beliefs Affecting Medical Care: No Physical Barrier Impaired Vision: No Impaired Hearing: No Decreased Hand dexterity: No Knowledge/Comprehension Knowledge Level: High Comprehension Level: High Ability to understand written High instructions: Ability to understand verbal High instructions: Motivation Anxiety Level: Calm Cooperation: Cooperative Education Importance: Acknowledges Need Interest in Health Problems: Asks Questions Perception: Coherent Willingness to Engage in Self- High Management Activities: Readiness to Engage in Self- High Management Activities: Electronic Signature(s) Signed: 05/16/2019 11:38:50 AM By: Rodell Perna Entered By: Rodell Perna on 05/16/2019 08:31:31 Keegan, [name] ([id number]) -------------------------------------------------------------------------------- Fall Risk Assessment Details Patient Name: [name] Date of Service: 05/16/2019 8:00 AM Medical Record Number: [id number] Patient Account Number: [id number] Date of Birth/Sex: 02-15-1956 (63 y.o. F) Treating RN: Rodell Perna Primary Care Ariday Brinker: PATIENT, NO Other Clinician: Referring Zafar Debrosse: Sung Amabile Treating Oluwatimilehin Balfour/Extender: Linwood Dibbles, HOYT Weeks in Treatment: 0 Fall Risk Assessment Items Have you had 2 or more falls in the last 12 monthso 0 No Have you had any fall that resulted in injury in the last 12 monthso 0 No FALLS RISK SCREEN History of falling - immediate or within 3 months 0 No Secondary diagnosis (Do you have 2 or more medical diagnoseso) 0 No Ambulatory aid None/bed rest/wheelchair/nurse 0 No Crutches/cane/walker 0 No Furniture 0 No Intravenous therapy Access/Saline/Heparin Lock 0 No Gait/Transferring Normal/ bed rest/ wheelchair 0 No Weak (short steps with or without shuffle, stooped but able  to lift head while 0 No walking, may seek support from furniture) Impaired (short steps with shuffle, may have difficulty arising from chair, head 0 No down, impaired balance) Mental Status Oriented to own ability 0 No Electronic Signature(s) Signed: 05/16/2019 11:38:50 AM By: Army Melia Entered By: Army Melia on 05/16/2019 08:31:35 Eichel, [name] ([id number]) -------------------------------------------------------------------------------- Nutrition Risk Screening Details Patient Name: [name] Date of Service: 05/16/2019 8:00 AM Medical Record Number: [id number] Patient Account Number: [id number] Date of Birth/Sex: 1955/10/30 (65 y.o. F) Treating RN: Army Melia Primary Care Rasheed Welty: PATIENT, NO Other Clinician: Referring Torris House: Benjamine Sprague Treating Zariyah Stephens/Extender: STONE III, HOYT Weeks in Treatment: 0 Height (in): 64 Weight (lbs): 190 Body Mass Index (BMI): 32.6 Nutrition Risk Screening Items Score Screening NUTRITION RISK SCREEN: I have an illness or condition that made me change the kind and/or amount of 0 No food I eat I eat fewer than two meals per day 0 No I eat few fruits and vegetables, or milk products 0 No I have three or more  drinks of beer, liquor or wine almost every day 0 No I have tooth or mouth problems that make it hard for me to eat 0 No I don't always have enough money to buy the food I need 0 No I eat alone most of the time 0 No I take three or more different prescribed or over-the-counter drugs a day 0 No Without wanting to, I have lost or gained 10 pounds in the last six months 0 No I am not always physically able to shop, cook and/or feed myself 0 No Nutrition Protocols Good Risk Protocol 0 No interventions needed Moderate Risk Protocol High Risk Proctocol Risk Level: Good Risk Score: 0 Electronic Signature(s) Signed: 05/16/2019 11:38:50 AM By: Army Melia Entered By: Army Melia on 05/16/2019 08:31:40

## 2019-05-19 ENCOUNTER — Other Ambulatory Visit: Payer: Self-pay

## 2019-05-19 DIAGNOSIS — L98492 Non-pressure chronic ulcer of skin of other sites with fat layer exposed: Secondary | ICD-10-CM | POA: Diagnosis not present

## 2019-05-19 NOTE — Progress Notes (Signed)
ENEIDA, EVERS ([id number]) Visit Report for 05/19/2019 Arrival Information Details Patient Name: [name], [name] Date of Service: 05/19/2019 8:15 AM Medical Record Number: [id number] Patient Account Number: [id number] Date of Birth/Sex: 1955-05-27 (64 y.o. F) Treating RN: Curtis Sites Primary Care Duey Liller: PATIENT, NO Other Clinician: Referring Aastha Dayley: Sung Amabile Treating Abhijay Morriss/Extender: Linwood Dibbles, HOYT Weeks in Treatment: 0 Visit Information History Since Last Visit Added or deleted any medications: No Patient Arrived: Ambulatory Any new allergies or adverse reactions: No Arrival Time: 08:13 Had a fall or experienced change in No Accompanied By: self activities of daily living that may affect Transfer Assistance: None risk of falls: Patient Identification Verified: Yes Signs or symptoms of abuse/neglect since last visito No Secondary Verification Process Completed: Yes Hospitalized since last visit: No Implantable device outside of the clinic excluding No cellular tissue based products placed in the center since last visit: Has Dressing in Place as Prescribed: Yes Pain Present Now: No Notes 98.5 Electronic Signature(s) Signed: 05/19/2019 4:42:47 PM By: Curtis Sites Entered By: Curtis Sites on 05/19/2019 08:23:07 Pereda, [name] ([id number]) -------------------------------------------------------------------------------- Clinic Level of Care Assessment Details Patient Name: [name] Date of Service: 05/19/2019 8:15 AM Medical Record Number: [id number] Patient Account Number: [id number] Date of Birth/Sex: 06/27/1955 (63 y.o. F) Treating RN: Curtis Sites Primary Care Kadarius Cuffe: PATIENT, NO Other Clinician: Referring Nishanth Mccaughan: Sung Amabile Treating Afnan Emberton/Extender: Linwood Dibbles, HOYT Weeks in Treatment: 0 Clinic Level of Care Assessment Items TOOL 4 Quantity Score []  - Use when only an EandM is performed on FOLLOW-UP visit 0 ASSESSMENTS -  Nursing Assessment / Reassessment X - Reassessment of Co-morbidities (includes updates in patient status) 1 10 X- 1 5 Reassessment of Adherence to Treatment Plan ASSESSMENTS - Wound and Skin Assessment / Reassessment X - Simple Wound Assessment / Reassessment - one wound 1 5 []  - 0 Complex Wound Assessment / Reassessment - multiple wounds []  - 0 Dermatologic / Skin Assessment (not related to wound area) ASSESSMENTS - Focused Assessment []  - Circumferential Edema Measurements - multi extremities 0 []  - 0 Nutritional Assessment / Counseling / Intervention []  - 0 Lower Extremity Assessment (monofilament, tuning fork, pulses) []  - 0 Peripheral Arterial Disease Assessment (using hand held doppler) ASSESSMENTS - Ostomy and/or Continence Assessment and Care []  - Incontinence Assessment and Management 0 []  - 0 Ostomy Care Assessment and Management (repouching, etc.) PROCESS - Coordination of Care X - Simple Patient / Family Education for ongoing care 1 15 []  - 0 Complex (extensive) Patient / Family Education for ongoing care X- 1 10 Staff obtains , Records, Test Results / Process Orders []  - 0 Staff telephones HHA, Nursing Homes / Clarify orders / etc []  - 0 Routine Transfer to another Facility (non-emergent condition) []  - 0 Routine Hospital Admission (non-emergent condition) []  - 0 New Admissions / / Ordering NPWT, Apligraf, etc. []  - 0 Emergency Hospital Admission (emergent condition) X- 1 10 Simple Discharge Coordination Rizo, Rudy ( ) []  - 0 Complex (extensive) Discharge Coordination PROCESS - Special Needs []  - Pediatric / Minor Patient Management 0 []  - 0 Isolation Patient Management []  - 0 Hearing / Language / Visual special needs []  - 0 Assessment of Community assistance (transportation, D/C planning, etc.) []  - 0 Additional assistance / Altered mentation []  - 0 Support Surface(s) Assessment (bed, cushion, seat,  etc.) INTERVENTIONS - Wound Cleansing / Measurement X - Simple Wound Cleansing - one wound 1 5 []  - 0 Complex Wound Cleansing - multiple wounds X- 1 5 Wound Imaging (photographs - any  number of wounds) []  - 0 Wound Tracing (instead of photographs) X- 1 5 Simple Wound Measurement - one wound []  - 0 Complex Wound Measurement - multiple wounds INTERVENTIONS - Wound Dressings X - Small Wound Dressing one or multiple wounds 1 10 []  - 0 Medium Wound Dressing one or multiple wounds []  - 0 Large Wound Dressing one or multiple wounds []  - 0 Application of Medications - topical []  - 0 Application of Medications - injection INTERVENTIONS - Miscellaneous []  - External ear exam 0 []  - 0 Specimen Collection (cultures, biopsies, blood, body fluids, etc.) []  - 0 Specimen(s) / Culture(s) sent or taken to Lab for analysis []  - 0 Patient Transfer (multiple staff / Civil Service fast streamer / Similar devices) []  - 0 Simple Staple / Suture removal (25 or less) []  - 0 Complex Staple / Suture removal (26 or more) []  - 0 Hypo / Hyperglycemic Management (close monitor of Blood Glucose) []  - 0 Ankle / Brachial Index (ABI) - do not check if billed separately []  - 0 Vital Signs Weyrauch, Beckett ([id number]) Has the patient been seen at the hospital within the last three years: Yes Total Score: 80 Level Of Care: New/Established - Level 3 Electronic Signature(s) Signed: 05/19/2019 4:42:47 PM By: Montey Hora Entered By: Montey Hora on 05/19/2019 08:22:48 Mccleod, [name] ([id number]) -------------------------------------------------------------------------------- Encounter Discharge Information Details Patient Name: [name] Date of Service: 05/19/2019 8:15 AM Medical Record Number: [id number] Patient Account Number: [id number] Date of Birth/Sex: [birth year] (63 y.o. F) Treating RN: Montey Hora Primary Care Tramell Piechota: PATIENT, NO Other Clinician: Referring Lestat Golob: Benjamine Sprague Treating Juandiego Kolenovic/Extender: Melburn Hake, HOYT Weeks in Treatment: 0 Encounter Discharge Information Items Discharge Condition: Stable Ambulatory Status: Ambulatory Discharge Destination: Home Transportation: Private Auto Accompanied By: self Schedule Follow-up Appointment: No Clinical Summary of Care: Electronic Signature(s) Signed: 05/19/2019 4:42:47 PM By: Montey Hora Entered By: Montey Hora on 05/19/2019 08:22:30 Whiting, [name] ([id number]) -------------------------------------------------------------------------------- Wound Assessment Details Patient Name: [name] Date of Service: 05/19/2019 8:15 AM Medical Record Number: [id number] Patient Account Number: [id number] Date of Birth/Sex: [birth year] (63 y.o. F) Treating RN: Montey Hora Primary Care Maurice Fotheringham: PATIENT, NO Other Clinician: Referring Justus Duerr: Benjamine Sprague Treating Asyah Candler/Extender: STONE III, HOYT Weeks in Treatment: 0 Wound Status Wound Number: 1 Primary Abscess Etiology: Wound Location: Right Axilla Wound Open Wounding Event: Gradually Appeared Status: Date Acquired: 05/02/2019 Comorbid Cataracts, Chronic Obstructive Pulmonary Weeks Of Treatment: 0 History: Disease (COPD), Hypertension, Myocardial Clustered Wound: No Infarction Photos Wound Measurements Length: (cm) 0.4 Width: (cm) 4 Depth: (cm) 2.8 Area: (cm) 1.257 Volume: (cm) 3.519 % Reduction in Area: 0% % Reduction in Volume: 0% Epithelialization: None Tunneling: Yes Position (o'clock): 2 Maximum Distance: (cm) 6.2 Undermining: Yes Starting Position (o'clock): 1 Ending Position (o'clock): 1 Maximum Distance: (cm) 0.2 Wound Description Full Thickness Without Exposed Support Foul Od Classification: Structures Slough/ Wound Margin: Flat and Intact Exudate Large Amount: Exudate Type: Serosanguineous Exudate Color: red, brown or After Cleansing: No Fibrino Yes Wound Bed Granulation Amount: Large (67-100%)  Exposed Structure Callins, My ([id number]) Granulation Quality: Red Fascia Exposed: No Necrotic Amount: Small (1-33%) Fat Layer (Subcutaneous Tissue) Exposed: Yes Necrotic Quality: Adherent Slough Tendon Exposed: No Muscle Exposed: No Joint Exposed: No Bone Exposed: No Treatment Notes Wound #1 (Right Axilla) Notes silvercel, BFD Electronic Signature(s) Signed: 05/19/2019 4:42:47 PM By: Montey Hora Entered By: Montey Hora on 05/19/2019 08:22:05

## 2019-05-21 ENCOUNTER — Other Ambulatory Visit: Payer: Self-pay

## 2019-05-21 DIAGNOSIS — L98492 Non-pressure chronic ulcer of skin of other sites with fat layer exposed: Secondary | ICD-10-CM | POA: Diagnosis not present

## 2019-05-21 NOTE — Progress Notes (Signed)
[name], [name] ([id number]) Visit Report for 05/21/2019 Arrival Information Details Patient Name: [name], [name] Date of Service: 05/21/2019 8:00 AM Medical Record Number: [id number] Patient Account Number: [id number] Date of Birth/Sex: 1955/12/15 (64 y.o. F) Treating RN: [name] Primary Care [name]: PATIENT, NO Other Clinician: Referring [name]: [name] Treating [name]/Extender: [name], [name] [name]: 0 Visit Information History Since Last Visit Added or deleted any medications: No Patient Arrived: Ambulatory Any new allergies or adverse reactions: No Arrival Time: 08:22 Had a fall or experienced change in No Accompanied By: self activities of daily living that may affect Transfer Assistance: None risk of falls: Patient Identification Verified: Yes Signs or symptoms of abuse/neglect since last visito No Hospitalized since last visit: No Has Dressing in Place as Prescribed: Yes Pain Present Now: No Electronic Signature(s) Signed: 05/21/2019 1:42:42 PM By: [name] Entered By: [name] on 05/21/2019 08:22:40 [name], [name] ([id number]) -------------------------------------------------------------------------------- Clinic Level of Care Assessment Details Patient Name: [name] Date of Service: 05/21/2019 8:00 AM Medical Record Number: [id number] Patient Account Number: [id number] Date of Birth/Sex: January 01, 1956 (64 y.o. F) Treating RN: [name] Primary Care Grecia Lynk: PATIENT, NO Other Clinician: Referring [name]: [name] Treating [name]/Extender: [name], [name] [name]: 0 Clinic Level of Care Assessment Items TOOL 4 Quantity Score []  - Use when only an EandM is performed on FOLLOW-UP visit 0 ASSESSMENTS - Nursing Assessment / Reassessment X - Reassessment of Co-morbidities (includes updates in patient status) 1 10 X- 1 5 Reassessment of Adherence to [name] Plan ASSESSMENTS - Wound and Skin  Assessment / Reassessment X - Simple Wound Assessment / Reassessment - one wound 1 5 []  - 0 Complex Wound Assessment / Reassessment - multiple wounds []  - 0 Dermatologic / Skin Assessment (not related to wound area) ASSESSMENTS - Focused Assessment []  - Circumferential Edema Measurements - multi extremities 0 []  - 0 Nutritional Assessment / Counseling / Intervention []  - 0 Lower Extremity Assessment (monofilament, tuning fork, pulses) []  - 0 Peripheral Arterial Disease Assessment (using hand held doppler) ASSESSMENTS - Ostomy and/or Continence Assessment and Care []  - Incontinence Assessment and Management 0 []  - 0 Ostomy Care Assessment and Management (repouching, etc.) PROCESS - Coordination of Care X - Simple Patient / Family Education for ongoing care 1 15 []  - 0 Complex (extensive) Patient / Family Education for ongoing care X- 1 10 Staff obtains Programmer, systems, Records, Test Results / Process Orders []  - 0 Staff telephones HHA, Nursing Homes / Clarify orders / etc []  - 0 Routine Transfer to another Facility (non-emergent condition) []  - 0 Routine Hospital Admission (non-emergent condition) []  - 0 New Admissions / Biomedical engineer / Ordering NPWT, Apligraf, etc. []  - 0 Emergency Hospital Admission (emergent condition) X- 1 10 Simple Discharge Coordination [name], [name] ([id number]) []  - 0 Complex (extensive) Discharge Coordination PROCESS - Special Needs []  - Pediatric / Minor Patient Management 0 []  - 0 Isolation Patient Management []  - 0 Hearing / Language / Visual special needs []  - 0 Assessment of Community assistance (transportation, D/C planning, etc.) []  - 0 Additional assistance / Altered mentation []  - 0 Support Surface(s) Assessment (bed, cushion, seat, etc.) INTERVENTIONS - Wound Cleansing / Measurement X - Simple Wound Cleansing - one wound 1 5 []  - 0 Complex Wound Cleansing - multiple wounds X- 1 5 Wound Imaging (photographs - any  number of wounds) []  - 0 Wound Tracing (instead of photographs) X- 1 5 Simple Wound Measurement - one wound []  - 0 Complex Wound Measurement -  multiple wounds INTERVENTIONS - Wound Dressings []  - Small Wound Dressing one or multiple wounds 0 X- 1 15 Medium Wound Dressing one or multiple wounds []  - 0 Large Wound Dressing one or multiple wounds []  - 0 Application of Medications - topical []  - 0 Application of Medications - injection INTERVENTIONS - Miscellaneous []  - External ear exam 0 []  - 0 Specimen Collection (cultures, biopsies, blood, body fluids, etc.) []  - 0 Specimen(s) / Culture(s) sent or taken to Lab for analysis []  - 0 Patient Transfer (multiple staff / / Similar devices) []  - 0 Simple Staple / Suture removal (25 or less) []  - 0 Complex Staple / Suture removal (26 or more) []  - 0 Hypo / Hyperglycemic Management (close monitor of Blood Glucose) []  - 0 Ankle / Brachial Index (ABI) - do not check if billed separately []  - 0 Vital Signs [name], [name] ( ) Has the patient been seen at the hospital within the last three years: Yes Total Score: 85 Level Of Care: New/Established - Level 3 Electronic Signature(s) Signed: 05/21/2019 1:42:42 PM By: Entered By: on 05/21/2019 08:28:33 Pangallo, ( ) -------------------------------------------------------------------------------- Encounter Discharge Information Details Patient Name: Nurse, adult Date of Service: 05/21/2019 8:00 AM Medical Record Number: Patient Account Number: Date of Birth/Sex: May 28, 1955 (64 y.o. F) Treating RN: [id number] Primary Care Marquetta Weiskopf: PATIENT, NO Other Clinician: Referring Elleah Hemsley: 05/23/2019 Treating Zilah Villaflor/Extender: Rodell Perna, [name] [name]: 0 Encounter Discharge Information Items Discharge Condition: Stable Ambulatory Status: Ambulatory Discharge Destination:  Home Transportation: Private Auto Accompanied By: self Schedule Follow-up Appointment: Yes Clinical Summary of Care: Electronic Signature(s) Signed: 05/21/2019 1:42:42 PM By: 05/23/2019 Entered By: [name] on 05/21/2019 08:28:06 [name], [name] ([name]) -------------------------------------------------------------------------------- Wound Assessment Details Patient Name: 05/23/2019 Date of Service: 05/21/2019 8:00 AM Medical Record Number: [id number] Patient Account Number: 14/07/1955 Date of Birth/Sex: 05/14/1955 (63 y.o. F) Treating RN: Sung Amabile Primary Care Ginette Bradway: PATIENT, NO Other Clinician: Referring Murvin Gift: Linwood Dibbles Treating Ranie Chinchilla/Extender: STONE III, [name] [name]: 0 Wound Status Wound Number: 1 Primary Etiology: Abscess Wound Location: Right Axilla Wound Status: Open Wounding Event: Gradually Appeared Date Acquired: 05/02/2019 [name] Of [name]: 0 Clustered Wound: No Wound Measurements Length: (cm) 0.2 Width: (cm) 4 Depth: (cm) 2.5 Area: (cm) 0.628 Volume: (cm) 1.571 % Reduction in Area: 50% % Reduction in Volume: 55.4% Wound Description Full Thickness Without Exposed Support Classification: Structures [name] Notes Wound #1 (Right Axilla) Notes silvercel, drawtext, BFD Electronic Signature(s) Signed: 05/21/2019 1:42:42 PM By: Rodell Perna Entered By: 05/23/2019 on 05/21/2019 08:27:33

## 2019-05-23 ENCOUNTER — Other Ambulatory Visit: Payer: Self-pay

## 2019-05-23 ENCOUNTER — Encounter: Payer: Medicare Other | Admitting: Physician Assistant

## 2019-05-23 DIAGNOSIS — L98492 Non-pressure chronic ulcer of skin of other sites with fat layer exposed: Secondary | ICD-10-CM | POA: Diagnosis not present

## 2019-05-23 NOTE — Progress Notes (Signed)
TABER, SANKEY ([id number]) Visit Report for 05/23/2019 Chief Complaint Document Details Patient Name: HARBOUR, CONVEY Date of Service: 05/23/2019 8:00 AM Medical Record Number: [id number] Patient Account Number: [id number] Date of Birth/Sex: Aug 03, 1955 (64 y.o. F) Treating RN: Huel Coventry Primary Care Provider: PATIENT, NO Other Clinician: Referring Provider: Sung Amabile Treating Provider/Extender: Linwood Dibbles, Blaine Guiffre Weeks in Treatment: 1 Information Obtained from: Patient Chief Complaint Right Axilla abscess and ulcer Electronic Signature(s) Signed: 05/23/2019 1:21:59 PM By: Lenda Kelp PA-C Entered By: Lenda Kelp on 05/23/2019 08:04:35 Fennell, [name] ([id number]) -------------------------------------------------------------------------------- HPI Details Patient Name: [name] Date of Service: 05/23/2019 8:00 AM Medical Record Number: [id number] Patient Account Number: [id number] Date of Birth/Sex: 01-Jan-1956 (63 y.o. F) Treating RN: Huel Coventry Primary Care Provider: PATIENT, NO Other Clinician: Referring Provider: Sung Amabile Treating Provider/Extender: Linwood Dibbles, Special Ranes Weeks in Treatment: 1 History of Present Illness HPI Description: 05/16/2019 patient presents today for initial evaluation in our clinic concerning a abscess she had in the right axillary region. This was subsequently evaluated by Dr. Tonna Boehringer in the hospital and he did have to open this up in order to drain the abscess. Subsequent to that it was recommended that the patient have this packed on a regular basis with saline wet-to- dry dressings. Unfortunately the patient does not have anyone who can really help her with this. Only her son is apparently in the picture and she states that he would "pass out" if he were to have to do this. Nonetheless she has been in a very tight spot she actually is gone back to the hospital to have this changed at one point and then subsequently they  recommended she go back to the surgeon Dr. Tonna Boehringer actually referred her to [place] to help manage the patient's wound. With that being said currently she states that she does not have any discomfort except for if the area is being packed with the packing is removed. She does have a history of hypertension but currently states that that is typically under pretty good control. With regard to the wound itself I do believe that this is something that is going to need to be packed on a more regular basis. This may and tell us having to do this some here in the clinic and then potentially getting her home health as well if were able to do so. I do think it is good to be of utmost importance however to keep this packing changed if at all possible daily although weekends are good to be difficult no matter what. No fevers, chills, nausea, vomiting, or diarrhea. 05/23/2019 upon evaluation today patient actually appears to be doing excellent in regard to her wound. She has been tolerating the dressing changes with the silver alginate without complication. Fortunately I am very pleased with the progress she is made this seems to be measuring better and in just 1 week's time is much cleaner she also tells me is not hurting like it was previous which is also excellent news. All in all I do not see anything that really has me concerned at this point which is great news as well. Electronic Signature(s) Signed: 05/23/2019 8:23:55 AM By: Lenda Kelp PA-C Entered By: Lenda Kelp on 05/23/2019 08:23:55 Schorsch, [name] ([id number]) -------------------------------------------------------------------------------- Physical Exam Details Patient Name: Cardarelli, [name] Date of Service: 05/23/2019 8:00 AM Medical Record Number: [id number] Patient Account Number: [id number] Date of Birth/Sex: 1955-06-18 (63 y.o. F) Treating RN: Huel Coventry Primary Care Provider: PATIENT, NO Other Clinician: Referring Provider: Tonna Boehringer,  ISAMI Treating Provider/Extender: STONE III, Erion Weightman Weeks in Treatment: 1 Constitutional Well-nourished and well-hydrated in no acute distress. Respiratory normal breathing without difficulty. Psychiatric this patient is able to make decisions and demonstrates good insight into disease process. Alert and Oriented x 3. pleasant and cooperative. Notes Upon inspection patient's wound bed actually showed signs of good granulation at this point I did use saline, a Q-tip, and some gauze to clean the base of the wound which she tolerated without having any significant pain today which is a good improvement. Subsequently I did go ahead as well and verify that there were no other tunnels or tracking anywhere else that there did not seem to be. Overall very pleased in this regard. Electronic Signature(s) Signed: 05/23/2019 8:24:38 AM By: Lenda Kelp PA-C Entered By: Lenda Kelp on 05/23/2019 08:24:38 Yurick, [name] ([id number]) -------------------------------------------------------------------------------- Physician Orders Details Patient Name: [name] Date of Service: 05/23/2019 8:00 AM Medical Record Number: [id number] Patient Account Number: [id number] Date of Birth/Sex: 1955/07/25 (63 y.o. F) Treating RN: Huel Coventry Primary Care Provider: PATIENT, NO Other Clinician: Referring Provider: Sung Amabile Treating Provider/Extender: Linwood Dibbles, Shyane Fossum Weeks in Treatment: 1 Verbal / Phone Orders: No Diagnosis Coding ICD-10 Coding Code Description L02.411 Cutaneous abscess of right axilla L98.492 Non-pressure chronic ulcer of skin of other sites with fat layer exposed I10 Essential (primary) hypertension Wound Cleansing Wound #1 Right Axilla o Clean wound with Normal Saline. o May shower with protection. - Do not get your wound wet Primary Wound Dressing Wound #1 Right Axilla o Silver Alginate Secondary Dressing Wound #1 Right Axilla o Boardered Foam  Dressing o Drawtex Dressing Change Frequency Wound #1 Right Axilla o Change dressing every day. - Mondays, Wednesdays and Fridays in clinic Follow-up Appointments o Return Appointment in 1 week. o Nurse Visit as needed Home Health Wound #1 Right Axilla o Continue Home Health Visits - [facility] o Home Health Nurse may visit PRN to address patientos wound care needs. o FACE TO FACE ENCOUNTER: MEDICARE and MEDICAID PATIENTS: I certify that this patient is under my care and that I had a face-to-face encounter that meets the physician face-to-face encounter requirements with this patient on this date. The encounter with the patient was in whole or in part for the following MEDICAL CONDITION: (primary reason for Home Healthcare) MEDICAL NECESSITY: I certify, that based on my findings, NURSING services are a medically necessary home health service. HOME BOUND STATUS: I certify that my clinical findings support that this patient is homebound (i.e., Due to illness or injury, pt requires aid of supportive devices such as crutches, cane, wheelchairs, walkers, the use of special transportation or the assistance of another person to leave their place of residence. There is a normal inability to leave the home and doing so requires considerable and taxing effort. Other absences are for medical reasons / religious services and are infrequent or of short duration when for other reasons). Hughett, [name] ([id number]) o If current dressing causes regression in wound condition, may D/C ordered dressing product/s and apply Normal Saline Moist Dressing daily until next Wound Healing Center / Other MD appointment. Notify Wound Healing Center of regression in wound condition at [phone]. o Please direct any NON-WOUND related issues/requests for orders to patient's Primary Care Physician Electronic Signature(s) Signed: 05/23/2019 1:21:59 PM By: Lenda Kelp PA-C Signed: 05/23/2019  5:05:43 PM By: Elliot Gurney, BSN, RN, CWS, Kim RN, BSN Entered By: Elliot Gurney, BSN, RN, CWS, Kim on 05/23/2019 08:19:05 Molnar, [name] ([id number]) -------------------------------------------------------------------------------- Problem List Details  Patient Name: SHAILYN, WEYANDT Date of Service: 05/23/2019 8:00 AM Medical Record Number: [id number] Patient Account Number: [id number] Date of Birth/Sex: 1955/05/10 (64 y.o. F) Treating RN: Huel Coventry Primary Care Provider: PATIENT, NO Other Clinician: Referring Provider: Sung Amabile Treating Provider/Extender: Linwood Dibbles, Rayelynn Loyal Weeks in Treatment: 1 Active Problems ICD-10 Evaluated Encounter Code Description Active Date Today Diagnosis L02.411 Cutaneous abscess of right axilla 05/16/2019 No Yes L98.492 Non-pressure chronic ulcer of skin of other sites with fat layer 05/16/2019 No Yes exposed I10 Essential (primary) hypertension 05/16/2019 No Yes Inactive Problems Resolved Problems Electronic Signature(s) Signed: 05/23/2019 1:21:59 PM By: Lenda Kelp PA-C Entered By: Lenda Kelp on 05/23/2019 08:04:24 Ishman, Crestina ([id number]) -------------------------------------------------------------------------------- Progress Note Details Patient Name: [name] Date of Service: 05/23/2019 8:00 AM Medical Record Number: [id number] Patient Account Number: [id number] Date of Birth/Sex: [birth year] (63 y.o. F) Treating RN: Huel Coventry Primary Care Provider: PATIENT, NO Other Clinician: Referring Provider: Sung Amabile Treating Provider/Extender: Linwood Dibbles, Sibbie Flammia Weeks in Treatment: 1 Subjective Chief Complaint Information obtained from Patient Right Axilla abscess and ulcer History of Present Illness (HPI) 05/16/2019 patient presents today for initial evaluation in our clinic concerning a abscess she had in the right axillary region. This was subsequently evaluated by Dr. Tonna Boehringer in the hospital and he did have to open this up in order  to drain the abscess. Subsequent to that it was recommended that the patient have this packed on a regular basis with saline wet-to-dry dressings. Unfortunately the patient does not have anyone who can really help her with this. Only her son is apparently in the picture and she states that he would "pass out" if he were to have to do this. Nonetheless she has been in a very tight spot she actually is gone back to the hospital to have this changed at one point and then subsequently they recommended she go back to the surgeon Dr. Tonna Boehringer actually referred her to [place] to help manage the patient's wound. With that being said currently she states that she does not have any discomfort except for if the area is being packed with the packing is removed. She does have a history of hypertension but currently states that that is typically under pretty good control. With regard to the wound itself I do believe that this is something that is going to need to be packed on a more regular basis. This may and tell us having to do this some here in the clinic and then potentially getting her home health as well if were able to do so. I do think it is good to be of utmost importance however to keep this packing changed if at all possible daily although weekends are good to be difficult no matter what. No fevers, chills, nausea, vomiting, or diarrhea. 05/23/2019 upon evaluation today patient actually appears to be doing excellent in regard to her wound. She has been tolerating the dressing changes with the silver alginate without complication. Fortunately I am very pleased with the progress she is made this seems to be measuring better and in just 1 week's time is much cleaner she also tells me is not hurting like it was previous which is also excellent news. All in all I do not see anything that really has me concerned at this point which is great news as well. Objective Constitutional Well-nourished and well-hydrated in  no acute distress. Vitals Time Taken: 8:05 AM, Height: 64 in, Weight: 190 lbs, BMI: 32.6, Temperature: 97.9 F, Pulse: 64 bpm,  Respiratory Rate: 16 breaths/min, Blood Pressure: 145/90 mmHg. Respiratory normal breathing without difficulty. Psychiatric Padmanabhan, COPELYN ([id number]) this patient is able to make decisions and demonstrates good insight into disease process. Alert and Oriented x 3. pleasant and cooperative. General Notes: Upon inspection patient's wound bed actually showed signs of good granulation at this point I did use saline, a Q-tip, and some gauze to clean the base of the wound which she tolerated without having any significant pain today which is a good improvement. Subsequently I did go ahead as well and verify that there were no other tunnels or tracking anywhere else that there did not seem to be. Overall very pleased in this regard. Integumentary (Hair, Skin) Wound #1 status is Open. Original cause of wound was Gradually Appeared. The wound is located on the Right Axilla. The wound measures 0.1cm length x 4cm width x 2.5cm depth; 0.314cm^2 area and 0.785cm^3 volume. There is Fat Layer (Subcutaneous Tissue) Exposed exposed. There is no tunneling or undermining noted. There is a medium amount of serosanguineous drainage noted. There is large (67-100%) red granulation within the wound bed. There is a small (1-33%) amount of necrotic tissue within the wound bed including Adherent Slough. Assessment Active Problems ICD-10 Cutaneous abscess of right axilla Non-pressure chronic ulcer of skin of other sites with fat layer exposed Essential (primary) hypertension Plan Wound Cleansing: Wound #1 Right Axilla: Clean wound with Normal Saline. May shower with protection. - Do not get your wound wet Primary Wound Dressing: Wound #1 Right Axilla: Silver Alginate Secondary Dressing: Wound #1 Right Axilla: Boardered Foam Dressing Drawtex Dressing Change Frequency: Wound  #1 Right Axilla: Change dressing every day. - Mondays, Wednesdays and Fridays in clinic Follow-up Appointments: Return Appointment in 1 week. Nurse Visit as needed Home Health: Wound #1 Right Axilla: Continue Home Health Visits - [place] Nurse may visit PRN to address patient s wound care needs. FACE TO FACE ENCOUNTER: MEDICARE and MEDICAID PATIENTS: I certify that this patient is under my care and that I had a face-to-face encounter that meets the physician face-to-face encounter requirements with this patient on this date. The [place], [place] ([id number]) encounter with the patient was in whole or in part for the following MEDICAL CONDITION: (primary reason for [place]) MEDICAL NECESSITY: I certify, that based on my findings, NURSING services are a medically necessary home health service. HOME BOUND STATUS: I certify that my clinical findings support that this patient is homebound (i.e., Due to illness or injury, pt requires aid of supportive devices such as crutches, cane, wheelchairs, walkers, the use of special transportation or the assistance of another person to leave their place of residence. There is a normal inability to leave the home and doing so requires considerable and taxing effort. Other absences are for medical reasons / religious services and are infrequent or of short duration when for other reasons). If current dressing causes regression in wound condition, may D/C ordered dressing product/s and apply Normal Saline Moist Dressing daily until next [facility] / Other MD appointment. [place] of regression in wound condition at [phone]. Please direct any NON-WOUND related issues/requests for orders to patient's Primary Care Physician 1. My suggestion at this time is can be that we go ahead and continue with the current wound care measures which includes the packing with silver alginate. This is a fairly good sized  hole and is good to require packing likely for some time. 2. I am also can recommend that we  continue with the bordered foam dressing to cover were also using drawtex over top to allow the something more to drain into. We will see patient back for reevaluation in 1 week here in the clinic. If anything worsens or changes patient will contact our office for additional recommendations. Electronic Signature(s) Signed: 05/23/2019 8:25:09 AM By: Lenda Kelp PA-C Entered By: Lenda Kelp on 05/23/2019 08:25:08 Sciulli, [name] ([id number]) -------------------------------------------------------------------------------- SuperBill Details Patient Name: [name] Date of Service: 05/23/2019 Medical Record Number: [id number] Patient Account Number: [id number] Date of Birth/Sex: 1956/04/29 (63 y.o. F) Treating RN: Huel Coventry Primary Care Provider: PATIENT, NO Other Clinician: Referring Provider: Sung Amabile Treating Provider/Extender: Linwood Dibbles, Allea Kassner Weeks in Treatment: 1 Diagnosis Coding ICD-10 Codes Code Description L02.411 Cutaneous abscess of right axilla L98.492 Non-pressure chronic ulcer of skin of other sites with fat layer exposed I10 Essential (primary) hypertension Facility Procedures CPT4 Code: [id number] Description: 99213 - WOUND CARE VISIT-LEV 3 EST PT Modifier: Quantity: 1 Physician Procedures CPT4 Code: [id number] Description: 99214 - WC PHYS LEVEL 4 - EST PT ICD-10 Diagnosis Description L02.411 Cutaneous abscess of right axilla L98.492 Non-pressure chronic ulcer of skin of other sites with fat l I10 Essential (primary) hypertension Modifier: ayer exposed Quantity: 1 Electronic Signature(s) Signed: 05/23/2019 8:25:17 AM By: Lenda Kelp PA-C Entered By: Lenda Kelp on 05/23/2019 08:25:17

## 2019-05-26 ENCOUNTER — Other Ambulatory Visit: Payer: Self-pay

## 2019-05-26 DIAGNOSIS — L98492 Non-pressure chronic ulcer of skin of other sites with fat layer exposed: Secondary | ICD-10-CM | POA: Diagnosis not present

## 2019-05-26 NOTE — Progress Notes (Signed)
YEILYN, GENT ([id number]) Visit Report for 05/23/2019 Arrival Information Details Patient Name: [name], [name] Date of Service: 05/23/2019 8:00 AM Medical Record Number: [id number] Patient Account Number: [id number] Date of Birth/Sex: 17-Sep-1955 (64 y.o. F) Treating RN: Cornell Barman Primary Care Minta Fair: PATIENT, NO Other Clinician: Referring Lynnley Doddridge: Benjamine Sprague Treating Cadden Elizondo/Extender: Melburn Hake, HOYT Weeks in Treatment: 1 Visit Information History Since Last Visit Added or deleted any medications: No Patient Arrived: Ambulatory Any new allergies or adverse reactions: No Arrival Time: 08:06 Had a fall or experienced change in No Accompanied By: self activities of daily living that may affect Transfer Assistance: None risk of falls: Patient Identification Verified: Yes Signs or symptoms of abuse/neglect since last visito No Secondary Verification Process Completed: Yes Hospitalized since last visit: No Implantable device outside of the clinic excluding No cellular tissue based products placed in the center since last visit: Has Dressing in Place as Prescribed: Yes Pain Present Now: No Electronic Signature(s) Signed: 05/23/2019 4:22:15 PM By: Lorine Bears RCP, RRT, CHT Entered By: Lorine Bears on 05/23/2019 08:06:44 Bergren, [name] ([id number]) -------------------------------------------------------------------------------- Clinic Level of Care Assessment Details Patient Name: [name] Date of Service: 05/23/2019 8:00 AM Medical Record Number: [id number] Patient Account Number: [id number] Date of Birth/Sex: Jul 24, 1955 (64 y.o. F) Treating RN: Cornell Barman Primary Care Idella Lamontagne: PATIENT, NO Other Clinician: Referring Devlin Brink: Benjamine Sprague Treating Mahika Vanvoorhis/Extender: Melburn Hake, HOYT Weeks in Treatment: 1 Clinic Level of Care Assessment Items TOOL 4 Quantity Score '[]'  - Use when only an EandM is performed on FOLLOW-UP  visit 0 ASSESSMENTS - Nursing Assessment / Reassessment X - Reassessment of Co-morbidities (includes updates in patient status) 1 10 X- 1 5 Reassessment of Adherence to Treatment Plan ASSESSMENTS - Wound and Skin Assessment / Reassessment '[]'  - Simple Wound Assessment / Reassessment - one wound 0 '[]'  - 0 Complex Wound Assessment / Reassessment - multiple wounds '[]'  - 0 Dermatologic / Skin Assessment (not related to wound area) ASSESSMENTS - Focused Assessment '[]'  - Circumferential Edema Measurements - multi extremities 0 '[]'  - 0 Nutritional Assessment / Counseling / Intervention '[]'  - 0 Lower Extremity Assessment (monofilament, tuning fork, pulses) '[]'  - 0 Peripheral Arterial Disease Assessment (using hand held doppler) ASSESSMENTS - Ostomy and/or Continence Assessment and Care '[]'  - Incontinence Assessment and Management 0 '[]'  - 0 Ostomy Care Assessment and Management (repouching, etc.) PROCESS - Coordination of Care X - Simple Patient / Family Education for ongoing care 1 15 '[]'  - 0 Complex (extensive) Patient / Family Education for ongoing care X- 1 10 Staff obtains Programmer, systems, Records, Test Results / Process Orders '[]'  - 0 Staff telephones HHA, Nursing Homes / Clarify orders / etc '[]'  - 0 Routine Transfer to another Facility (non-emergent condition) '[]'  - 0 Routine Hospital Admission (non-emergent condition) '[]'  - 0 New Admissions / Biomedical engineer / Ordering NPWT, Apligraf, etc. '[]'  - 0 Emergency Hospital Admission (emergent condition) X- 1 10 Simple Discharge Coordination Dorgan, Jenisis ([id number]) '[]'  - 0 Complex (extensive) Discharge Coordination PROCESS - Special Needs '[]'  - Pediatric / Minor Patient Management 0 '[]'  - 0 Isolation Patient Management '[]'  - 0 Hearing / Language / Visual special needs '[]'  - 0 Assessment of Community assistance (transportation, D/C planning, etc.) '[]'  - 0 Additional assistance / Altered mentation '[]'  - 0 Support Surface(s) Assessment  (bed, cushion, seat, etc.) INTERVENTIONS - Wound Cleansing / Measurement X - Simple Wound Cleansing - one wound 1 5 '[]'  - 0 Complex Wound Cleansing - multiple wounds X- 1 5 Wound Imaging (photographs -  any number of wounds) '[]'  - 0 Wound Tracing (instead of photographs) X- 1 5 Simple Wound Measurement - one wound '[]'  - 0 Complex Wound Measurement - multiple wounds INTERVENTIONS - Wound Dressings '[]'  - Small Wound Dressing one or multiple wounds 0 X- 1 15 Medium Wound Dressing one or multiple wounds '[]'  - 0 Large Wound Dressing one or multiple wounds '[]'  - 0 Application of Medications - topical '[]'  - 0 Application of Medications - injection INTERVENTIONS - Miscellaneous '[]'  - External ear exam 0 '[]'  - 0 Specimen Collection (cultures, biopsies, blood, body fluids, etc.) '[]'  - 0 Specimen(s) / Culture(s) sent or taken to Lab for analysis '[]'  - 0 Patient Transfer (multiple staff / Civil Service fast streamer / Similar devices) '[]'  - 0 Simple Staple / Suture removal (25 or less) '[]'  - 0 Complex Staple / Suture removal (26 or more) '[]'  - 0 Hypo / Hyperglycemic Management (close monitor of Blood Glucose) '[]'  - 0 Ankle / Brachial Index (ABI) - do not check if billed separately X- 1 5 Vital Signs Kervin, Nixon ([id number]) Has the patient been seen at the hospital within the last three years: Yes Total Score: 85 Level Of Care: New/Established - Level 3 Electronic Signature(s) Signed: 05/23/2019 5:05:43 PM By: Gretta Cool, BSN, RN, CWS, Kim RN, BSN Entered By: Gretta Cool, BSN, RN, CWS, Kim on 05/23/2019 08:20:18 Valvano, [name] ([id number]) -------------------------------------------------------------------------------- Encounter Discharge Information Details Patient Name: [name] Date of Service: 05/23/2019 8:00 AM Medical Record Number: [id number] Patient Account Number: [id number] Date of Birth/Sex: 27-Mar-1956 (63 y.o. F) Treating RN: Cornell Barman Primary Care Ayushi Pla: PATIENT, NO Other  Clinician: Referring Derryl Uher: Benjamine Sprague Treating Tommie Bohlken/Extender: Melburn Hake, HOYT Weeks in Treatment: 1 Encounter Discharge Information Items Discharge Condition: Stable Ambulatory Status: Ambulatory Discharge Destination: Home Transportation: Private Auto Accompanied By: self Schedule Follow-up Appointment: Yes Clinical Summary of Care: Electronic Signature(s) Signed: 05/23/2019 5:05:43 PM By: Gretta Cool, BSN, RN, CWS, Kim RN, BSN Entered By: Gretta Cool, BSN, RN, CWS, Kim on 05/23/2019 08:20:56 Orsino, [name] ([id number]) -------------------------------------------------------------------------------- Lower Extremity Assessment Details Patient Name: [name] Date of Service: 05/23/2019 8:00 AM Medical Record Number: [id number] Patient Account Number: [id number] Date of Birth/Sex: 1955/11/20 (63 y.o. F) Treating RN: Army Melia Primary Care Guido Comp: PATIENT, NO Other Clinician: Referring Innocence Schlotzhauer: Benjamine Sprague Treating Regenia Erck/Extender: Melburn Hake, HOYT Weeks in Treatment: 1 Electronic Signature(s) Signed: 05/26/2019 12:59:59 PM By: Army Melia Entered By: Army Melia on 05/23/2019 08:13:16 [place], [name] ([id number]) -------------------------------------------------------------------------------- Multi Wound Chart Details Patient Name: [name] Date of Service: 05/23/2019 8:00 AM Medical Record Number: [id number] Patient Account Number: [id number] Date of Birth/Sex: [birth year] (63 y.o. F) Treating RN: Cornell Barman Primary Care [employer]: PATIENT, NO Other Clinician: Referring Luisdaniel Kenton: Benjamine Sprague Treating Zsofia Prout/Extender: STONE III, HOYT Weeks in Treatment: 1 Vital Signs Height(in): 64 Pulse(bpm): 64 Weight(lbs): 190 Blood Pressure(mmHg): 145/90 Body Mass Index(BMI): 33 Temperature(F): 97.9 Respiratory Rate 16 (breaths/min): Photos: [N/A:N/A] Wound Location: Right Axilla N/A N/A Wounding Event: Gradually Appeared N/A N/A Primary  Etiology: Abscess N/A N/A Comorbid History: Cataracts, Chronic Obstructive N/A N/A Pulmonary Disease (COPD), Hypertension, Myocardial Infarction Date Acquired: 05/02/2019 N/A N/A Weeks of Treatment: 1 N/A N/A Wound Status: Open N/A N/A Measurements L x W x D 0.1x4x2.5 N/A N/A (cm) Area (cm) : 0.314 N/A N/A Volume (cm) : 0.785 N/A N/A % Reduction in Area: 75.00% N/A N/A % Reduction in Volume: 77.70% N/A N/A Classification: Full Thickness Without N/A N/A Exposed Support Structures Exudate Amount: Medium N/A N/A Exudate Type: Serosanguineous N/A N/A Exudate Color: red, brown N/A  N/A Granulation Amount: Large (67-100%) N/A N/A Granulation Quality: Red N/A N/A Necrotic Amount: Small (1-33%) N/A N/A Exposed Structures: Fat Layer (Subcutaneous N/A N/A Tissue) Exposed: Yes Fascia: No Tendon: No Muscle: No Creamer, Aleana ([id number]) Joint: No Bone: No Epithelialization: None N/A N/A Treatment Notes Electronic Signature(s) Signed: 05/23/2019 5:05:43 PM By: Gretta Cool, BSN, RN, CWS, Kim RN, BSN Entered By: Gretta Cool, BSN, RN, CWS, Kim on 05/23/2019 08:17:54 Shinault, [name] ([id number]) -------------------------------------------------------------------------------- Multi-Disciplinary Care Plan Details Patient Name: [name] Date of Service: 05/23/2019 8:00 AM Medical Record Number: [id number] Patient Account Number: [id number] Date of Birth/Sex: 12-07-1955 (63 y.o. F) Treating RN: Cornell Barman Primary Care Joshawa Dubin: PATIENT, NO Other Clinician: Referring Ema Hebner: Benjamine Sprague Treating Abe Schools/Extender: Melburn Hake, HOYT Weeks in Treatment: 1 Active Inactive Abuse / Safety / Falls / Self Care Management Nursing Diagnoses: Potential for falls Goals: Patient will remain injury free related to falls Date Initiated: 05/16/2019 Target Resolution Date: 08/09/2019 Goal Status: Active Interventions: Assess fall risk on admission and as needed Notes: Orientation to the Wound  Care Program Nursing Diagnoses: Knowledge deficit related to the wound healing center program Goals: Patient/caregiver will verbalize understanding of the [facility] Date Initiated: 05/16/2019 Target Resolution Date: 08/09/2019 Goal Status: Active Interventions: Provide education on orientation to the wound center Notes: Pain, Acute or Chronic Nursing Diagnoses: Pain, acute or chronic: actual or potential Goals: Patient/caregiver will verbalize comfort level met Date Initiated: 05/16/2019 Target Resolution Date: 08/09/2019 Goal Status: Active Interventions: Complete pain assessment as per visit requirements Krummel, Chakira ([id number]) Notes: Soft Tissue Infection Nursing Diagnoses: Impaired tissue integrity Goals: Patient will remain free of wound infection Date Initiated: 05/16/2019 Target Resolution Date: 08/09/2019 Goal Status: Active Interventions: Assess signs and symptoms of infection every visit Notes: Wound/Skin Impairment Nursing Diagnoses: Impaired tissue integrity Goals: Ulcer/skin breakdown will heal within 14 weeks Date Initiated: 05/16/2019 Target Resolution Date: 08/09/2019 Goal Status: Active Interventions: Assess patient/caregiver ability to obtain necessary supplies Assess patient/caregiver ability to perform ulcer/skin care regimen upon admission and as needed Assess ulceration(s) every visit Notes: Electronic Signature(s) Signed: 05/23/2019 5:05:43 PM By: Gretta Cool, BSN, RN, CWS, Kim RN, BSN Entered By: Gretta Cool, BSN, RN, CWS, Kim on 05/23/2019 08:17:43 Jansma, [name] ([id number]) -------------------------------------------------------------------------------- Pain Assessment Details Patient Name: [name] Date of Service: 05/23/2019 8:00 AM Medical Record Number: [id number] Patient Account Number: [id number] Date of Birth/Sex: 1956-03-26 (63 y.o. F) Treating RN: Cornell Barman Primary Care Jaena Brocato: PATIENT, NO Other  Clinician: Referring Estiven Kohan: Benjamine Sprague Treating Arvie Villarruel/Extender: Melburn Hake, HOYT Weeks in Treatment: 1 Active Problems Location of Pain Severity and Description of Pain Patient Has Paino No Site Locations Pain Management and Medication Current Pain Management: Electronic Signature(s) Signed: 05/23/2019 4:22:15 PM By: Lorine Bears RCP, RRT, CHT Signed: 05/23/2019 5:05:43 PM By: Gretta Cool, BSN, RN, CWS, Kim RN, BSN Entered By: Lorine Bears on 05/23/2019 08:06:54 [place], [name] ([id number]) -------------------------------------------------------------------------------- Patient/Caregiver Education Details Patient Name: [name] Date of Service: 05/23/2019 8:00 AM Medical Record Number: [id number] Patient Account Number: [id number] Date of Birth/Gender: 03-07-1956 (63 y.o. F) Treating RN: Cornell Barman Primary Care Physician: PATIENT, NO Other Clinician: Referring Physician: Benjamine Sprague Treating Physician/Extender: Sharalyn Ink in Treatment: 1 Education Assessment Education Provided To: Patient Education Topics Provided Wound/Skin Impairment: Handouts: Caring for Your Ulcer Methods: Demonstration, Explain/Verbal Responses: State content correctly Electronic Signature(s) Signed: 05/23/2019 5:05:43 PM By: Gretta Cool, BSN, RN, CWS, Kim RN, BSN Entered By: Gretta Cool, BSN, RN, CWS, Kim on 05/23/2019 08:20:31 [place], [name] ([id number]) -------------------------------------------------------------------------------- Wound Assessment Details Patient Name:  Blasdell, Anneta Date of Service: 05/23/2019 8:00 AM Medical Record Number: [id number] Patient Account Number: [id number] Date of Birth/Sex: 1955-08-17 (64 y.o. F) Treating RN: Army Melia Primary Care Mckenzi Buonomo: PATIENT, NO Other Clinician: Referring Sarinah Doetsch: Benjamine Sprague Treating Rathana Viveros/Extender: STONE III, HOYT Weeks in Treatment: 1 Wound Status Wound Number: 1 Primary  Abscess Etiology: Wound Location: Right Axilla Wound Open Wounding Event: Gradually Appeared Status: Date Acquired: 05/02/2019 Comorbid Cataracts, Chronic Obstructive Pulmonary Weeks Of Treatment: 1 History: Disease (COPD), Hypertension, Myocardial Clustered Wound: No Infarction Photos Wound Measurements Length: (cm) 0.1 Width: (cm) 4 Depth: (cm) 2.5 Area: (cm) 0.314 Volume: (cm) 0.785 % Reduction in Area: 75% % Reduction in Volume: 77.7% Epithelialization: None Tunneling: No Undermining: No Wound Description Full Thickness Without Exposed Support Foul Classification: Structures Sloug Exudate Medium Amount: Exudate Type: Serosanguineous Exudate Color: red, brown Odor After Cleansing: No h/Fibrino Yes Wound Bed Granulation Amount: Large (67-100%) Exposed Structure Granulation Quality: Red Fascia Exposed: No Necrotic Amount: Small (1-33%) Fat Layer (Subcutaneous Tissue) Exposed: Yes Necrotic Quality: Adherent Slough Tendon Exposed: No Muscle Exposed: No Joint Exposed: No Bone Exposed: No Victorio, Natiya ([id number]) Electronic Signature(s) Signed: 05/26/2019 12:59:59 PM By: Army Melia Entered By: Army Melia on 05/23/2019 08:12:22 Lakey, [name] ([id number]) -------------------------------------------------------------------------------- Vitals Details Patient Name: [name] Date of Service: 05/23/2019 8:00 AM Medical Record Number: [id number] Patient Account Number: [id number] Date of Birth/Sex: May 01, 1956 (64 y.o. F) Treating RN: Cornell Barman Primary Care Aamna Mallozzi: PATIENT, NO Other Clinician: Referring Delorean Knutzen: Benjamine Sprague Treating Marella Vanderpol/Extender: Melburn Hake, HOYT Weeks in Treatment: 1 Vital Signs Time Taken: 08:05 Temperature (F): 97.9 Height (in): 64 Pulse (bpm): 64 Weight (lbs): 190 Respiratory Rate (breaths/min): 16 Body Mass Index (BMI): 32.6 Blood Pressure (mmHg): 145/90 Reference Range: 80 - 120 mg / dl Electronic  Signature(s) Signed: 05/23/2019 4:22:15 PM By: Lorine Bears RCP, RRT, CHT Entered By: Becky Sax, Amado Nash on 05/23/2019 08:08:19

## 2019-05-26 NOTE — Progress Notes (Signed)
[name] ([id number]) Visit Report for 05/26/2019 Arrival Information Details Patient Name: [name] Date of Service: 05/26/2019 8:00 AM Medical Record Number: [id number] Patient Account Number: [id number] Date of Birth/Sex: Feb 05, 1956 (64 y.o. F) Treating RN: [name] Primary Care [name]: PATIENT, NO Other Clinician: Referring [name]: [name] Treating [name]/Extender: [name], [name] in Treatment: 1 Visit Information History Since Last Visit Added or deleted any medications: No Patient Arrived: Ambulatory Any new allergies or adverse reactions: No Arrival Time: 08:10 Had a fall or experienced change in No Accompanied By: self activities of daily living that may affect Transfer Assistance: None risk of falls: Patient Identification Verified: Yes Signs or symptoms of abuse/neglect since last visito No Hospitalized since last visit: No Has Dressing in Place as Prescribed: Yes Pain Present Now: No Electronic Signature(s) Signed: 05/26/2019 12:59:59 PM By: [name] Entered By: [name] on 05/26/2019 08:11:12 [name], [name] ([id number]) -------------------------------------------------------------------------------- Clinic Level of Care Assessment Details Patient Name: [name] Date of Service: 05/26/2019 8:00 AM Medical Record Number: [id number] Patient Account Number: [id number] Date of Birth/Sex: 20-Sep-1955 (64 y.o. F) Treating RN: [name] Primary Care [name]: PATIENT, NO Other Clinician: Referring [name]: [name] Treating [name]/Extender: [name], [name] in Treatment: 1 Clinic Level of Care Assessment Items TOOL 4 Quantity Score []  - Use when only an EandM is performed on FOLLOW-UP visit 0 ASSESSMENTS - Nursing Assessment / Reassessment X - Reassessment of Co-morbidities (includes updates in patient status) 1 10 X- 1 5 Reassessment of Adherence to Treatment Plan ASSESSMENTS - Wound and Skin  Assessment / Reassessment X - Simple Wound Assessment / Reassessment - one wound 1 5 []  - 0 Complex Wound Assessment / Reassessment - multiple wounds []  - 0 Dermatologic / Skin Assessment (not related to wound area) ASSESSMENTS - Focused Assessment []  - Circumferential Edema Measurements - multi extremities 0 []  - 0 Nutritional Assessment / Counseling / Intervention []  - 0 Lower Extremity Assessment (monofilament, tuning fork, pulses) []  - 0 Peripheral Arterial Disease Assessment (using hand held doppler) ASSESSMENTS - Ostomy and/or Continence Assessment and Care []  - Incontinence Assessment and Management 0 []  - 0 Ostomy Care Assessment and Management (repouching, etc.) PROCESS - Coordination of Care X - Simple Patient / Family Education for ongoing care 1 15 []  - 0 Complex (extensive) Patient / Family Education for ongoing care X- 1 10 Staff obtains Programmer, systems, Records, Test Results / Process Orders []  - 0 Staff telephones HHA, Nursing Homes / Clarify orders / etc []  - 0 Routine Transfer to another Facility (non-emergent condition) []  - 0 Routine Hospital Admission (non-emergent condition) []  - 0 New Admissions / Biomedical engineer / Ordering NPWT, Apligraf, etc. []  - 0 Emergency Hospital Admission (emergent condition) X- 1 10 Simple Discharge Coordination [name], [name] ([id number]) []  - 0 Complex (extensive) Discharge Coordination PROCESS - Special Needs []  - Pediatric / Minor Patient Management 0 []  - 0 Isolation Patient Management []  - 0 Hearing / Language / Visual special needs []  - 0 Assessment of Community assistance (transportation, D/C planning, etc.) []  - 0 Additional assistance / Altered mentation []  - 0 Support Surface(s) Assessment (bed, cushion, seat, etc.) INTERVENTIONS - Wound Cleansing / Measurement X - Simple Wound Cleansing - one wound 1 5 []  - 0 Complex Wound Cleansing - multiple wounds X- 1 5 Wound Imaging (photographs - any  number of wounds) []  - 0 Wound Tracing (instead of photographs) X- 1 5 Simple Wound Measurement - one wound []  - 0 Complex Wound Measurement -  multiple wounds INTERVENTIONS - Wound Dressings []  - Small Wound Dressing one or multiple wounds 0 X- 1 15 Medium Wound Dressing one or multiple wounds []  - 0 Large Wound Dressing one or multiple wounds []  - 0 Application of Medications - topical []  - 0 Application of Medications - injection INTERVENTIONS - Miscellaneous []  - External ear exam 0 []  - 0 Specimen Collection (cultures, biopsies, blood, body fluids, etc.) []  - 0 Specimen(s) / Culture(s) sent or taken to Lab for analysis []  - 0 Patient Transfer (multiple staff / / Similar devices) []  - 0 Simple Staple / Suture removal (25 or less) []  - 0 Complex Staple / Suture removal (26 or more) []  - 0 Hypo / Hyperglycemic Management (close monitor of Blood Glucose) []  - 0 Ankle / Brachial Index (ABI) - do not check if billed separately []  - 0 Vital Signs [name], [name] ( ) Has the patient been seen at the hospital within the last three years: Yes Total Score: 85 Level Of Care: New/Established - Level 3 Electronic Signature(s) Signed: 05/26/2019 12:59:59 PM By: Entered By: on 05/26/2019 08:16:19 [name], ( ) -------------------------------------------------------------------------------- Encounter Discharge Information Details Patient Name: Nurse, adult Date of Service: 05/26/2019 8:00 AM Medical Record Number: Patient Account Number: Date of Birth/Sex: Sep 22, 1955 (63 y.o. F) Treating RN: [id number] Primary Care Dozier Berkovich: PATIENT, NO Other Clinician: Referring Meris Reede: 05/28/2019 Treating Jionni Helming/Extender: Rodell Perna, [name] in Treatment: 1 Encounter Discharge Information Items Discharge Condition: Stable Ambulatory Status: Ambulatory Discharge Destination:  Home Transportation: Private Auto Accompanied By: self Schedule Follow-up Appointment: Yes Clinical Summary of Care: Electronic Signature(s) Signed: 05/26/2019 12:59:59 PM By: 05/28/2019 Entered By: [name] on 05/26/2019 08:15:46 Paske, [name] (05/28/2019) -------------------------------------------------------------------------------- Wound Assessment Details Patient Name: [id number] Date of Service: 05/26/2019 8:00 AM Medical Record Number: 14/07/[id number] Patient Account Number: [id number] Date of Birth/Sex: 1955/11/12 (63 y.o. F) Treating RN: Linwood Dibbles Primary Care Pauletta Pickney: PATIENT, NO Other Clinician: Referring Mavryk Pino: 05/28/2019 Treating Nicholle Falzon/Extender: STONE III, [name] in Treatment: 1 Wound Status Wound Number: 1 Primary Abscess Etiology: Wound Location: Right Axilla Wound Open Wounding Event: Gradually Appeared Status: Date Acquired: 05/02/2019 Comorbid Cataracts, Chronic Obstructive Pulmonary [name] Of Treatment: 1 History: Disease (COPD), Hypertension, Myocardial Clustered Wound: No Infarction Photos Wound Measurements Length: (cm) 0.1 Width: (cm) 3 Depth: (cm) 2 Area: (cm) 0.236 Volume: (cm) 0.471 % Reduction in Area: 81.2% % Reduction in Volume: 86.6% Epithelialization: None Tunneling: No Undermining: No Wound Description Full Thickness Without Exposed Support Foul Classification: Structures Sloug Exudate Medium Amount: Exudate Type: Serosanguineous Exudate Color: red, brown Odor After Cleansing: No h/Fibrino Yes Wound Bed Granulation Amount: Large (67-100%) Exposed Structure Granulation Quality: Red Fascia Exposed: No Necrotic Amount: Small (1-33%) Fat Layer (Subcutaneous Tissue) Exposed: Yes Necrotic Quality: Adherent Slough Tendon Exposed: No Muscle Exposed: No Joint Exposed: No Bone Exposed: No Kaluzny, Mirka (Rodell Perna) Treatment Notes Wound #1 (Right Axilla) Notes silvercel, drawtext,  BFD Electronic Signature(s) Signed: 05/26/2019 12:59:59 PM By: [name] Entered By: [id number] on 05/26/2019 08:15:18

## 2019-05-28 ENCOUNTER — Other Ambulatory Visit: Payer: Self-pay

## 2019-05-28 DIAGNOSIS — L98492 Non-pressure chronic ulcer of skin of other sites with fat layer exposed: Secondary | ICD-10-CM | POA: Diagnosis not present

## 2019-05-28 NOTE — Progress Notes (Signed)
[name], [name] ([id number]) Visit Report for 05/28/2019 Arrival Information Details Patient Name: [name], [name] Date of Service: 05/28/2019 8:00 AM Medical Record Number: [id number] Patient Account Number: [id number] Date of Birth/Sex: [birth year] (64 y.o. F) Treating RN: Rodell Perna Primary Care Rohn Fritsch: PATIENT, NO Other Clinician: Referring Jodean Valade: Sung Amabile Treating Jesenya Bowditch/Extender: Altamese [place] in Treatment: 1 Visit Information History Since Last Visit Added or deleted any medications: No Patient Arrived: Ambulatory Any new allergies or adverse reactions: No Arrival Time: 08:08 Had a fall or experienced change in No Accompanied By: self activities of daily living that may affect Transfer Assistance: None risk of falls: Signs or symptoms of abuse/neglect since last visito No Hospitalized since last visit: No Has Dressing in Place as Prescribed: Yes Pain Present Now: No Electronic Signature(s) Signed: 05/28/2019 11:14:16 AM By: Rodell Perna Entered By: Rodell Perna on 05/28/2019 08:08:59 [name], [name] ([id number]) -------------------------------------------------------------------------------- Clinic Level of Care Assessment Details Patient Name: [name] Date of Service: 05/28/2019 8:00 AM Medical Record Number: [id number] Patient Account Number: [id number] Date of Birth/Sex: 08-04-1955 (64 y.o. F) Treating RN: Rodell Perna Primary Care Verda Mehta: PATIENT, NO Other Clinician: Referring Johnisha Louks: Sung Amabile Treating Neo Yepiz/Extender: Altamese [place] in Treatment: 1 Clinic Level of Care Assessment Items TOOL 4 Quantity Score []  - Use when only an EandM is performed on FOLLOW-UP visit 0 ASSESSMENTS - Nursing Assessment / Reassessment X - Reassessment of Co-morbidities (includes updates in patient status) 1 10 X- 1 5 Reassessment of Adherence to Treatment Plan ASSESSMENTS - Wound and Skin Assessment / Reassessment X -  Simple Wound Assessment / Reassessment - one wound 1 5 []  - 0 Complex Wound Assessment / Reassessment - multiple wounds []  - 0 Dermatologic / Skin Assessment (not related to wound area) ASSESSMENTS - Focused Assessment []  - Circumferential Edema Measurements - multi extremities 0 []  - 0 Nutritional Assessment / Counseling / Intervention []  - 0 Lower Extremity Assessment (monofilament, tuning fork, pulses) []  - 0 Peripheral Arterial Disease Assessment (using hand held doppler) ASSESSMENTS - Ostomy and/or Continence Assessment and Care []  - Incontinence Assessment and Management 0 []  - 0 Ostomy Care Assessment and Management (repouching, etc.) PROCESS - Coordination of Care X - Simple Patient / Family Education for ongoing care 1 15 []  - 0 Complex (extensive) Patient / Family Education for ongoing care X- 1 10 Staff obtains , Records, Test Results / Process Orders []  - 0 Staff telephones HHA, Nursing Homes / Clarify orders / etc []  - 0 Routine Transfer to another Facility (non-emergent condition) []  - 0 Routine Hospital Admission (non-emergent condition) []  - 0 New Admissions / / Ordering NPWT, Apligraf, etc. []  - 0 Emergency Hospital Admission (emergent condition) X- 1 10 Simple Discharge Coordination [name], [name] ( ) []  - 0 Complex (extensive) Discharge Coordination PROCESS - Special Needs []  - Pediatric / Minor Patient Management 0 []  - 0 Isolation Patient Management []  - 0 Hearing / Language / Visual special needs []  - 0 Assessment of Community assistance (transportation, D/C planning, etc.) []  - 0 Additional assistance / Altered mentation []  - 0 Support Surface(s) Assessment (bed, cushion, seat, etc.) INTERVENTIONS - Wound Cleansing / Measurement X - Simple Wound Cleansing - one wound 1 5 []  - 0 Complex Wound Cleansing - multiple wounds X- 1 5 Wound Imaging (photographs - any number of wounds) []  - 0 Wound  Tracing (instead of photographs) X- 1 5 Simple Wound Measurement - one wound []  - 0 Complex Wound Measurement - multiple wounds INTERVENTIONS -  Wound Dressings []  - Small Wound Dressing one or multiple wounds 0 X- 1 15 Medium Wound Dressing one or multiple wounds []  - 0 Large Wound Dressing one or multiple wounds []  - 0 Application of Medications - topical []  - 0 Application of Medications - injection INTERVENTIONS - Miscellaneous []  - External ear exam 0 []  - 0 Specimen Collection (cultures, biopsies, blood, body fluids, etc.) []  - 0 Specimen(s) / Culture(s) sent or taken to Lab for analysis []  - 0 Patient Transfer (multiple staff / Harrel Lemon Lift / Similar devices) []  - 0 Simple Staple / Suture removal (25 or less) []  - 0 Complex Staple / Suture removal (26 or more) []  - 0 Hypo / Hyperglycemic Management (close monitor of Blood Glucose) []  - 0 Ankle / Brachial Index (ABI) - do not check if billed separately []  - 0 Vital Signs [name], [name] ([id number]) Has the patient been seen at the hospital within the last three years: Yes Total Score: 85 Level Of Care: New/Established - Level 3 Electronic Signature(s) Signed: 05/28/2019 11:14:16 AM By: Army Melia Entered By: Army Melia on 05/28/2019 08:10:12 [name], [name] ([id number]) -------------------------------------------------------------------------------- Encounter Discharge Information Details Patient Name: [name] Date of Service: 05/28/2019 8:00 AM Medical Record Number: [id number] Patient Account Number: [id number] Date of Birth/Sex: 1955-10-10 (63 y.o. F) Treating RN: Army Melia Primary Care Cierria Height: PATIENT, NO Other Clinician: Referring Jidenna Figgs: Benjamine Sprague Treating Ran Tullis/Extender: Tito Dine in Treatment: 1 Encounter Discharge Information Items Discharge Condition: Stable Ambulatory Status: Ambulatory Discharge Destination: Home Transportation: Private  Auto Accompanied By: self Schedule Follow-up Appointment: Yes Clinical Summary of Care: Electronic Signature(s) Signed: 05/28/2019 11:14:16 AM By: Army Melia Entered By: Army Melia on 05/28/2019 08:09:46 [place], [name] ([id number]) -------------------------------------------------------------------------------- Wound Assessment Details Patient Name: [name] Date of Service: 05/28/2019 8:00 AM Medical Record Number: [id number] Patient Account Number: [id number] Date of Birth/Sex: 1956-02-21 (63 y.o. F) Treating RN: Army Melia Primary Care Suzetta Timko: PATIENT, NO Other Clinician: Referring Bexley Laubach: Benjamine Sprague Treating Viviana Trimble/Extender: Tito Dine in Treatment: 1 Wound Status Wound Number: 1 Primary Etiology: Abscess Wound Location: Right Axilla Wound Status: Open Wounding Event: Gradually Appeared Date Acquired: 05/02/2019 Weeks Of Treatment: 1 Clustered Wound: No Wound Measurements Length: (cm) 0.3 Width: (cm) 3 Depth: (cm) 1 Area: (cm) 0.707 Volume: (cm) 0.707 % Reduction in Area: 43.8% % Reduction in Volume: 79.9% Wound Description Full Thickness Without Exposed Support Classification: Structures Treatment Notes Wound #1 (Right Axilla) Notes silvercel, drawtext, BFD Electronic Signature(s) Signed: 05/28/2019 11:14:16 AM By: Army Melia Entered By: Army Melia on 05/28/2019 08:09:13

## 2019-05-30 ENCOUNTER — Encounter: Payer: Medicare Other | Admitting: Physician Assistant

## 2019-05-30 ENCOUNTER — Other Ambulatory Visit: Payer: Self-pay

## 2019-05-30 DIAGNOSIS — L98492 Non-pressure chronic ulcer of skin of other sites with fat layer exposed: Secondary | ICD-10-CM | POA: Diagnosis not present

## 2019-05-30 NOTE — Progress Notes (Signed)
MELROSE, KEARSE ([id number]) Visit Report for 05/30/2019 Arrival Information Details Patient Name: [name], [name] Date of Service: 05/30/2019 8:15 AM Medical Record Number: [id number] Patient Account Number: [id number] Date of Birth/Sex: 1955-05-22 (64 y.o. F) Treating RN: Army Melia Primary Care Markee Remlinger: PATIENT, NO Other Clinician: Referring Brisia Schuermann: Benjamine Sprague Treating Laverda Stribling/Extender: Melburn Hake, HOYT Weeks in Treatment: 2 Visit Information History Since Last Visit Added or deleted any medications: No Patient Arrived: Cane Any new allergies or adverse reactions: No Arrival Time: 08:11 Had a fall or experienced change in No Accompanied By: self activities of daily living that may affect Transfer Assistance: None risk of falls: Patient Identification Verified: Yes Signs or symptoms of abuse/neglect since last visito No Hospitalized since last visit: No Has Dressing in Place as Prescribed: Yes Pain Present Now: No Electronic Signature(s) Signed: 05/30/2019 12:27:29 PM By: Army Melia Entered By: Army Melia on 05/30/2019 08:12:04 [name], [name] ([id number]) -------------------------------------------------------------------------------- Clinic Level of Care Assessment Details Patient Name: [name] Date of Service: 05/30/2019 8:15 AM Medical Record Number: [id number] Patient Account Number: [id number] Date of Birth/Sex: September 30, 1955 (63 y.o. F) Treating RN: Army Melia Primary Care Particia Strahm: PATIENT, NO Other Clinician: Referring Kieley Akter: Benjamine Sprague Treating Alekzander Cardell/Extender: Melburn Hake, HOYT Weeks in Treatment: 2 Clinic Level of Care Assessment Items TOOL 4 Quantity Score _0  - Use when only an EandM is performed on FOLLOW-UP visit 0 ASSESSMENTS - Nursing Assessment / Reassessment X - Reassessment of Co-morbidities (includes updates in patient status) 1 10 X- 1 5 Reassessment of Adherence to Treatment Plan ASSESSMENTS - Wound and Skin  Assessment / Reassessment X - Simple Wound Assessment / Reassessment - one wound 1 5 _1  - 0 Complex Wound Assessment / Reassessment - multiple wounds _2  - 0 Dermatologic / Skin Assessment (not related to wound area) ASSESSMENTS - Focused Assessment _3  - Circumferential Edema Measurements - multi extremities 0 _4  - 0 Nutritional Assessment / Counseling / Intervention _5  - 0 Lower Extremity Assessment (monofilament, tuning fork, pulses) _6  - 0 Peripheral Arterial Disease Assessment (using hand held doppler) ASSESSMENTS - Ostomy and/or Continence Assessment and Care _7  - Incontinence Assessment and Management 0 _8  - 0 Ostomy Care Assessment and Management (repouching, etc.) PROCESS - Coordination of Care X - Simple Patient / Family Education for ongoing care 1 15 _9  - 0 Complex (extensive) Patient / Family Education for ongoing care X- 1 10 Staff obtains Programmer, systems, Records, Test Results / Process Orders _10  - 0 Staff telephones HHA, Nursing Homes / Clarify orders / etc _11  - 0 Routine Transfer to another Facility (non-emergent condition) _12  - 0 Routine Hospital Admission (non-emergent condition) _13  - 0 New Admissions / Biomedical engineer / Ordering NPWT, Apligraf, etc. _14  - 0 Emergency Hospital Admission (emergent condition) X- 1 10 Simple Discharge Coordination [name], [name] ([id number]) _15  - 0 Complex (extensive) Discharge Coordination PROCESS - Special Needs _16  - Pediatric / Minor Patient Management 0 _17  - 0 Isolation Patient Management _18  - 0 Hearing / Language / Visual special needs _19  - 0 Assessment of Community assistance (transportation, D/C planning, etc.) _20  - 0 Additional assistance / Altered mentation _21  - 0 Support Surface(s) Assessment (bed, cushion, seat, etc.) INTERVENTIONS - Wound Cleansing / Measurement X - Simple Wound Cleansing - one wound 1 5 _22  - 0 Complex Wound Cleansing - multiple wounds X- 1 5 Wound Imaging (photographs - any  number of wounds) _23  - 0 Wound Tracing (instead of photographs) X- 1 5 Simple Wound Measurement - one wound _24  - 0 Complex Wound Measurement -  multiple wounds INTERVENTIONS - Wound Dressings _0  - Small Wound Dressing one or multiple wounds 0 X- 1 15 Medium Wound Dressing one or multiple wounds _1  - 0 Large Wound Dressing one or multiple wounds <GLOVFIEPPIRJJOAC>_1<\/YSAYTKZSWFUXNATF>_5  - 0 Application of Medications - topical <DDUKGURKYHCWCBJS>_2<\/GBTDVVOHYWVPXTGG>_2  - 0 Application of Medications - injection INTERVENTIONS - Miscellaneous _4  - External ear exam 0 _5  - 0 Specimen Collection (cultures, biopsies, blood, body fluids, etc.) _6  - 0 Specimen(s) / Culture(s) sent or taken to Lab for analysis _7  - 0 Patient Transfer (multiple staff / Harrel Lemon Lift / Similar devices) _8  - 0 Simple Staple / Suture removal (25 or less) _9  - 0 Complex Staple / Suture removal (26 or more) _10  - 0 Hypo / Hyperglycemic Management (close monitor of Blood Glucose) _11  - 0 Ankle / Brachial Index (ABI) - do not check if billed separately X- 1 5 Vital Signs [name], [name] ([id number]) Has the patient been seen at the hospital within the last three years: Yes Total Score: 90 Level Of Care: New/Established - Level 3 Electronic Signature(s) Signed: 05/30/2019 12:27:29 PM By: Army Melia Entered By: Army Melia on 05/30/2019 08:25:49 [name], [name] ([id number]) -------------------------------------------------------------------------------- Encounter Discharge Information Details Patient Name: [name] Date of Service: 05/30/2019 8:15 AM Medical Record Number: [id number] Patient Account Number: [id number] Date of Birth/Sex: Nov 29, 1955 (63 y.o. F) Treating RN: Army Melia Primary Care Lynn Recendiz: PATIENT, NO Other Clinician: Referring Jakson Delpilar: Benjamine Sprague Treating Oralia Criger/Extender: Melburn Hake, HOYT Weeks in Treatment: 2 Encounter Discharge Information Items Discharge Condition: Stable Ambulatory Status: Ambulatory Discharge Destination:  Home Transportation: Private Auto Accompanied By: self Schedule Follow-up Appointment: Yes Clinical Summary of Care: Electronic Signature(s) Signed: 05/30/2019 12:27:29 PM By: Army Melia Entered By: Army Melia on 05/30/2019 08:28:43 [place], [name] ([id number]) -------------------------------------------------------------------------------- Lower Extremity Assessment Details Patient Name: [name] Date of Service: 05/30/2019 8:15 AM Medical Record Number: [id number] Patient Account Number: [id number] Date of Birth/Sex: Apr 02, 1956 (63 y.o. F) Treating RN: Army Melia Primary Care Nikelle Malatesta: PATIENT, NO Other Clinician: Referring Shaleena Crusoe: Benjamine Sprague Treating Ksenia Kunz/Extender: Melburn Hake, HOYT Weeks in Treatment: 2 Electronic Signature(s) Signed: 05/30/2019 12:27:29 PM By: Army Melia Entered By: Army Melia on 05/30/2019 08:16:22 [name], [name] ([id number]) -------------------------------------------------------------------------------- Multi Wound Chart Details Patient Name: [name] Date of Service: 05/30/2019 8:15 AM Medical Record Number: [id number] Patient Account Number: [id number] Date of Birth/Sex: 10-02-1955 (63 y.o. F) Treating RN: Army Melia Primary Care Taressa Rauh: PATIENT, NO Other Clinician: Referring Claudie Rathbone: Benjamine Sprague Treating Earlene Bjelland/Extender: STONE III, HOYT Weeks in Treatment: 2 Vital Signs Height(in): 64 Pulse(bpm): 54 Weight(lbs): 190 Blood Pressure(mmHg): 162/96 Body Mass Index(BMI): 33 Temperature(F): 98.0 Respiratory Rate 16 (breaths/min): Photos: [N/A:N/A] Wound Location: Right Axilla N/A N/A Wounding Event: Gradually Appeared N/A N/A Primary Etiology: Abscess N/A N/A Comorbid History: Cataracts, Chronic Obstructive N/A N/A Pulmonary Disease (COPD), Hypertension, Myocardial Infarction Date Acquired: 05/02/2019 N/A N/A Weeks of Treatment: 2 N/A N/A Wound Status: Open N/A N/A Measurements L x W x D  0.1x2x1.4 N/A N/A (cm) Area (cm) : 0.157 N/A N/A Volume (cm) : 0.22 N/A N/A % Reduction in Area: 87.50% N/A N/A % Reduction in Volume: 93.70% N/A N/A Classification: Full Thickness Without N/A N/A Exposed Support Structures Exudate Amount: Medium N/A N/A Exudate Type: Serosanguineous N/A N/A Exudate Color: red, brown N/A N/A Granulation Amount: Large (67-100%) N/A N/A Granulation Quality: Red N/A N/A Necrotic Amount: None Present (0%) N/A N/A Exposed Structures: Fat Layer (Subcutaneous N/A N/A Tissue) Exposed: Yes Fascia: No Tendon: No Muscle: No Hodo, Raetta ([id number]) Joint: No Bone: No Treatment Notes  Electronic Signature(s) Signed: 05/30/2019 12:27:29 PM By: Army Melia Entered By: Army Melia on 05/30/2019 08:24:39 [name], [name] ([id number]) -------------------------------------------------------------------------------- Multi-Disciplinary Care Plan Details Patient Name: [name] Date of Service: 05/30/2019 8:15 AM Medical Record Number: [id number] Patient Account Number: [id number] Date of Birth/Sex: Apr 29, 1956 (63 y.o. F) Treating RN: Army Melia Primary Care Eiden Bagot: PATIENT, NO Other Clinician: Referring Effie Janoski: Benjamine Sprague Treating Rashanna Christiana/Extender: Melburn Hake, HOYT Weeks in Treatment: 2 Active Inactive Abuse / Safety / Falls / Self Care Management Nursing Diagnoses: Potential for falls Goals: Patient will remain injury free related to falls Date Initiated: 05/16/2019 Target Resolution Date: 08/09/2019 Goal Status: Active Interventions: Assess fall risk on admission and as needed Notes: Orientation to the Wound Care Program Nursing Diagnoses: Knowledge deficit related to the wound healing center program Goals: Patient/caregiver will verbalize understanding of the [facility] Date Initiated: 05/16/2019 Target Resolution Date: 08/09/2019 Goal Status: Active Interventions: Provide education on orientation to  the wound center Notes: Pain, Acute or Chronic Nursing Diagnoses: Pain, acute or chronic: actual or potential Goals: Patient/caregiver will verbalize comfort level met Date Initiated: 05/16/2019 Target Resolution Date: 08/09/2019 Goal Status: Active Interventions: Complete pain assessment as per visit requirements [name], [name] ([id number]) Notes: Soft Tissue Infection Nursing Diagnoses: Impaired tissue integrity Goals: Patient will remain free of wound infection Date Initiated: 05/16/2019 Target Resolution Date: 08/09/2019 Goal Status: Active Interventions: Assess signs and symptoms of infection every visit Notes: Wound/Skin Impairment Nursing Diagnoses: Impaired tissue integrity Goals: Ulcer/skin breakdown will heal within 14 weeks Date Initiated: 05/16/2019 Target Resolution Date: 08/09/2019 Goal Status: Active Interventions: Assess patient/caregiver ability to obtain necessary supplies Assess patient/caregiver ability to perform ulcer/skin care regimen upon admission and as needed Assess ulceration(s) every visit Notes: Electronic Signature(s) Signed: 05/30/2019 12:27:29 PM By: Army Melia Entered By: Army Melia on 05/30/2019 08:24:32 [name], [name] ([id number]) -------------------------------------------------------------------------------- Pain Assessment Details Patient Name: [name] Date of Service: 05/30/2019 8:15 AM Medical Record Number: [id number] Patient Account Number: [id number] Date of Birth/Sex: Feb 17, 1956 (63 y.o. F) Treating RN: Army Melia Primary Care Jihan Rudy: PATIENT, NO Other Clinician: Referring Christophe Rising: Benjamine Sprague Treating Makia Bossi/Extender: Melburn Hake, HOYT Weeks in Treatment: 2 Active Problems Location of Pain Severity and Description of Pain Patient Has Paino No Site Locations Pain Management and Medication Current Pain Management: Electronic Signature(s) Signed: 05/30/2019 12:27:29 PM By: Army Melia Entered  By: Army Melia on 05/30/2019 08:12:21 [name], [name] ([id number]) -------------------------------------------------------------------------------- Patient/Caregiver Education Details Patient Name: [name] Date of Service: 05/30/2019 8:15 AM Medical Record Number: [id number] Patient Account Number: [id number] Date of Birth/Gender: 1956/03/21 (63 y.o. F) Treating RN: Army Melia Primary Care Physician: PATIENT, NO Other Clinician: Referring Physician: Benjamine Sprague Treating Physician/Extender: Sharalyn Ink in Treatment: 2 Education Assessment Education Provided To: Patient Education Topics Provided Wound/Skin Impairment: Handouts: Caring for Your Ulcer Methods: Demonstration, Explain/Verbal Responses: State content correctly Electronic Signature(s) Signed: 05/30/2019 12:27:29 PM By: Army Melia Entered By: Army Melia on 05/30/2019 08:25:59 [name], [name] ([id number]) -------------------------------------------------------------------------------- Wound Assessment Details Patient Name: [name] Date of Service: 05/30/2019 8:15 AM Medical Record Number: [id number] Patient Account Number: [id number] Date of Birth/Sex: 06/06/1955 (63 y.o. F) Treating RN: Army Melia Primary Care Eliam Snapp: PATIENT, NO Other Clinician: Referring Calven Gilkes: Benjamine Sprague Treating Devan Babino/Extender: STONE III, HOYT Weeks in Treatment: 2 Wound Status Wound Number: 1 Primary Abscess Etiology: Wound Location: Right Axilla Wound Open Wounding Event: Gradually Appeared Status: Date Acquired: 05/02/2019 Comorbid Cataracts, Chronic Obstructive Pulmonary Weeks Of Treatment: 2 History: Disease (COPD), Hypertension, Myocardial Clustered Wound: No Infarction Photos  Wound Measurements Length: (cm) 0.1 Width: (cm) 2 Depth: (cm) 1.4 Area: (cm) 0.157 Volume: (cm) 0.22 % Reduction in Area: 87.5% % Reduction in Volume: 93.7% Tunneling: No Undermining: No Wound  Description Full Thickness Without Exposed Support Foul Classification: Structures Sloug Exudate Medium Amount: Exudate Type: Serosanguineous Exudate Color: red, brown Odor After Cleansing: No h/Fibrino Yes Wound Bed Granulation Amount: Large (67-100%) Exposed Structure Granulation Quality: Red Fascia Exposed: No Necrotic Amount: None Present (0%) Fat Layer (Subcutaneous Tissue) Exposed: Yes Tendon Exposed: No Muscle Exposed: No Joint Exposed: No Bone Exposed: No [name], [name] ([id number]) Treatment Notes Wound #1 (Right Axilla) Notes silvercel, drawtext, BFD Electronic Signature(s) Signed: 05/30/2019 12:27:29 PM By: Army Melia Entered By: Army Melia on 05/30/2019 08:16:06 [name], [name] ([id number]) -------------------------------------------------------------------------------- Vitals Details Patient Name: [name] Date of Service: 05/30/2019 8:15 AM Medical Record Number: [id number] Patient Account Number: [id number] Date of Birth/Sex: [birth year] (64 y.o. F) Treating RN: Army Melia Primary Care Naveena Eyman: PATIENT, NO Other Clinician: Referring Diarra Ceja: Benjamine Sprague Treating Sorin Frimpong/Extender: STONE III, HOYT Weeks in Treatment: 2 Vital Signs Time Taken: 08:12 Temperature (F): 98.0 Height (in): 64 Pulse (bpm): 59 Weight (lbs): 190 Respiratory Rate (breaths/min): 16 Body Mass Index (BMI): 32.6 Blood Pressure (mmHg): 162/96 Reference Range: 80 - 120 mg / dl Electronic Signature(s) Signed: 05/30/2019 12:27:29 PM By: Army Melia Entered By: Army Melia on 05/30/2019 08:13:00

## 2019-05-30 NOTE — Progress Notes (Signed)
CONSWELLO, PEVEY ([id number]) Visit Report for 05/30/2019 Chief Complaint Document Details Patient Name: [name], [name] Date of Service: 05/30/2019 8:15 AM Medical Record Number: [id number] Patient Account Number: [id number] Date of Birth/Sex: 28-Nov-1955 (64 y.o. F) Treating RN: Huel Coventry Primary Care Provider: PATIENT, NO Other Clinician: Referring Provider: Sung Amabile Treating Provider/Extender: Linwood Dibbles, Zaine Elsass Weeks in Treatment: 2 Information Obtained from: Patient Chief Complaint Right Axilla abscess and ulcer Electronic Signature(s) Signed: 05/30/2019 8:39:41 AM By: Lenda Kelp PA-C Entered By: Lenda Kelp on 05/30/2019 08:13:46 Rasch, [name] ([id number]) -------------------------------------------------------------------------------- HPI Details Patient Name: [name] Date of Service: 05/30/2019 8:15 AM Medical Record Number: [id number] Patient Account Number: [id number] Date of Birth/Sex: 1955-10-25 (63 y.o. F) Treating RN: Huel Coventry Primary Care Provider: PATIENT, NO Other Clinician: Referring Provider: Sung Amabile Treating Provider/Extender: Linwood Dibbles, Solaris Kram Weeks in Treatment: 2 History of Present Illness HPI Description: 05/16/2019 patient presents today for initial evaluation in our clinic concerning a abscess she had in the right axillary region. This was subsequently evaluated by Dr. Tonna Boehringer in the hospital and he did have to open this up in order to drain the abscess. Subsequent to that it was recommended that the patient have this packed on a regular basis with saline wet-to- dry dressings. Unfortunately the patient does not have anyone who can really help her with this. Only her son is apparently in the picture and she states that he would "pass out" if he were to have to do this. Nonetheless she has been in a very tight spot she actually is gone back to the hospital to have this changed at one point and then subsequently they  recommended she go back to the surgeon Dr. Tonna Boehringer actually referred her to [place] to help manage the patient's wound. With that being said currently she states that she does not have any discomfort except for if the area is being packed with the packing is removed. She does have a history of hypertension but currently states that that is typically under pretty good control. With regard to the wound itself I do believe that this is something that is going to need to be packed on a more regular basis. This may and tell us having to do this some here in the clinic and then potentially getting her home health as well if were able to do so. I do think it is good to be of utmost importance however to keep this packing changed if at all possible daily although weekends are good to be difficult no matter what. No fevers, chills, nausea, vomiting, or diarrhea. 05/23/2019 upon evaluation today patient actually appears to be doing excellent in regard to her wound. She has been tolerating the dressing changes with the silver alginate without complication. Fortunately I am very pleased with the progress she is made this seems to be measuring better and in just 1 week's time is much cleaner she also tells me is not hurting like it was previous which is also excellent news. All in all I do not see anything that really has me concerned at this point which is great news as well. 05/30/2019 on evaluation today patient appears to be doing very well with regard to her ulcer. She has been tolerating the dressing changes without complication. This is measuring much better as far as the overall size is also measuring better as far as depth. Overall I feel like we are seeing good improvement. Potentially we could consider a snap VAC that may speed things up  even better for her. With that being said I am unsure whether her insurance would cover this. Nonetheless we can definitely continue with where things are now as far as  treatment is concerned. Electronic Signature(s) Signed: 05/30/2019 8:28:07 AM By: Worthy Keeler PA-C Entered By: Worthy Keeler on 05/30/2019 08:28:07 Lautner, [name] ([id number]) -------------------------------------------------------------------------------- Physical Exam Details Patient Name: [name] Date of Service: 05/30/2019 8:15 AM Medical Record Number: [id number] Patient Account Number: [id number] Date of Birth/Sex: 1955/09/28 (63 y.o. F) Treating RN: Cornell Barman Primary Care Provider: PATIENT, NO Other Clinician: Referring Provider: Benjamine Sprague Treating Provider/Extender: STONE III, Blue Ruggerio Weeks in Treatment: 2 Constitutional Well-nourished and well-hydrated in no acute distress. Respiratory normal breathing without difficulty. Psychiatric this patient is able to make decisions and demonstrates good insight into disease process. Alert and Oriented x 3. pleasant and cooperative. Notes Patient's wound bed currently showed signs of good granulation at this time that did not appear to be any evidence of active infection she has a lot of new epithelization around the edges of the wound and overall I feel like this is quite healthy. No sharp debridement was necessary at this point. The wound is still requiring packing as far as try to get this to fill in potentially we may look into checking into a snap VAC for her. Electronic Signature(s) Signed: 05/30/2019 8:28:33 AM By: Worthy Keeler PA-C Entered By: Worthy Keeler on 05/30/2019 08:28:33 Raulston, [name] ([id number]) -------------------------------------------------------------------------------- Physician Orders Details Patient Name: [name] Date of Service: 05/30/2019 8:15 AM Medical Record Number: [id number] Patient Account Number: [id number] Date of Birth/Sex: February 27, 1956 (63 y.o. F) Treating RN: Army Melia Primary Care Provider: PATIENT, NO Other Clinician: Referring Provider: Benjamine Sprague Treating Provider/Extender: Melburn Hake, Haileyann Staiger Weeks in Treatment: 2 Verbal / Phone Orders: No Diagnosis Coding ICD-10 Coding Code Description L02.411 Cutaneous abscess of right axilla L98.492 Non-pressure chronic ulcer of skin of other sites with fat layer exposed I10 Essential (primary) hypertension Wound Cleansing Wound #1 Right Axilla o Clean wound with Normal Saline. o May shower with protection. - Do not get your wound wet Primary Wound Dressing Wound #1 Right Axilla o Silver Alginate Secondary Dressing Wound #1 Right Axilla o Boardered Foam Dressing o Drawtex Dressing Change Frequency Wound #1 Right Axilla o Change dressing every day. - Mondays, Wednesdays and Fridays in clinic Follow-up Appointments o Return Appointment in 1 week. o Nurse Visit as needed [place] #1 Right [place] Visits - [facility] Nurse may visit PRN to address patientos wound care needs. o FACE TO FACE ENCOUNTER: MEDICARE and MEDICAID PATIENTS: I certify that this patient is under my care and that I had a face-to-face encounter that meets the physician face-to-face encounter requirements with this patient on this date. The encounter with the patient was in whole or in part for the following MEDICAL CONDITION: (primary reason for [place]) MEDICAL NECESSITY: I certify, that based on my findings, NURSING services are a medically necessary home health service. HOME BOUND STATUS: I certify that my clinical findings support that this patient is homebound (i.e., Due to illness or injury, pt requires aid of supportive devices such as crutches, cane, wheelchairs, walkers, the use of special transportation or the assistance of another person to leave their place of residence. There is a normal inability to leave the home and doing so requires considerable and taxing effort. Other absences are for medical reasons / religious services  and are infrequent or of  short duration when for other reasons). Nemetz, [name] ([id number]) o If current dressing causes regression in wound condition, may D/C ordered dressing product/s and apply Normal Saline Moist Dressing daily until next Wound Healing Center / Other MD appointment. Notify Wound Healing Center of regression in wound condition at [phone]. o Please direct any NON-WOUND related issues/requests for orders to patient's Primary Care Physician Electronic Signature(s) Signed: 05/30/2019 8:39:41 AM By: Lenda Kelp PA-C Signed: 05/30/2019 12:27:29 PM By: Rodell Perna Entered By: Rodell Perna on 05/30/2019 08:25:22 Raatz, [name] ([id number]) -------------------------------------------------------------------------------- Problem List Details Patient Name: [name] Date of Service: 05/30/2019 8:15 AM Medical Record Number: [id number] Patient Account Number: [id number] Date of Birth/Sex: [birth year] (63 y.o. F) Treating RN: Huel Coventry Primary Care Provider: PATIENT, NO Other Clinician: Referring Provider: Sung Amabile Treating Provider/Extender: Linwood Dibbles, Kathlen Sakurai Weeks in Treatment: 2 Active Problems ICD-10 Evaluated Encounter Code Description Active Date Today Diagnosis L02.411 Cutaneous abscess of right axilla 05/16/2019 No Yes L98.492 Non-pressure chronic ulcer of skin of other sites with fat layer 05/16/2019 No Yes exposed I10 Essential (primary) hypertension 05/16/2019 No Yes Inactive Problems Resolved Problems Electronic Signature(s) Signed: 05/30/2019 8:39:41 AM By: Lenda Kelp PA-C Entered By: Lenda Kelp on 05/30/2019 08:13:37 Izard, [name] ([id number]) -------------------------------------------------------------------------------- Progress Note Details Patient Name: [name] Date of Service: 05/30/2019 8:15 AM Medical Record Number: [id number] Patient Account Number: [id number] Date of Birth/Sex: [birth year] (63  y.o. F) Treating RN: Huel Coventry Primary Care Provider: PATIENT, NO Other Clinician: Referring Provider: Sung Amabile Treating Provider/Extender: Linwood Dibbles, Nala Kachel Weeks in Treatment: 2 Subjective Chief Complaint Information obtained from Patient Right Axilla abscess and ulcer History of Present Illness (HPI) 05/16/2019 patient presents today for initial evaluation in our clinic concerning a abscess she had in the right axillary region. This was subsequently evaluated by Dr. Tonna Boehringer in the hospital and he did have to open this up in order to drain the abscess. Subsequent to that it was recommended that the patient have this packed on a regular basis with saline wet-to-dry dressings. Unfortunately the patient does not have anyone who can really help her with this. Only her son is apparently in the picture and she states that he would "pass out" if he were to have to do this. Nonetheless she has been in a very tight spot she actually is gone back to the hospital to have this changed at one point and then subsequently they recommended she go back to the surgeon Dr. Tonna Boehringer actually referred her to [place] to help manage the patient's wound. With that being said currently she states that she does not have any discomfort except for if the area is being packed with the packing is removed. She does have a history of hypertension but currently states that that is typically under pretty good control. With regard to the wound itself I do believe that this is something that is going to need to be packed on a more regular basis. This may and tell us having to do this some here in the clinic and then potentially getting her home health as well if were able to do so. I do think it is good to be of utmost importance however to keep this packing changed if at all possible daily although weekends are good to be difficult no matter what. No fevers, chills, nausea, vomiting, or diarrhea. 05/23/2019 upon evaluation today  patient actually appears to be doing excellent in regard to her wound. She has been tolerating the dressing changes with the  silver alginate without complication. Fortunately I am very pleased with the progress she is made this seems to be measuring better and in just 1 week's time is much cleaner she also tells me is not hurting like it was previous which is also excellent news. All in all I do not see anything that really has me concerned at this point which is great news as well. 05/30/2019 on evaluation today patient appears to be doing very well with regard to her ulcer. She has been tolerating the dressing changes without complication. This is measuring much better as far as the overall size is also measuring better as far as depth. Overall I feel like we are seeing good improvement. Potentially we could consider a snap VAC that may speed things up even better for her. With that being said I am unsure whether her insurance would cover this. Nonetheless we can definitely continue with where things are now as far as treatment is concerned. Objective Constitutional Well-nourished and well-hydrated in no acute distress. Vitals Time Taken: 8:12 AM, Height: 64 in, Weight: 190 lbs, BMI: 32.6, Temperature: 98.0 F, Pulse: 59 bpm, Respiratory Rate: 16 breaths/min, Blood Pressure: 162/96 mmHg. Huckins, [place]e ([id number]) Respiratory normal breathing without difficulty. Psychiatric this patient is able to make decisions and demonstrates good insight into disease process. Alert and Oriented x 3. pleasant and cooperative. General Notes: Patient's wound bed currently showed signs of good granulation at this time that did not appear to be any evidence of active infection she has a lot of new epithelization around the edges of the wound and overall I feel like this is quite healthy. No sharp debridement was necessary at this point. The wound is still requiring packing as far as try to get this to  fill in potentially we may look into checking into a snap VAC for her. Integumentary (Hair, Skin) Wound #1 status is Open. Original cause of wound was Gradually Appeared. The wound is located on the Right Axilla. The wound measures 0.1cm length x 2cm width x 1.4cm depth; 0.157cm^2 area and 0.22cm^3 volume. There is Fat Layer (Subcutaneous Tissue) Exposed exposed. There is no tunneling or undermining noted. There is a medium amount of serosanguineous drainage noted. There is large (67-100%) red granulation within the wound bed. There is no necrotic tissue within the wound bed. Assessment Active Problems ICD-10 Cutaneous abscess of right axilla Non-pressure chronic ulcer of skin of other sites with fat layer exposed Essential (primary) hypertension Plan Wound Cleansing: Wound #1 Right Axilla: Clean wound with Normal Saline. May shower with protection. - Do not get your wound wet Primary Wound Dressing: Wound #1 Right Axilla: Silver Alginate Secondary Dressing: Wound #1 Right Axilla: Boardered Foam Dressing Drawtex Dressing Change Frequency: Wound #1 Right Axilla: Change dressing every day. - Mondays, Wednesdays and Fridays in clinic Follow-up Appointments: Return Appointment in 1 week. Nurse Visit as needed JAZSMIN, COUSE ([id number]) Home Health: Wound #1 Right Axilla: Continue Home Health Visits - [facility] Nurse may visit PRN to address patient s wound care needs. FACE TO FACE ENCOUNTER: MEDICARE and MEDICAID PATIENTS: I certify that this patient is under my care and that I had a face-to-face encounter that meets the physician face-to-face encounter requirements with this patient on this date. The encounter with the patient was in whole or in part for the following MEDICAL CONDITION: (primary reason for Home Healthcare) MEDICAL NECESSITY: I certify, that based on my findings, NURSING services are a medically necessary home health service. HOME BOUND  STATUS: I  certify that my clinical findings support that this patient is homebound (i.e., Due to illness or injury, pt requires aid of supportive devices such as crutches, cane, wheelchairs, walkers, the use of special transportation or the assistance of another person to leave their place of residence. There is a normal inability to leave the home and doing so requires considerable and taxing effort. Other absences are for medical reasons / religious services and are infrequent or of short duration when for other reasons). If current dressing causes regression in wound condition, may D/C ordered dressing product/s and apply Normal Saline Moist Dressing daily until next Wound Healing Center / Other MD appointment. Notify Wound Healing Center of regression in wound condition at [phone]. Please direct any NON-WOUND related issues/requests for orders to patient's Primary Care Physician 1. My suggestion at this time is good to be that we going continue with the current wound care measures which includes packing the wound with a silver alginate dressing which seems to be doing well for her. She does need to continue packing this as such. 2. I am in a suggest as well using drawtex and a border foam dressing over top. This seems to be slowing down as far as drainage but overall she is doing excellent with this. 3. I am also going to recommend that she come in for nurse visit as needed obviously I think this is becoming less frequent and less needed. 4. I will check and see whether her insurance would potentially cover a snap VAC. With that being said I am unsure whether that would be the case especially with Medicaid better secondary I feel like she may have a responsibility in that regard. We will see patient back for reevaluation in 1 week here in the clinic. If anything worsens or changes patient will contact our office for additional recommendations. Electronic Signature(s) Signed: 05/30/2019 8:29:36 AM  By: Lenda Kelp PA-C Entered By: Lenda Kelp on 05/30/2019 08:29:36 Breitenstein, [name] ([id number]) -------------------------------------------------------------------------------- SuperBill Details Patient Name: [name] Date of Service: 05/30/2019 Medical Record Number: [id number] Patient Account Number: [id number] Date of Birth/Sex: [birth year] (63 y.o. F) Treating RN: Huel Coventry Primary Care Provider: PATIENT, NO Other Clinician: Referring Provider: Sung Amabile Treating Provider/Extender: Linwood Dibbles, Edy Belt Weeks in Treatment: 2 Diagnosis Coding ICD-10 Codes Code Description L02.411 Cutaneous abscess of right axilla L98.492 Non-pressure chronic ulcer of skin of other sites with fat layer exposed I10 Essential (primary) hypertension Facility Procedures CPT4 Code: [id number] Description: 99213 - WOUND CARE VISIT-LEV 3 EST PT Modifier: Quantity: 1 Physician Procedures CPT4 Code: [id number] Description: 99214 - WC PHYS LEVEL 4 - EST PT ICD-10 Diagnosis Description L02.411 Cutaneous abscess of right axilla L98.492 Non-pressure chronic ulcer of skin of other sites with fat l I10 Essential (primary) hypertension Modifier: ayer exposed Quantity: 1 Electronic Signature(s) Signed: 05/30/2019 8:30:29 AM By: Lenda Kelp PA-C Entered By: Lenda Kelp on 05/30/2019 08:30:29

## 2019-06-02 ENCOUNTER — Encounter: Payer: Medicare Other | Attending: Physician Assistant

## 2019-06-02 ENCOUNTER — Other Ambulatory Visit: Payer: Self-pay

## 2019-06-02 DIAGNOSIS — I252 Old myocardial infarction: Secondary | ICD-10-CM | POA: Insufficient documentation

## 2019-06-02 DIAGNOSIS — L98492 Non-pressure chronic ulcer of skin of other sites with fat layer exposed: Secondary | ICD-10-CM | POA: Diagnosis not present

## 2019-06-02 DIAGNOSIS — I10 Essential (primary) hypertension: Secondary | ICD-10-CM | POA: Insufficient documentation

## 2019-06-02 DIAGNOSIS — L02411 Cutaneous abscess of right axilla: Secondary | ICD-10-CM | POA: Diagnosis not present

## 2019-06-02 NOTE — Progress Notes (Signed)
[name], [name] ([id number]) Visit Report for 06/02/2019 Arrival Information Details Patient Name: [name] Date of Service: 06/02/2019 8:00 AM Medical Record Number: [id number] Patient Account Number: [id number] Date of Birth/Sex: [birth year] (64 y.o. F) Treating RN: [name] Primary Care [name]: PATIENT, NO Other Clinician: Referring [name]: [name] Treating [name]/Extender: [name] Weeks in Treatment: 2 Visit Information History Since Last Visit Added or deleted any medications: No Patient Arrived: Ambulatory Any new allergies or adverse reactions: No Arrival Time: 08:03 Had a fall or experienced change in No Accompanied By: self activities of daily living that may affect Transfer Assistance: None risk of falls: Patient Identification Verified: Yes Signs or symptoms of abuse/neglect since last visito No Secondary Verification Process Completed: Yes Hospitalized since last visit: No Implantable device outside of the clinic excluding No cellular tissue based products placed in the center since last visit: Has Dressing in Place as Prescribed: Yes Pain Present Now: No Notes 98.0 Electronic Signature(s) Signed: 06/02/2019 4:01:28 PM By: [name] Entered By: [name] on 06/02/2019 08:03:49 Totty, [name] ([id number]) -------------------------------------------------------------------------------- Clinic Level of Care Assessment Details Patient Name: [name] Date of Service: 06/02/2019 8:00 AM Medical Record Number: [id number] Patient Account Number: [id number] Date of Birth/Sex: May 04, 1955 (63 y.o. F) Treating RN: [name] Primary Care [name]: PATIENT, NO Other Clinician: Referring [name]: [name] Treating [name]: [name] Weeks in Treatment: 2 Clinic Level of Care Assessment Items TOOL 4 Quantity Score []  - Use when only an EandM is performed on FOLLOW-UP visit 0 ASSESSMENTS -  Nursing Assessment / Reassessment X - Reassessment of Co-morbidities (includes updates in patient status) 1 10 X- 1 5 Reassessment of Adherence to Treatment Plan ASSESSMENTS - Wound and Skin Assessment / Reassessment X - Simple Wound Assessment / Reassessment - one wound 1 5 []  - 0 Complex Wound Assessment / Reassessment - multiple wounds []  - 0 Dermatologic / Skin Assessment (not related to wound area) ASSESSMENTS - Focused Assessment []  - Circumferential Edema Measurements - multi extremities 0 []  - 0 Nutritional Assessment / Counseling / Intervention []  - 0 Lower Extremity Assessment (monofilament, tuning fork, pulses) []  - 0 Peripheral Arterial Disease Assessment (using hand held doppler) ASSESSMENTS - Ostomy and/or Continence Assessment and Care []  - Incontinence Assessment and Management 0 []  - 0 Ostomy Care Assessment and Management (repouching, etc.) PROCESS - Coordination of Care X - Simple Patient / Family Education for ongoing care 1 15 []  - 0 Complex (extensive) Patient / Family Education for ongoing care X- 1 10 Staff obtains Programmer, systems, Records, Test Results / Process Orders []  - 0 Staff telephones HHA, Nursing Homes / Clarify orders / etc []  - 0 Routine Transfer to another Facility (non-emergent condition) []  - 0 Routine Hospital Admission (non-emergent condition) []  - 0 New Admissions / Biomedical engineer / Ordering NPWT, Apligraf, etc. []  - 0 Emergency Hospital Admission (emergent condition) X- 1 10 Simple Discharge Coordination [name], [name] ([id number]) []  - 0 Complex (extensive) Discharge Coordination PROCESS - Special Needs []  - Pediatric / Minor Patient Management 0 []  - 0 Isolation Patient Management []  - 0 Hearing / Language / Visual special needs []  - 0 Assessment of Community assistance (transportation, D/C planning, etc.) []  - 0 Additional assistance / Altered mentation []  - 0 Support Surface(s) Assessment (bed, cushion, seat,  etc.) INTERVENTIONS - Wound Cleansing / Measurement X - Simple Wound Cleansing - one wound 1 5 []  - 0 Complex Wound Cleansing - multiple wounds X- 1 5 Wound Imaging (photographs - any  number of wounds) []  - 0 Wound Tracing (instead of photographs) X- 1 5 Simple Wound Measurement - one wound []  - 0 Complex Wound Measurement - multiple wounds INTERVENTIONS - Wound Dressings X - Small Wound Dressing one or multiple wounds 1 10 []  - 0 Medium Wound Dressing one or multiple wounds []  - 0 Large Wound Dressing one or multiple wounds []  - 0 Application of Medications - topical []  - 0 Application of Medications - injection INTERVENTIONS - Miscellaneous []  - External ear exam 0 []  - 0 Specimen Collection (cultures, biopsies, blood, body fluids, etc.) []  - 0 Specimen(s) / Culture(s) sent or taken to Lab for analysis []  - 0 Patient Transfer (multiple staff / / Similar devices) []  - 0 Simple Staple / Suture removal (25 or less) []  - 0 Complex Staple / Suture removal (26 or more) []  - 0 Hypo / Hyperglycemic Management (close monitor of Blood Glucose) []  - 0 Ankle / Brachial Index (ABI) - do not check if billed separately []  - 0 Vital Signs [name], [name] ( ) Has the patient been seen at the hospital within the last three years: Yes Total Score: 80 Level Of Care: New/Established - Level 3 Electronic Signature(s) Signed: 06/02/2019 4:01:28 PM By: Entered By: on 06/02/2019 08:10:57 [name], ( ) -------------------------------------------------------------------------------- Encounter Discharge Information Details Patient Name: Date of Service: 06/02/2019 8:00 AM Medical Record Number: Nurse, adult Patient Account Number: Date of Birth/Sex: 10-31-1955 (63 y.o. F) Treating RN: Primary Care Anessia Oakland: PATIENT, NO Other Clinician: Referring Harutyun Monteverde: Treating  Karin Griffith/Extender: [id number], [name] Weeks in Treatment: 2 Encounter Discharge Information Items Discharge Condition: Stable Ambulatory Status: Ambulatory Discharge Destination: Home Transportation: Private Auto Accompanied By: self Schedule Follow-up Appointment: Yes Clinical Summary of Care: Electronic Signature(s) Signed: 06/02/2019 4:01:28 PM By: Curtis Sites Entered By: Curtis Sites on 06/02/2019 08:10:35 Stencil, [name] ([id number]) -------------------------------------------------------------------------------- Wound Assessment Details Patient Name: [name] Date of Service: 06/02/2019 8:00 AM Medical Record Number: [id number] Patient Account Number: [id number] Date of Birth/Sex: Jan 17, 1956 (63 y.o. F) Treating RN: Curtis Sites Primary Care Arionne Iams: PATIENT, NO Other Clinician: Referring Kei Langhorst: Sung Amabile Treating Johnsie Moscoso/Extender: STONE III, [name] Weeks in Treatment: 2 Wound Status Wound Number: 1 Primary Abscess Etiology: Wound Location: Right Axilla Wound Open Wounding Event: Gradually Appeared Status: Date Acquired: 05/02/2019 Comorbid Cataracts, Chronic Obstructive Pulmonary Weeks Of Treatment: 2 History: Disease (COPD), Hypertension, Myocardial Clustered Wound: No Infarction Photos Wound Measurements Length: (cm) 0.1 Width: (cm) 2 Depth: (cm) 1.4 Area: (cm) 0.157 Volume: (cm) 0.22 % Reduction in Area: 87.5% % Reduction in Volume: 93.7% Epithelialization: None Tunneling: No Undermining: No Wound Description Full Thickness Without Exposed Support Foul Odo Classification: Structures Slough/F Wound Margin: Flat and Intact Exudate Medium Amount: Exudate Type: Serosanguineous Exudate Color: red, brown r After Cleansing: No ibrino Yes Wound Bed Granulation Amount: Large (67-100%) Exposed Structure Granulation Quality: Red Fascia Exposed: No Necrotic Amount: None Present (0%) Fat Layer (Subcutaneous Tissue) Exposed: Yes Tendon  Exposed: No Muscle Exposed: No Joint Exposed: No Bone Exposed: No [name], [name] (07/31/2019) Treatment Notes Wound #1 (Right Axilla) Notes silvercel, BFD Electronic Signature(s) Signed: 06/02/2019 4:01:28 PM By: Curtis Sites Entered By: 07/31/2019 on 06/02/2019 08:10:00

## 2019-06-04 ENCOUNTER — Other Ambulatory Visit: Payer: Self-pay

## 2019-06-04 ENCOUNTER — Other Ambulatory Visit: Payer: Self-pay | Admitting: Internal Medicine

## 2019-06-04 DIAGNOSIS — L02411 Cutaneous abscess of right axilla: Secondary | ICD-10-CM | POA: Diagnosis not present

## 2019-06-04 NOTE — Telephone Encounter (Signed)
Received Rx request from [facility] for Symbicort. Rx has been denied, as pt is past due for an appt.

## 2019-06-04 NOTE — Progress Notes (Signed)
[name], [name] ([id number]) Visit Report for 06/04/2019 Arrival Information Details Patient Name: [name], [name] Date of Service: 06/04/2019 8:00 AM Medical Record Number: [id number] Patient Account Number: [id number] Date of Birth/Sex: 11-27-1955 (64 y.o. F) Treating RN: Rodell Perna Primary Care Maycen Degregory: PATIENT, NO Other Clinician: Referring Marcayla Budge: Sung Amabile Treating Carson Meche/Extender: Altamese [place] in Treatment: 2 Visit Information History Since Last Visit Added or deleted any medications: No Patient Arrived: Ambulatory Any new allergies or adverse reactions: No Arrival Time: 08:07 Had a fall or experienced change in No Accompanied By: self activities of daily living that may affect Transfer Assistance: None risk of falls: Patient Identification Verified: Yes Signs or symptoms of abuse/neglect since last visito No Hospitalized since last visit: No Has Dressing in Place as Prescribed: Yes Pain Present Now: No Electronic Signature(s) Signed: 06/04/2019 9:28:55 AM By: Rodell Perna Entered By: Rodell Perna on 06/04/2019 08:07:32 Schappert, Isebella ([id number]) -------------------------------------------------------------------------------- Clinic Level of Care Assessment Details Patient Name: [name] Date of Service: 06/04/2019 8:00 AM Medical Record Number: [id number] Patient Account Number: [id number] Date of Birth/Sex: May 29, 1955 (64 y.o. F) Treating RN: Rodell Perna Primary Care Alydia Gosser: PATIENT, NO Other Clinician: Referring Maripaz Mullan: Sung Amabile Treating Paddy Neis/Extender: Altamese [employer] in Treatment: 2 Clinic Level of Care Assessment Items TOOL 4 Quantity Score []  - Use when only an EandM is performed on FOLLOW-UP visit 0 ASSESSMENTS - Nursing Assessment / Reassessment X - Reassessment of Co-morbidities (includes updates in patient status) 1 10 X- 1 5 Reassessment of Adherence to Treatment Plan ASSESSMENTS - Wound and Skin  Assessment / Reassessment X - Simple Wound Assessment / Reassessment - one wound 1 5 []  - 0 Complex Wound Assessment / Reassessment - multiple wounds []  - 0 Dermatologic / Skin Assessment (not related to wound area) ASSESSMENTS - Focused Assessment []  - Circumferential Edema Measurements - multi extremities 0 []  - 0 Nutritional Assessment / Counseling / Intervention []  - 0 Lower Extremity Assessment (monofilament, tuning fork, pulses) []  - 0 Peripheral Arterial Disease Assessment (using hand held doppler) ASSESSMENTS - Ostomy and/or Continence Assessment and Care []  - Incontinence Assessment and Management 0 []  - 0 Ostomy Care Assessment and Management (repouching, etc.) PROCESS - Coordination of Care X - Simple Patient / Family Education for ongoing care 1 15 []  - 0 Complex (extensive) Patient / Family Education for ongoing care X- 1 10 Staff obtains , Records, Test Results / Process Orders []  - 0 Staff telephones HHA, Nursing Homes / Clarify orders / etc []  - 0 Routine Transfer to another Facility (non-emergent condition) []  - 0 Routine Hospital Admission (non-emergent condition) []  - 0 New Admissions / / Ordering NPWT, Apligraf, etc. []  - 0 Emergency Hospital Admission (emergent condition) X- 1 10 Simple Discharge Coordination Chick, Maisy ( ) []  - 0 Complex (extensive) Discharge Coordination PROCESS - Special Needs []  - Pediatric / Minor Patient Management 0 []  - 0 Isolation Patient Management []  - 0 Hearing / Language / Visual special needs []  - 0 Assessment of Community assistance (transportation, D/C planning, etc.) []  - 0 Additional assistance / Altered mentation []  - 0 Support Surface(s) Assessment (bed, cushion, seat, etc.) INTERVENTIONS - Wound Cleansing / Measurement X - Simple Wound Cleansing - one wound 1 5 []  - 0 Complex Wound Cleansing - multiple wounds X- 1 5 Wound Imaging (photographs - any  number of wounds) []  - 0 Wound Tracing (instead of photographs) X- 1 5 Simple Wound Measurement - one wound []  - 0 Complex Wound Measurement -  multiple wounds INTERVENTIONS - Wound Dressings []  - Small Wound Dressing one or multiple wounds 0 X- 1 15 Medium Wound Dressing one or multiple wounds []  - 0 Large Wound Dressing one or multiple wounds []  - 0 Application of Medications - topical []  - 0 Application of Medications - injection INTERVENTIONS - Miscellaneous []  - External ear exam 0 []  - 0 Specimen Collection (cultures, biopsies, blood, body fluids, etc.) []  - 0 Specimen(s) / Culture(s) sent or taken to Lab for analysis []  - 0 Patient Transfer (multiple staff / Harrel Lemon Lift / Similar devices) []  - 0 Simple Staple / Suture removal (25 or less) []  - 0 Complex Staple / Suture removal (26 or more) []  - 0 Hypo / Hyperglycemic Management (close monitor of Blood Glucose) []  - 0 Ankle / Brachial Index (ABI) - do not check if billed separately []  - 0 Vital Signs Lorton, Lauralee ([id number]) Has the patient been seen at the hospital within the last three years: Yes Total Score: 85 Level Of Care: New/Established - Level 3 Electronic Signature(s) Signed: 06/04/2019 9:28:55 AM By: Army Melia Entered By: Army Melia on 06/04/2019 08:11:46 Layson, [name] ([id number]) -------------------------------------------------------------------------------- Encounter Discharge Information Details Patient Name: [name] Date of Service: 06/04/2019 8:00 AM Medical Record Number: [id number] Patient Account Number: [id number] Date of Birth/Sex: 1956/04/12 (63 y.o. F) Treating RN: Army Melia Primary Care Clarkson Rosselli: PATIENT, NO Other Clinician: Referring Irelyn Perfecto: Benjamine Sprague Treating Shamina Etheridge/Extender: Tito Dine in Treatment: 2 Encounter Discharge Information Items Discharge Condition: Stable Ambulatory Status: Ambulatory Discharge Destination:  Home Transportation: Private Auto Accompanied By: self Schedule Follow-up Appointment: Yes Clinical Summary of Care: Electronic Signature(s) Signed: 06/04/2019 9:28:55 AM By: Army Melia Entered By: Army Melia on 06/04/2019 08:11:19 [place], [name] ([id number]) -------------------------------------------------------------------------------- Wound Assessment Details Patient Name: [name] Date of Service: 06/04/2019 8:00 AM Medical Record Number: [id number] Patient Account Number: [id number] Date of Birth/Sex: 10-25-1955 (63 y.o. F) Treating RN: Army Melia Primary Care Saavi Mceachron: PATIENT, NO Other Clinician: Referring Mack Alvidrez: Benjamine Sprague Treating Joshus Rogan/Extender: Tito Dine in Treatment: 2 Wound Status Wound Number: 1 Primary Etiology: Abscess Wound Location: Right Axilla Wound Status: Open Wounding Event: Gradually Appeared Date Acquired: 05/02/2019 Weeks Of Treatment: 2 Clustered Wound: No Wound Measurements Length: (cm) 0.1 Width: (cm) 2 Depth: (cm) 1.3 Area: (cm) 0.157 Volume: (cm) 0.204 % Reduction in Area: 87.5% % Reduction in Volume: 94.2% Wound Description Full Thickness Without Exposed Support Classification: Structures Treatment Notes Wound #1 (Right Axilla) Notes silvercel, drawtex, BFD Electronic Signature(s) Signed: 06/04/2019 9:28:55 AM By: Army Melia Entered By: Army Melia on 06/04/2019 08:10:41

## 2019-06-05 ENCOUNTER — Ambulatory Visit: Payer: Medicare Other | Admitting: Nurse Practitioner

## 2019-06-06 ENCOUNTER — Encounter: Payer: Medicare Other | Admitting: Physician Assistant

## 2019-06-06 ENCOUNTER — Other Ambulatory Visit: Payer: Self-pay

## 2019-06-06 DIAGNOSIS — L02411 Cutaneous abscess of right axilla: Secondary | ICD-10-CM | POA: Diagnosis not present

## 2019-06-06 NOTE — Progress Notes (Signed)
[name] ([id number]) Visit Report for 06/06/2019 Chief Complaint Document Details Patient Name: [name], [name] Date of Service: 06/06/2019 8:15 AM Medical Record Number: [id number] Patient Account Number: [id number] Date of Birth/Sex: Jun 20, 1955 (64 y.o. F) Treating RN: Curtis Sites Primary Care Provider: PATIENT, NO Other Clinician: Referring Provider: Sung Amabile Treating Provider/Extender: Linwood Dibbles, Idan Prime Weeks in Treatment: 3 Information Obtained from: Patient Chief Complaint Right Axilla abscess and ulcer Electronic Signature(s) Signed: 06/06/2019 1:53:22 PM By: Lenda Kelp PA-C Entered By: Lenda Kelp on 06/06/2019 08:12:20 [name], [name] ([id number]) -------------------------------------------------------------------------------- HPI Details Patient Name: [name] Date of Service: 06/06/2019 8:15 AM Medical Record Number: [id number] Patient Account Number: [id number] Date of Birth/Sex: 1955/12/11 (63 y.o. F) Treating RN: Curtis Sites Primary Care Provider: PATIENT, NO Other Clinician: Referring Provider: Sung Amabile Treating Provider/Extender: Linwood Dibbles, Prince Olivier Weeks in Treatment: 3 History of Present Illness HPI Description: 05/16/2019 patient presents today for initial evaluation in our clinic concerning a abscess she had in the right axillary region. This was subsequently evaluated by Dr. Tonna Boehringer in the hospital and he did have to open this up in order to drain the abscess. Subsequent to that it was recommended that the patient have this packed on a regular basis with saline wet-to- dry dressings. Unfortunately the patient does not have anyone who can really help her with this. Only her son is apparently in the picture and she states that he would "pass out" if he were to have to do this. Nonetheless she has been in a very tight spot she actually is gone back to the hospital to have this changed at one point and then subsequently they  recommended she go back to the surgeon Dr. Tonna Boehringer actually referred her to [place] to help manage the patient's wound. With that being said currently she states that she does not have any discomfort except for if the area is being packed with the packing is removed. She does have a history of hypertension but currently states that that is typically under pretty good control. With regard to the wound itself I do believe that this is something that is going to need to be packed on a more regular basis. This may and tell us having to do this some here in the clinic and then potentially getting her home health as well if were able to do so. I do think it is good to be of utmost importance however to keep this packing changed if at all possible daily although weekends are good to be difficult no matter what. No fevers, chills, nausea, vomiting, or diarrhea. 05/23/2019 upon evaluation today patient actually appears to be doing excellent in regard to her wound. She has been tolerating the dressing changes with the silver alginate without complication. Fortunately I am very pleased with the progress she is made this seems to be measuring better and in just 1 week's time is much cleaner she also tells me is not hurting like it was previous which is also excellent news. All in all I do not see anything that really has me concerned at this point which is great news as well. 05/30/2019 on evaluation today patient appears to be doing very well with regard to her ulcer. She has been tolerating the dressing changes without complication. This is measuring much better as far as the overall size is also measuring better as far as depth. Overall I feel like we are seeing good improvement. Potentially we could consider a snap VAC that may speed things up  even better for her. With that being said I am unsure whether her insurance would cover this. Nonetheless we can definitely continue with where things are now as far as  treatment is concerned. 06/06/2019 upon evaluation today patient appears to be doing well with regard to her wound in the right axillary region. She has been tolerating the dressing changes without complication. Fortunately there is no signs of active infection at this time. No fevers, chills, nausea, vomiting, or diarrhea. With that being said she does have something that we did notice to close and then at a visit she has a small what appears to be hair bump that is inferior to the wound. Nonetheless I do believe that she is going to require at this point potentially some antibiotics to try to help out in this regard. I do not want this to turn into anything more significant like what she had before. Electronic Signature(s) Signed: 06/06/2019 8:36:45 AM By: Lenda Kelp PA-C Entered By: Lenda Kelp on 06/06/2019 08:36:45 [name], [name] ([id number]) -------------------------------------------------------------------------------- Physical Exam Details Patient Name: [name] Date of Service: 06/06/2019 8:15 AM Medical Record Number: [id number] Patient Account Number: [id number] Date of Birth/Sex: 09-28-1955 (63 y.o. F) Treating RN: Curtis Sites Primary Care Provider: PATIENT, NO Other Clinician: Referring Provider: Sung Amabile Treating Provider/Extender: STONE III, Boruch Manuele Weeks in Treatment: 3 Constitutional Well-nourished and well-hydrated in no acute distress. Respiratory normal breathing without difficulty. Psychiatric this patient is able to make decisions and demonstrates good insight into disease process. Alert and Oriented x 3. pleasant and cooperative. Notes Upon inspection patient's wound again showed signs of good improvement she does have a small hair bump that was somewhat indurated just below the open area. Just to be on the [name] side we will go ahead and place her back on Bactrim for the next 10 days just to help treat this so that it does not turn it  anything more significant. Right now I do not see any signs of it worsening but nonetheless I want to be [name] Electronic Signature(s) Signed: 06/06/2019 8:37:34 AM By: Lenda Kelp PA-C Entered By: Lenda Kelp on 06/06/2019 08:37:34 [place], [name] ([id number]) -------------------------------------------------------------------------------- Physician Orders Details Patient Name: [name] Date of Service: 06/06/2019 8:15 AM Medical Record Number: [id number] Patient Account Number: [id number] Date of Birth/Sex: 30-Sep-1955 (63 y.o. F) Treating RN: Curtis Sites Primary Care Provider: PATIENT, NO Other Clinician: Referring Provider: Sung Amabile Treating Provider/Extender: Linwood Dibbles, Yohance Hathorne Weeks in Treatment: 3 Verbal / Phone Orders: No Diagnosis Coding ICD-10 Coding Code Description L02.411 Cutaneous abscess of right axilla L98.492 Non-pressure chronic ulcer of skin of other sites with fat layer exposed I10 Essential (primary) hypertension Wound Cleansing Wound #1 Right Axilla o Clean wound with Normal Saline. o May shower with protection. - Do not get your wound wet Primary Wound Dressing Wound #1 Right Axilla o Silver Alginate Secondary Dressing Wound #1 Right Axilla o Boardered Foam Dressing Dressing Change Frequency Wound #1 Right Axilla o Change dressing every day. - Mondays, Wednesdays and Fridays in clinic Follow-up Appointments o Return Appointment in 1 week. o Nurse Visit as needed Home Health Wound #1 Right Axilla o Continue Home Health Visits - [facility] o Home Health Nurse may visit PRN to address patientos wound care needs. o FACE TO FACE ENCOUNTER: MEDICARE and MEDICAID PATIENTS: I certify that this patient is under my care and that I had a face-to-face encounter that meets the physician face-to-face encounter requirements with this patient on this date. The  encounter with the patient was in whole or in part for the following  MEDICAL CONDITION: (primary reason for [place]) MEDICAL NECESSITY: I certify, that based on my findings, NURSING services are a medically necessary home health service. HOME BOUND STATUS: I certify that my clinical findings support that this patient is homebound (i.e., Due to illness or injury, pt requires aid of supportive devices such as crutches, cane, wheelchairs, walkers, the use of special transportation or the assistance of another person to leave their place of residence. There is a normal inability to leave the home and doing so requires considerable and taxing effort. Other absences are for medical reasons / religious services and are infrequent or of short duration when for other reasons). [place], [name] ([id number]) o If current dressing causes regression in wound condition, may D/C ordered dressing product/s and apply Normal Saline Moist Dressing daily until next [facility] / Other MD appointment. [place] of regression in wound condition at [phone]. o Please direct any NON-WOUND related issues/requests for orders to patient's Primary Care Physician Patient Medications Allergies: penicillin Notifications Medication Indication Start End Bactrim DS 06/06/2019 DOSE 1 - oral 800 mg-160 mg tablet - 1 tablet oral taken 2 times a day for 10 days Electronic Signature(s) Signed: 06/06/2019 8:40:12 AM By: Worthy Keeler PA-C Entered By: Worthy Keeler on 06/06/2019 08:40:11 [place], [name] ([id number]) -------------------------------------------------------------------------------- Problem List Details Patient Name: [name] Date of Service: 06/06/2019 8:15 AM Medical Record Number: [id number] Patient Account Number: [id number] Date of Birth/Sex: 1956/01/21 (63 y.o. F) Treating RN: Montey Hora Primary Care Provider: PATIENT, NO Other Clinician: Referring Provider: Benjamine Sprague Treating Provider/Extender: Melburn Hake,  Obryan Radu Weeks in Treatment: 3 Active Problems ICD-10 Evaluated Encounter Code Description Active Date Today Diagnosis L02.411 Cutaneous abscess of right axilla 05/16/2019 No Yes L98.492 Non-pressure chronic ulcer of skin of other sites with fat layer 05/16/2019 No Yes exposed [place] (primary) hypertension 05/16/2019 No Yes Inactive Problems Resolved Problems Electronic Signature(s) Signed: 06/06/2019 1:53:22 PM By: Worthy Keeler PA-C Entered By: Worthy Keeler on 06/06/2019 08:11:54 [place]arrez, [name] ([id number]) -------------------------------------------------------------------------------- Progress Note Details Patient Name: [name] Date of Service: 06/06/2019 8:15 AM Medical Record Number: [id number] Patient Account Number: [id number] Date of Birth/Sex: 1955-06-20 (63 y.o. F) Treating RN: Montey Hora Primary Care Provider: PATIENT, NO Other Clinician: Referring Provider: Benjamine Sprague Treating Provider/Extender: Melburn Hake, Daishon Chui Weeks in Treatment: 3 Subjective Chief Complaint Information obtained from Patient Right Axilla abscess and ulcer History of Present Illness (HPI) 05/16/2019 patient presents today for initial evaluation in our clinic concerning a abscess she had in the right axillary region. This was subsequently evaluated by Dr. Lysle Pearl in the hospital and he did have to open this up in order to drain the abscess. Subsequent to that it was recommended that the patient have this packed on a regular basis with saline wet-to-dry dressings. Unfortunately the patient does not have anyone who can really help her with this. Only her son is apparently in the picture and she states that he would "pass out" if he were to have to do this. Nonetheless she has been in a very tight spot she actually is gone back to the hospital to have this changed at one point and then subsequently they recommended she go back to the surgeon Dr. Lysle Pearl actually referred her to [place] to  help manage the patient's wound. With that being said currently she states that she does not have any discomfort except for if the  area is being packed with the packing is removed. She does have a history of hypertension but currently states that that is typically under pretty good control. With regard to the wound itself I do believe that this is something that is going to need to be packed on a more regular basis. This may and tell us having to do this some here in the clinic and then potentially getting her home health as well if were able to do so. I do think it is good to be of utmost importance however to keep this packing changed if at all possible daily although weekends are good to be difficult no matter what. No fevers, chills, nausea, vomiting, or diarrhea. 05/23/2019 upon evaluation today patient actually appears to be doing excellent in regard to her wound. She has been tolerating the dressing changes with the silver alginate without complication. Fortunately I am very pleased with the progress she is made this seems to be measuring better and in just 1 week's time is much cleaner she also tells me is not hurting like it was previous which is also excellent news. All in all I do not see anything that really has me concerned at this point which is great news as well. 05/30/2019 on evaluation today patient appears to be doing very well with regard to her ulcer. She has been tolerating the dressing changes without complication. This is measuring much better as far as the overall size is also measuring better as far as depth. Overall I feel like we are seeing good improvement. Potentially we could consider a snap VAC that may speed things up even better for her. With that being said I am unsure whether her insurance would cover this. Nonetheless we can definitely continue with where things are now as far as treatment is concerned. 06/06/2019 upon evaluation today patient appears to be doing  well with regard to her wound in the right axillary region. She has been tolerating the dressing changes without complication. Fortunately there is no signs of active infection at this time. No fevers, chills, nausea, vomiting, or diarrhea. With that being said she does have something that we did notice to close and then at a visit she has a small what appears to be hair bump that is inferior to the wound. Nonetheless I do believe that she is going to require at this point potentially some antibiotics to try to help out in this regard. I do not want this to turn into anything more significant like what she had before. Objective [place], [name] ([id number]) Constitutional Well-nourished and well-hydrated in no acute distress. Vitals Time Taken: 8:13 AM, Height: 64 in, Weight: 190 lbs, BMI: 32.6, Temperature: 98.5 F, Pulse: 66 bpm, Respiratory Rate: 16 breaths/min, Blood Pressure: 161/96 mmHg. Respiratory normal breathing without difficulty. Psychiatric this patient is able to make decisions and demonstrates good insight into disease process. Alert and Oriented x 3. pleasant and cooperative. General Notes: Upon inspection patient's wound again showed signs of good improvement she does have a small hair bump that was somewhat indurated just below the open area. Just to be on the [name] side we will go ahead and place her back on Bactrim for the next 10 days just to help treat this so that it does not turn it anything more significant. Right now I do not see any signs of it worsening but nonetheless I want to be [name] Integumentary (Hair, Skin) Wound #1 status is Open. Original cause of wound was Gradually Appeared.  The wound is located on the Right Axilla. The wound measures 0.1cm length x 2cm width x 1cm depth; 0.157cm^2 area and 0.157cm^3 volume. There is Fat Layer (Subcutaneous Tissue) Exposed exposed. There is no tunneling or undermining noted. There is a medium amount of serosanguineous  drainage noted. There is large (67-100%) red granulation within the wound bed. There is no necrotic tissue within the wound bed. Assessment Active Problems ICD-10 Cutaneous abscess of right axilla Non-pressure chronic ulcer of skin of other sites with fat layer exposed Essential (primary) hypertension Plan Wound Cleansing: Wound #1 Right Axilla: Clean wound with Normal Saline. May shower with protection. - Do not get your wound wet Primary Wound Dressing: Wound #1 Right Axilla: Silver Alginate Secondary Dressing: Wound #1 Right Axilla: Boardered Foam Dressing Frisina, [name] ([id number]) Dressing Change Frequency: Wound #1 Right Axilla: Change dressing every day. - Mondays, Wednesdays and Fridays in clinic Follow-up Appointments: Return Appointment in 1 week. Nurse Visit as needed Home Health: Wound #1 Right Axilla: Continue [facility] Health Visits - [facility] Nurse may visit PRN to address patient s wound care needs. FACE TO FACE ENCOUNTER: MEDICARE and MEDICAID PATIENTS: I certify that this patient is under my care and that I had a face-to-face encounter that meets the physician face-to-face encounter requirements with this patient on this date. The encounter with the patient was in whole or in part for the following MEDICAL CONDITION: (primary reason for Home Healthcare) MEDICAL NECESSITY: I certify, that based on my findings, NURSING services are a medically necessary home health service. HOME BOUND STATUS: I certify that my clinical findings support that this patient is homebound (i.e., Due to illness or injury, pt requires aid of supportive devices such as crutches, cane, wheelchairs, walkers, the use of special transportation or the assistance of another person to leave their place of residence. There is a normal inability to leave the home and doing so requires considerable and taxing effort. Other absences are for medical reasons / religious services and are  infrequent or of short duration when for other reasons). If current dressing causes regression in wound condition, may D/C ordered dressing product/s and apply Normal Saline Moist Dressing daily until next [facility] / Other MD appointment. Notify [facility] of regression in wound condition at [phone]. Please direct any NON-WOUND related issues/requests for orders to patient's Primary Care Physician The following medication(s) was prescribed: Bactrim DS oral 800 mg-160 mg tablet 1 1 tablet oral taken 2 times a day for 10 days starting 06/06/2019 1. My suggestion currently is good to be that we go ahead and initiate Bactrim for the patient in order to prevent the current hair bump inferior to the wound currently from turning anything more severe. 2. I am also going to recommend currently that there are definite improvements in the wound I think we will continue with the alginate which hopefully will be the appropriate thing to get this closed. So far she is made excellent progress. 3. She is not having any pain which is also good news and we will see how things continue to progress over the next week. We will see patient back for reevaluation in 1 week here in the clinic. If anything worsens or changes patient will contact our office for additional recommendations. Electronic Signature(s) Signed: 06/06/2019 8:40:50 AM By: Lenda Kelp PA-C Previous Signature: 06/06/2019 8:39:13 AM Version By: Lenda Kelp PA-C Entered By: Lenda Kelp on 06/06/2019 08:40:50 [place], [name] ([id number]) -------------------------------------------------------------------------------- SuperBill Details Patient  Name: XEE, HOLLMAN Date of Service: 06/06/2019 Medical Record Number: [id number] Patient Account Number: [id number] Date of Birth/Sex: [birth year] (64 y.o. F) Treating RN: Curtis Sites Primary Care Provider: PATIENT, NO Other Clinician: Referring Provider: Sung Amabile Treating Provider/Extender: Linwood Dibbles, Bradan Congrove Weeks in Treatment: 3 Diagnosis Coding ICD-10 Codes Code Description L02.411 Cutaneous abscess of right axilla L98.492 Non-pressure chronic ulcer of skin of other sites with fat layer exposed I10 Essential (primary) hypertension Facility Procedures CPT4 Code: [id number] Description: 99213 - WOUND CARE VISIT-LEV 3 EST PT Modifier: Quantity: 1 Physician Procedures CPT4 Code: 7199412 Description: 99214 - WC PHYS LEVEL 4 - EST PT ICD-10 Diagnosis Description L02.411 Cutaneous abscess of right axilla L98.492 Non-pressure chronic ulcer of skin of other sites with fat l I10 Essential (primary) hypertension Modifier: ayer exposed Quantity: 1 Electronic Signature(s) Signed: 06/06/2019 8:41:03 AM By: Lenda Kelp PA-C Entered By: Lenda Kelp on 06/06/2019 08:41:03

## 2019-06-06 NOTE — Progress Notes (Signed)
[name], [name] ([id number]) Visit Report for 06/06/2019 Arrival Information Details Patient Name: [name], [name] Date of Service: 06/06/2019 8:15 AM Medical Record Number: [id number] Patient Account Number: [id number] Date of Birth/Sex: 09-Dec-1955 (64 y.o. F) Treating RN: Montey [name] Primary Care [name]: PATIENT, NO Other Clinician: Referring [name]: [name] Treating [name]/Extender: [name], [name] Weeks in Treatment: 3 Visit Information History Since Last Visit Added or deleted any medications: No Patient Arrived: Ambulatory Any new allergies or adverse reactions: No Arrival Time: 08:12 Had a fall or experienced change in No Accompanied By: self activities of daily living that may affect Transfer Assistance: None risk of falls: Patient Identification Verified: Yes Signs or symptoms of abuse/neglect since last visito No Secondary Verification Process Completed: Yes Hospitalized since last visit: No Implantable device outside of the clinic excluding No cellular tissue based products placed in the center since last visit: Has Dressing in Place as Prescribed: Yes Pain Present Now: No Electronic Signature(s) Signed: 06/06/2019 2:17:28 PM By: [name] RCP, RRT, CHT Entered By: [name] on 06/06/2019 08:13:15 [name] ([id number]) -------------------------------------------------------------------------------- Clinic Level of Care Assessment Details Patient Name: [name] Date of Service: 06/06/2019 8:15 AM Medical Record Number: [id number] Patient Account Number: [id number] Date of Birth/Sex: 10/05/1955 (64 y.o. F) Treating RN: Montey [name] Primary Care Mylez Venable: PATIENT, NO Other Clinician: Referring Indigo Barbian: [name] Treating [name]/Extender: [name], [name] Weeks in Treatment: 3 Clinic Level of Care Assessment Items TOOL 4 Quantity Score '[]'  - Use when only an EandM is performed on FOLLOW-UP  visit 0 ASSESSMENTS - Nursing Assessment / Reassessment X - Reassessment of Co-morbidities (includes updates in patient status) 1 10 X- 1 5 Reassessment of Adherence to Treatment Plan ASSESSMENTS - Wound and Skin Assessment / Reassessment X - Simple Wound Assessment / Reassessment - one wound 1 5 '[]'  - 0 Complex Wound Assessment / Reassessment - multiple wounds X- 1 10 Dermatologic / Skin Assessment (not related to wound area) ASSESSMENTS - Focused Assessment '[]'  - Circumferential Edema Measurements - multi extremities 0 '[]'  - 0 Nutritional Assessment / Counseling / Intervention '[]'  - 0 Lower Extremity Assessment (monofilament, tuning fork, pulses) '[]'  - 0 Peripheral Arterial Disease Assessment (using hand held doppler) ASSESSMENTS - Ostomy and/or Continence Assessment and Care '[]'  - Incontinence Assessment and Management 0 '[]'  - 0 Ostomy Care Assessment and Management (repouching, etc.) PROCESS - Coordination of Care X - Simple Patient / Family Education for ongoing care 1 15 '[]'  - 0 Complex (extensive) Patient / Family Education for ongoing care X- 1 10 Staff obtains Programmer, systems, Records, Test Results / Process Orders '[]'  - 0 Staff telephones HHA, Nursing Homes / Clarify orders / etc '[]'  - 0 Routine Transfer to another Facility (non-emergent condition) '[]'  - 0 Routine Hospital Admission (non-emergent condition) '[]'  - 0 New Admissions / Biomedical engineer / Ordering NPWT, Apligraf, etc. '[]'  - 0 Emergency Hospital Admission (emergent condition) X- 1 10 Simple Discharge Coordination [name] ([id number]) '[]'  - 0 Complex (extensive) Discharge Coordination PROCESS - Special Needs '[]'  - Pediatric / Minor Patient Management 0 '[]'  - 0 Isolation Patient Management '[]'  - 0 Hearing / Language / Visual special needs '[]'  - 0 Assessment of Community assistance (transportation, D/C planning, etc.) '[]'  - 0 Additional assistance / Altered mentation '[]'  - 0 Support Surface(s)  Assessment (bed, cushion, seat, etc.) INTERVENTIONS - Wound Cleansing / Measurement X - Simple Wound Cleansing - one wound 1 5 '[]'  - 0 Complex Wound Cleansing - multiple wounds X- 1 5 Wound Imaging (  photographs - any number of wounds) '[]'  - 0 Wound Tracing (instead of photographs) X- 1 5 Simple Wound Measurement - one wound '[]'  - 0 Complex Wound Measurement - multiple wounds INTERVENTIONS - Wound Dressings X - Small Wound Dressing one or multiple wounds 1 10 '[]'  - 0 Medium Wound Dressing one or multiple wounds '[]'  - 0 Large Wound Dressing one or multiple wounds '[]'  - 0 Application of Medications - topical '[]'  - 0 Application of Medications - injection INTERVENTIONS - Miscellaneous '[]'  - External ear exam 0 '[]'  - 0 Specimen Collection (cultures, biopsies, blood, body fluids, etc.) '[]'  - 0 Specimen(s) / Culture(s) sent or taken to Lab for analysis '[]'  - 0 Patient Transfer (multiple staff / [employer] Service fast streamer / Similar devices) '[]'  - 0 Simple Staple / Suture removal (25 or less) '[]'  - 0 Complex Staple / Suture removal (26 or more) '[]'  - 0 Hypo / Hyperglycemic Management (close monitor of Blood Glucose) '[]'  - 0 Ankle / Brachial Index (ABI) - do not check if billed separately X- 1 5 Vital Signs [name] ([id number]) Has the patient been seen at the hospital within the last three years: Yes Total Score: 95 Level Of Care: New/Established - Level 3 Electronic Signature(s) Signed: 06/06/2019 1:41:09 PM By: Montey [name] Entered By: Montey [name] on 06/06/2019 08:40:01 [name] ([id number]) -------------------------------------------------------------------------------- Encounter Discharge Information Details Patient Name: [name] Date of Service: 06/06/2019 8:15 AM Medical Record Number: [id number] Patient Account Number: [id number] Date of Birth/Sex: 1956-03-22 (63 y.o. F) Treating RN: Montey [name] Primary Care [name]: PATIENT, NO Other Clinician: Referring  [name]: [name] Treating [name]/Extender: [name], [name] Weeks in Treatment: 3 Encounter Discharge Information Items Discharge Condition: Stable Ambulatory Status: Ambulatory Discharge Destination: Home Transportation: Private Auto Accompanied By: self Schedule Follow-up Appointment: Yes Clinical Summary of Care: Electronic Signature(s) Signed: 06/06/2019 8:41:07 AM By: Montey [name] Entered By: Montey [name] on 06/06/2019 08:41:07 Elena, [name] ([id number]) -------------------------------------------------------------------------------- Lower Extremity Assessment Details Patient Name: [name] Date of Service: 06/06/2019 8:15 AM Medical Record Number: [id number] Patient Account Number: [id number] Date of Birth/Sex: August 17, 1955 (63 y.o. F) Treating RN: Army Melia Primary Care Lilyrose Tanney: PATIENT, NO Other Clinician: Referring Hasheem Voland: [name] Treating Tennie Grussing/Extender: [name], [name] Weeks in Treatment: 3 Electronic Signature(s) Signed: 06/06/2019 12:53:43 PM By: Army Melia Entered By: Army Melia on 06/06/2019 08:17:25 Hoying, Memorie ([id number]) -------------------------------------------------------------------------------- Multi Wound Chart Details Patient Name: [name] Date of Service: 06/06/2019 8:15 AM Medical Record Number: [id number] Patient Account Number: [id number] Date of Birth/Sex: 05-19-1955 (63 y.o. F) Treating RN: Montey [name] Primary Care Sreeja Spies: PATIENT, NO Other Clinician: Referring Adriene Padula: [name] Treating Ulrick Methot/Extender: STONE III, [name] Weeks in Treatment: 3 Vital Signs Height(in): 64 Pulse(bpm): 66 Weight(lbs): 190 Blood Pressure(mmHg): 161/96 Body Mass Index(BMI): 33 Temperature(F): 98.5 Respiratory Rate 16 (breaths/min): Photos: [N/A:N/A] Wound Location: Right Axilla N/A N/A Wounding Event: Gradually Appeared N/A N/A Primary Etiology: Abscess N/A N/A Comorbid History: Cataracts,  Chronic Obstructive N/A N/A Pulmonary Disease (COPD), Hypertension, Myocardial Infarction Date Acquired: 05/02/2019 N/A N/A Weeks of Treatment: 3 N/A N/A Wound Status: Open N/A N/A Measurements L x W x D 0.1x2x1 N/A N/A (cm) Area (cm) : 0.157 N/A N/A Volume (cm) : 0.157 N/A N/A % Reduction in Area: 87.50% N/A N/A % Reduction in Volume: 95.50% N/A N/A Classification: Full Thickness Without N/A N/A Exposed Support Structures Exudate Amount: Medium N/A N/A Exudate Type: Serosanguineous N/A N/A Exudate Color: red, brown N/A N/A Granulation Amount: Large (67-100%) N/A N/A Granulation Quality: Red N/A N/A Necrotic  Amount: None Present (0%) N/A N/A Exposed Structures: Fat Layer (Subcutaneous N/A N/A Tissue) Exposed: Yes Fascia: No Tendon: No Muscle: No Axelson, Monta ([id number]) Joint: No Bone: No Epithelialization: Medium (34-66%) N/A N/A Treatment Notes Electronic Signature(s) Signed: 06/06/2019 1:41:09 PM By: Montey [name] Entered By: Montey [name] on 06/06/2019 08:30:15 Gillott, [name] ([id number]) -------------------------------------------------------------------------------- Multi-Disciplinary Care Plan Details Patient Name: [name] Date of Service: 06/06/2019 8:15 AM Medical Record Number: [id number] Patient Account Number: [id number] Date of Birth/Sex: 05-Aug-1955 (63 y.o. F) Treating RN: Montey [name] Primary Care Cashis Rill: PATIENT, NO Other Clinician: Referring Michol Emory: [name] Treating Oceanna Arruda/Extender: [name], [name] Weeks in Treatment: 3 Active Inactive Abuse / Safety / Falls / Self Care Management Nursing Diagnoses: Potential for falls Goals: Patient will remain injury free related to falls Date Initiated: 05/16/2019 Target Resolution Date: 08/09/2019 Goal Status: Active Interventions: Assess fall risk on admission and as needed Notes: Orientation to the Wound Care Program Nursing Diagnoses: Knowledge deficit related to the  wound healing center program Goals: Patient/caregiver will verbalize understanding of the [facility] Date Initiated: 05/16/2019 Target Resolution Date: 08/09/2019 Goal Status: Active Interventions: Provide education on orientation to the wound center Notes: Pain, Acute or Chronic Nursing Diagnoses: Pain, acute or chronic: actual or potential Goals: Patient/caregiver will verbalize comfort level met Date Initiated: 05/16/2019 Target Resolution Date: 08/09/2019 Goal Status: Active Interventions: Complete pain assessment as per visit requirements Neale, Frayda ([id number]) Notes: Soft Tissue Infection Nursing Diagnoses: Impaired tissue integrity Goals: Patient will remain free of wound infection Date Initiated: 05/16/2019 Target Resolution Date: 08/09/2019 Goal Status: Active Interventions: Assess signs and symptoms of infection every visit Notes: Wound/Skin Impairment Nursing Diagnoses: Impaired tissue integrity Goals: Ulcer/skin breakdown will heal within 14 weeks Date Initiated: 05/16/2019 Target Resolution Date: 08/09/2019 Goal Status: Active Interventions: Assess patient/caregiver ability to obtain necessary supplies Assess patient/caregiver ability to perform ulcer/skin care regimen upon admission and as needed Assess ulceration(s) every visit Notes: Electronic Signature(s) Signed: 06/06/2019 1:41:09 PM By: Montey [name] Entered By: Montey [name] on 06/06/2019 08:30:09 Vallandingham, [name] ([id number]) -------------------------------------------------------------------------------- Pain Assessment Details Patient Name: [name] Date of Service: 06/06/2019 8:15 AM Medical Record Number: [id number] Patient Account Number: [id number] Date of Birth/Sex: 1955-05-12 (63 y.o. F) Treating RN: Montey [name] Primary Care Krystine Pabst: PATIENT, NO Other Clinician: Referring Krishna Dancel: [name] Treating Nekhi Liwanag/Extender: [name], [name] Weeks in  Treatment: 3 Active Problems Location of Pain Severity and Description of Pain Patient Has Paino No Site Locations Pain Management and Medication Current Pain Management: Electronic Signature(s) Signed: 06/06/2019 1:41:09 PM By: Montey [name] Signed: 06/06/2019 2:17:28 PM By: [name] RCP, RRT, CHT Entered By: [name] on 06/06/2019 08:13:42 Daloia, [name] ([id number]) -------------------------------------------------------------------------------- Patient/Caregiver Education Details Patient Name: [name] Date of Service: 06/06/2019 8:15 AM Medical Record Number: [id number] Patient Account Number: [id number] Date of Birth/Gender: 09/22/1955 (63 y.o. F) Treating RN: Montey [name] Primary Care Physician: PATIENT, NO Other Clinician: Referring Physician: Benjamine [name] Treating Physician/Extender: Sharalyn Ink in Treatment: 3 Education Assessment Education Provided To: Patient Education Topics Provided Wound/Skin Impairment: Handouts: Other: antibiotic added today Methods: Explain/Verbal Responses: State content correctly Electronic Signature(s) Signed: 06/06/2019 1:41:09 PM By: Montey [name] Entered By: Montey [name] on 06/06/2019 08:40:35 Kuzel, [name] ([id number]) -------------------------------------------------------------------------------- Wound Assessment Details Patient Name: [name] Date of Service: 06/06/2019 8:15 AM Medical Record Number: [id number] Patient Account Number: [id number] Date of Birth/Sex: 19-Mar-1956 (63 y.o. F) Treating RN: Army Melia Primary Care Timofey Carandang: PATIENT, NO Other Clinician: Referring Kerin Kren: [name] Treating Garvin Ellena/Extender: Joaquim Lai  III, [name] Weeks in Treatment: 3 Wound Status Wound Number: 1 Primary Abscess Etiology: Wound Location: Right Axilla Wound Open Wounding Event: Gradually Appeared Status: Date Acquired: 05/02/2019 Comorbid Cataracts, Chronic  Obstructive Pulmonary Weeks Of Treatment: 3 History: Disease (COPD), Hypertension, Myocardial Clustered Wound: No Infarction Photos Wound Measurements Length: (cm) 0.1 Width: (cm) 2 Depth: (cm) 1 Area: (cm) 0.157 Volume: (cm) 0.157 % Reduction in Area: 87.5% % Reduction in Volume: 95.5% Epithelialization: Medium (34-66%) Tunneling: No Undermining: No Wound Description Full Thickness Without Exposed Support Foul Classification: Structures Sloug Exudate Medium Amount: Exudate Type: Serosanguineous Exudate Color: red, brown Odor After Cleansing: No h/Fibrino No Wound Bed Granulation Amount: Large (67-100%) Exposed Structure Granulation Quality: Red Fascia Exposed: No Necrotic Amount: None Present (0%) Fat Layer (Subcutaneous Tissue) Exposed: Yes Tendon Exposed: No Muscle Exposed: No Joint Exposed: No Bone Exposed: No Gasparro, Delorise ([id number]) Treatment Notes Wound #1 (Right Axilla) Notes silvercel, BFD Electronic Signature(s) Signed: 06/06/2019 12:53:43 PM By: Army Melia Entered By: Army Melia on 06/06/2019 08:17:08 Consiglio, [name] ([id number]) -------------------------------------------------------------------------------- Vitals Details Patient Name: [name] Date of Service: 06/06/2019 8:15 AM Medical Record Number: [id number] Patient Account Number: [id number] Date of Birth/Sex: [birth year] (64 y.o. F) Treating RN: Montey [name] Primary Care Dossie Ocanas: PATIENT, NO Other Clinician: Referring Jil Penland: [name] Treating Yomira Flitton/Extender: STONE III, [name] Weeks in Treatment: 3 Vital Signs Time Taken: 08:13 Temperature (F): 98.5 Height (in): 64 Pulse (bpm): 66 Weight (lbs): 190 Respiratory Rate (breaths/min): 16 Body Mass Index (BMI): 32.6 Blood Pressure (mmHg): 161/96 Reference Range: 80 - 120 mg / dl Electronic Signature(s) Signed: 06/06/2019 2:17:28 PM By: [name] RCP, RRT, CHT Entered By: [name] on 06/06/2019 08:15:13

## 2019-06-09 ENCOUNTER — Other Ambulatory Visit: Payer: Self-pay

## 2019-06-09 DIAGNOSIS — L02411 Cutaneous abscess of right axilla: Secondary | ICD-10-CM | POA: Diagnosis not present

## 2019-06-09 NOTE — Progress Notes (Signed)
MALARIE, TAPPEN ([id number]) Visit Report for 06/09/2019 Arrival Information Details Patient Name: [name], [name] Date of Service: 06/09/2019 8:00 AM Medical Record Number: [id number] Patient Account Number: [id number] Date of Birth/Sex: 1955/09/05 (64 y.o. F) Treating RN: Army Melia Primary Care Tylan Kinn: PATIENT, NO Other Clinician: Referring Retina Bernardy: Benjamine Sprague Treating Dontario Evetts/Extender: Melburn Hake, HOYT Weeks in Treatment: 3 Visit Information History Since Last Visit Added or deleted any medications: No Patient Arrived: Ambulatory Any new allergies or adverse reactions: No Arrival Time: 08:06 Had a fall or experienced change in No Accompanied By: self activities of daily living that may affect Transfer Assistance: None risk of falls: Patient Identification Verified: Yes Signs or symptoms of abuse/neglect since last visito No Hospitalized since last visit: No Has Dressing in Place as Prescribed: Yes Pain Present Now: No Electronic Signature(s) Signed: 06/09/2019 10:48:43 AM By: Army Melia Entered By: Army Melia on 06/09/2019 08:06:16 Emerson, [name] ([id number]) -------------------------------------------------------------------------------- Clinic Level of Care Assessment Details Patient Name: [name] Date of Service: 06/09/2019 8:00 AM Medical Record Number: [id number] Patient Account Number: [id number] Date of Birth/Sex: Oct 17, 1955 (64 y.o. F) Treating RN: Army Melia Primary Care Tytus Strahle: PATIENT, NO Other Clinician: Referring Tamee Battin: Benjamine Sprague Treating Jancie Kercher/Extender: Melburn Hake, HOYT Weeks in Treatment: 3 Clinic Level of Care Assessment Items TOOL 4 Quantity Score []  - Use when only an EandM is performed on FOLLOW-UP visit 0 ASSESSMENTS - Nursing Assessment / Reassessment X - Reassessment of Co-morbidities (includes updates in patient status) 1 10 X- 1 5 Reassessment of Adherence to Treatment Plan ASSESSMENTS - Wound and Skin  Assessment / Reassessment X - Simple Wound Assessment / Reassessment - one wound 1 5 []  - 0 Complex Wound Assessment / Reassessment - multiple wounds []  - 0 Dermatologic / Skin Assessment (not related to wound area) ASSESSMENTS - Focused Assessment []  - Circumferential Edema Measurements - multi extremities 0 []  - 0 Nutritional Assessment / Counseling / Intervention []  - 0 Lower Extremity Assessment (monofilament, tuning fork, pulses) []  - 0 Peripheral Arterial Disease Assessment (using hand held doppler) ASSESSMENTS - Ostomy and/or Continence Assessment and Care []  - Incontinence Assessment and Management 0 []  - 0 Ostomy Care Assessment and Management (repouching, etc.) PROCESS - Coordination of Care X - Simple Patient / Family Education for ongoing care 1 15 []  - 0 Complex (extensive) Patient / Family Education for ongoing care []  - 0 Staff obtains Programmer, systems, Records, Test Results / Process Orders X- 1 10 Staff telephones HHA, Nursing Homes / Clarify orders / etc []  - 0 Routine Transfer to another Facility (non-emergent condition) []  - 0 Routine Hospital Admission (non-emergent condition) []  - 0 New Admissions / Biomedical engineer / Ordering NPWT, Apligraf, etc. []  - 0 Emergency Hospital Admission (emergent condition) X- 1 10 Simple Discharge Coordination Brunn, Dovie ([id number]) []  - 0 Complex (extensive) Discharge Coordination PROCESS - Special Needs []  - Pediatric / Minor Patient Management 0 []  - 0 Isolation Patient Management []  - 0 Hearing / Language / Visual special needs []  - 0 Assessment of Community assistance (transportation, D/C planning, etc.) []  - 0 Additional assistance / Altered mentation []  - 0 Support Surface(s) Assessment (bed, cushion, seat, etc.) INTERVENTIONS - Wound Cleansing / Measurement X - Simple Wound Cleansing - one wound 1 5 []  - 0 Complex Wound Cleansing - multiple wounds X- 1 5 Wound Imaging (photographs - any  number of wounds) []  - 0 Wound Tracing (instead of photographs) X- 1 5 Simple Wound Measurement - one wound []  - 0 Complex Wound Measurement -  multiple wounds INTERVENTIONS - Wound Dressings []  - Small Wound Dressing one or multiple wounds 0 X- 1 15 Medium Wound Dressing one or multiple wounds []  - 0 Large Wound Dressing one or multiple wounds []  - 0 Application of Medications - topical []  - 0 Application of Medications - injection INTERVENTIONS - Miscellaneous []  - External ear exam 0 []  - 0 Specimen Collection (cultures, biopsies, blood, body fluids, etc.) []  - 0 Specimen(s) / Culture(s) sent or taken to Lab for analysis []  - 0 Patient Transfer (multiple staff / Lift / Similar devices) []  - 0 Simple Staple / Suture removal (25 or less) []  - 0 Complex Staple / Suture removal (26 or more) []  - 0 Hypo / Hyperglycemic Management (close monitor of Blood Glucose) []  - 0 Ankle / Brachial Index (ABI) - do not check if billed separately []  - 0 Vital Signs Goin, Cathey ( ) Has the patient been seen at the hospital within the last three years: Yes Total Score: 85 Level Of Care: New/Established - Level 3 Electronic Signature(s) Signed: 06/09/2019 10:48:43 AM By: Entered By: on 06/09/2019 08:10:48 Lugar, ( ) -------------------------------------------------------------------------------- Encounter Discharge Information Details Patient Name: [name] Date of Service: 06/09/2019 8:00 AM Medical Record Number: Patient Account Number: Date of Birth/Sex: Sep 18, 1955 (63 y.o. F) Treating RN: [id number] Primary Care Holdan Stucke: PATIENT, NO Other Clinician: Referring Jalil Lorusso: 08/07/2019 Treating Tanvi Gatling/Extender: Rodell Perna, HOYT Weeks in Treatment: 3 Encounter Discharge Information Items Discharge Condition: Stable Ambulatory Status: Ambulatory Discharge Destination:  Home Transportation: Private Auto Accompanied By: self Schedule Follow-up Appointment: Yes Clinical Summary of Care: Electronic Signature(s) Signed: 06/09/2019 10:48:43 AM By: 08/07/2019 Entered By: [name] on 06/09/2019 08:10:10 Barra, [name] (08/07/2019) -------------------------------------------------------------------------------- Wound Assessment Details Patient Name: [id number] Date of Service: 06/09/2019 8:00 AM Medical Record Number: 14/07/1955 Patient Account Number: [id number] Date of Birth/Sex: Jan 15, 1956 (63 y.o. F) Treating RN: Linwood Dibbles Primary Care Daviana Haymaker: PATIENT, NO Other Clinician: Referring Bernard Slayden: 08/07/2019 Treating Lemmie Steinhaus/Extender: STONE III, HOYT Weeks in Treatment: 3 Wound Status Wound Number: 1 Primary Etiology: Abscess Wound Location: Right Axilla Wound Status: Open Wounding Event: Gradually Appeared Date Acquired: 05/02/2019 Weeks Of Treatment: 3 Clustered Wound: No Wound Measurements Length: (cm) 0.1 Width: (cm) 1.8 Depth: (cm) 0.8 Area: (cm) 0.141 Volume: (cm) 0.113 % Reduction in Area: 88.8% % Reduction in Volume: 96.8% Wound Description Full Thickness Without Exposed Support Classification: Structures Treatment Notes Wound #1 (Right Axilla) Notes silvercel, BFD Electronic Signature(s) Signed: 06/09/2019 10:48:43 AM By: 08/07/2019 Entered By: [name] on 06/09/2019 08:09:46

## 2019-06-11 ENCOUNTER — Other Ambulatory Visit: Payer: Self-pay

## 2019-06-11 DIAGNOSIS — L02411 Cutaneous abscess of right axilla: Secondary | ICD-10-CM | POA: Diagnosis not present

## 2019-06-11 NOTE — Progress Notes (Signed)
LEIYAH, MAULTSBY ([id number]) Visit Report for 06/11/2019 Arrival Information Details Patient Name: SAMHITHA, ROSEN Date of Service: 06/11/2019 8:15 AM Medical Record Number: [id number] Patient Account Number: [id number] Date of Birth/Sex: 1955-11-05 (64 y.o. F) Treating RN: Curtis Sites Primary Care Dereonna Lensing: PATIENT, NO Other Clinician: Referring Yulonda [place]eling: Sung Amabile Treating Astha Probasco/Extender: Altamese [place] in Treatment: 3 Visit Information History Since Last Visit Added or deleted any medications: No Patient Arrived: Ambulatory Any new allergies or adverse reactions: No Arrival Time: 08:24 Had a fall or experienced change in No Accompanied By: self activities of daily living that may affect Transfer Assistance: None risk of falls: Patient Identification Verified: Yes Signs or symptoms of abuse/neglect since last visito No Secondary Verification Process Completed: Yes Hospitalized since last visit: No Implantable device outside of the clinic excluding No cellular tissue based products placed in the center since last visit: Has Dressing in Place as Prescribed: Yes Pain Present Now: No Notes 98.5 Electronic Signature(s) Signed: 06/11/2019 4:52:40 PM By: Curtis Sites Entered By: Curtis Sites on 06/11/2019 08:25:01 Mazzaferro, [name] ([id number]) -------------------------------------------------------------------------------- Clinic Level of Care Assessment Details Patient Name: [name] Date of Service: 06/11/2019 8:15 AM Medical Record Number: [id number] Patient Account Number: [id number] Date of Birth/Sex: [birth year] (64 y.o. F) Treating RN: Curtis Sites Primary Care Aydn Ferrara: PATIENT, NO Other Clinician: Referring Kyrie Fludd: Sung Amabile Treating Marry Kusch/Extender: Altamese [place] in Treatment: 3 Clinic Level of Care Assessment Items TOOL 4 Quantity Score []  - Use when only an EandM is performed on FOLLOW-UP visit 0 ASSESSMENTS  - Nursing Assessment / Reassessment X - Reassessment of Co-morbidities (includes updates in patient status) 1 10 X- 1 5 Reassessment of Adherence to Treatment Plan ASSESSMENTS - Wound and Skin Assessment / Reassessment X - Simple Wound Assessment / Reassessment - one wound 1 5 []  - 0 Complex Wound Assessment / Reassessment - multiple wounds []  - 0 Dermatologic / Skin Assessment (not related to wound area) ASSESSMENTS - Focused Assessment []  - Circumferential Edema Measurements - multi extremities 0 []  - 0 Nutritional Assessment / Counseling / Intervention []  - 0 Lower Extremity Assessment (monofilament, tuning fork, pulses) []  - 0 Peripheral Arterial Disease Assessment (using hand held doppler) ASSESSMENTS - Ostomy and/or Continence Assessment and Care []  - Incontinence Assessment and Management 0 []  - 0 Ostomy Care Assessment and Management (repouching, etc.) PROCESS - Coordination of Care X - Simple Patient / Family Education for ongoing care 1 15 []  - 0 Complex (extensive) Patient / Family Education for ongoing care []  - 0 Staff obtains , Records, Test Results / Process Orders []  - 0 Staff telephones HHA, Nursing Homes / Clarify orders / etc []  - 0 Routine Transfer to another Facility (non-emergent condition) []  - 0 Routine Hospital Admission (non-emergent condition) []  - 0 New Admissions / / Ordering NPWT, Apligraf, etc. []  - 0 Emergency Hospital Admission (emergent condition) X- 1 10 Simple Discharge Coordination Elsey, Hessie ( ) []  - 0 Complex (extensive) Discharge Coordination PROCESS - Special Needs []  - Pediatric / Minor Patient Management 0 []  - 0 Isolation Patient Management []  - 0 Hearing / Language / Visual special needs []  - 0 Assessment of Community assistance (transportation, D/C planning, etc.) []  - 0 Additional assistance / Altered mentation []  - 0 Support Surface(s) Assessment (bed, cushion, seat,  etc.) INTERVENTIONS - Wound Cleansing / Measurement X - Simple Wound Cleansing - one wound 1 5 []  - 0 Complex Wound Cleansing - multiple wounds X- 1 5 Wound Imaging (photographs - any  number of wounds) []  - 0 Wound Tracing (instead of photographs) X- 1 5 Simple Wound Measurement - one wound []  - 0 Complex Wound Measurement - multiple wounds INTERVENTIONS - Wound Dressings X - Small Wound Dressing one or multiple wounds 1 10 []  - 0 Medium Wound Dressing one or multiple wounds []  - 0 Large Wound Dressing one or multiple wounds []  - 0 Application of Medications - topical []  - 0 Application of Medications - injection INTERVENTIONS - Miscellaneous []  - External ear exam 0 []  - 0 Specimen Collection (cultures, biopsies, blood, body fluids, etc.) []  - 0 Specimen(s) / Culture(s) sent or taken to Lab for analysis []  - 0 Patient Transfer (multiple staff / Civil Service fast streamer / Similar devices) []  - 0 Simple Staple / Suture removal (25 or less) []  - 0 Complex Staple / Suture removal (26 or more) []  - 0 Hypo / Hyperglycemic Management (close monitor of Blood Glucose) []  - 0 Ankle / Brachial Index (ABI) - do not check if billed separately []  - 0 Vital Signs Rago, Libby ([id number]) Has the patient been seen at the hospital within the last three years: Yes Total Score: 70 Level Of Care: New/Established - Level 2 Electronic Signature(s) Signed: 06/11/2019 4:52:40 PM By: Montey Hora Entered By: Montey Hora on 06/11/2019 08:27:50 Gundrum, [name] ([id number]) -------------------------------------------------------------------------------- Encounter Discharge Information Details Patient Name: [name] Date of Service: 06/11/2019 8:15 AM Medical Record Number: [id number] Patient Account Number: [id number] Date of Birth/Sex: 10/08/1955 (63 y.o. F) Treating RN: Montey Hora Primary Care Shaila Gilchrest: PATIENT, NO Other Clinician: Referring Thijs Brunton: Benjamine Sprague Treating Rusti Arizmendi/Extender: Tito Dine in Treatment: 3 Encounter Discharge Information Items Discharge Condition: Stable Ambulatory Status: Ambulatory Discharge Destination: Home Transportation: Private Auto Accompanied By: self Schedule Follow-up Appointment: No Clinical Summary of Care: Electronic Signature(s) Signed: 06/11/2019 4:52:40 PM By: Montey Hora Entered By: Montey Hora on 06/11/2019 08:27:32 Godden, Oral ([id number]) -------------------------------------------------------------------------------- Wound Assessment Details Patient Name: [name] Date of Service: 06/11/2019 8:15 AM Medical Record Number: [id number] Patient Account Number: [id number] Date of Birth/Sex: May 30, 1955 (63 y.o. F) Treating RN: Montey Hora Primary Care Juniper Cobey: PATIENT, NO Other Clinician: Referring Jahnaya Branscome: Benjamine Sprague Treating Broady Lafoy/Extender: Tito Dine in Treatment: 3 Wound Status Wound Number: 1 Primary Abscess Etiology: Wound Location: Right Axilla Wound Open Wounding Event: Gradually Appeared Status: Date Acquired: 05/02/2019 Comorbid Cataracts, Chronic Obstructive Pulmonary Weeks Of Treatment: 3 History: Disease (COPD), Hypertension, Myocardial Clustered Wound: No Infarction Photos Wound Measurements Length: (cm) 0.1 Width: (cm) 1.6 Depth: (cm) 0.7 Area: (cm) 0.126 Volume: (cm) 0.088 % Reduction in Area: 90% % Reduction in Volume: 97.5% Epithelialization: Medium (34-66%) Tunneling: No Undermining: No Wound Description Full Thickness Without Exposed Support Foul Odo Classification: Structures Slough/F Wound Margin: Flat and Intact Exudate Medium Amount: Exudate Type: Serous Exudate Color: amber r After Cleansing: No ibrino Yes Wound Bed Granulation Amount: Large (67-100%) Exposed Structure Granulation Quality: Pink Fascia Exposed: No Necrotic Amount: Small (1-33%) Fat Layer (Subcutaneous Tissue)  Exposed: Yes Necrotic Quality: Adherent Slough Tendon Exposed: No Muscle Exposed: No Joint Exposed: No Bone Exposed: No Giangregorio, Naureen ([id number]) Treatment Notes Wound #1 (Right Axilla) Notes silvercel, BFD Electronic Signature(s) Signed: 06/11/2019 4:52:40 PM By: Montey Hora Entered By: Montey Hora on 06/11/2019 08:26:56

## 2019-06-11 NOTE — Telephone Encounter (Signed)
Received Rx request for Ventolin from [facility]. Pt was last seen 07/2018 and does not currently have pending ov.  Ventolin was last refilled 03/2019 with note to pharmacy that appt was needed for further refills.  Rx for ventolin has been denied.

## 2019-06-13 ENCOUNTER — Encounter: Payer: Self-pay | Admitting: Family

## 2019-06-13 ENCOUNTER — Encounter: Payer: Medicare Other | Admitting: Physician Assistant

## 2019-06-13 ENCOUNTER — Other Ambulatory Visit: Payer: Self-pay

## 2019-06-13 ENCOUNTER — Ambulatory Visit (INDEPENDENT_AMBULATORY_CARE_PROVIDER_SITE_OTHER): Payer: Medicare Other | Admitting: Family

## 2019-06-13 VITALS — BP 112/72 | HR 66 | Ht 64.0 in | Wt 190.5 lb

## 2019-06-13 DIAGNOSIS — I2510 Atherosclerotic heart disease of native coronary artery without angina pectoris: Secondary | ICD-10-CM | POA: Diagnosis not present

## 2019-06-13 DIAGNOSIS — I255 Ischemic cardiomyopathy: Secondary | ICD-10-CM

## 2019-06-13 DIAGNOSIS — L02411 Cutaneous abscess of right axilla: Secondary | ICD-10-CM | POA: Diagnosis not present

## 2019-06-13 DIAGNOSIS — E782 Mixed hyperlipidemia: Secondary | ICD-10-CM

## 2019-06-13 DIAGNOSIS — Z720 Tobacco use: Secondary | ICD-10-CM | POA: Diagnosis not present

## 2019-06-13 NOTE — Progress Notes (Signed)
BETSABE, IGLESIA ([id number]) Visit Report for 06/13/2019 Arrival Information Details Patient Name: [name], [name] Date of Service: 06/13/2019 8:15 AM Medical Record Number: [id number] Patient Account Number: [id number] Date of Birth/Sex: [birth year] (64 y.o. F) Treating RN: Cornell Barman Primary Care Ival Pacer: PATIENT, NO Other Clinician: Referring Wil Slape: Benjamine Sprague Treating Newman Waren/Extender: Melburn Hake, HOYT Weeks in Treatment: 4 Visit Information History Since Last Visit Added or deleted any medications: No Patient Arrived: Ambulatory Any new allergies or adverse reactions: No Arrival Time: 08:04 Had a fall or experienced change in No Accompanied By: self activities of daily living that may affect Transfer Assistance: None risk of falls: Patient Identification Verified: Yes Signs or symptoms of abuse/neglect since last visito No Secondary Verification Process Completed: Yes Hospitalized since last visit: No Implantable device outside of the clinic excluding No cellular tissue based products placed in the center since last visit: Has Dressing in Place as Prescribed: Yes Pain Present Now: No Electronic Signature(s) Signed: 06/13/2019 3:47:37 PM By: Gretta Cool, BSN, RN, CWS, Kim RN, BSN Entered By: Gretta Cool, BSN, RN, CWS, Kim on 06/13/2019 08:04:43 [name], [name] ([id number]) -------------------------------------------------------------------------------- Clinic Level of Care Assessment Details Patient Name: [name] Date of Service: 06/13/2019 8:15 AM Medical Record Number: [id number] Patient Account Number: [id number] Date of Birth/Sex: 06-17-1955 (63 y.o. F) Treating RN: Army Melia Primary Care Isador Castille: PATIENT, NO Other Clinician: Referring Kenyona Rena: Benjamine Sprague Treating Naeema Patlan/Extender: Melburn Hake, HOYT Weeks in Treatment: 4 Clinic Level of Care Assessment Items TOOL 4 Quantity Score '[]'  - Use when only an EandM is performed on FOLLOW-UP visit  0 ASSESSMENTS - Nursing Assessment / Reassessment X - Reassessment of Co-morbidities (includes updates in patient status) 1 10 X- 1 5 Reassessment of Adherence to Treatment Plan ASSESSMENTS - Wound and Skin Assessment / Reassessment X - Simple Wound Assessment / Reassessment - one wound 1 5 '[]'  - 0 Complex Wound Assessment / Reassessment - multiple wounds '[]'  - 0 Dermatologic / Skin Assessment (not related to wound area) ASSESSMENTS - Focused Assessment '[]'  - Circumferential Edema Measurements - multi extremities 0 '[]'  - 0 Nutritional Assessment / Counseling / Intervention '[]'  - 0 Lower Extremity Assessment (monofilament, tuning fork, pulses) '[]'  - 0 Peripheral Arterial Disease Assessment (using hand held doppler) ASSESSMENTS - Ostomy and/or Continence Assessment and Care '[]'  - Incontinence Assessment and Management 0 '[]'  - 0 Ostomy Care Assessment and Management (repouching, etc.) PROCESS - Coordination of Care X - Simple Patient / Family Education for ongoing care 1 15 '[]'  - 0 Complex (extensive) Patient / Family Education for ongoing care X- 1 10 Staff obtains Programmer, systems, Records, Test Results / Process Orders '[]'  - 0 Staff telephones HHA, Nursing Homes / Clarify orders / etc '[]'  - 0 Routine Transfer to another Facility (non-emergent condition) '[]'  - 0 Routine Hospital Admission (non-emergent condition) '[]'  - 0 New Admissions / Biomedical engineer / Ordering NPWT, Apligraf, etc. '[]'  - 0 Emergency Hospital Admission (emergent condition) X- 1 10 Simple Discharge Coordination Perra, Jaeline ([id number]) '[]'  - 0 Complex (extensive) Discharge Coordination PROCESS - Special Needs '[]'  - Pediatric / Minor Patient Management 0 '[]'  - 0 Isolation Patient Management '[]'  - 0 Hearing / Language / Visual special needs '[]'  - 0 Assessment of Community assistance (transportation, D/C planning, etc.) '[]'  - 0 Additional assistance / Altered mentation '[]'  - 0 Support Surface(s) Assessment  (bed, cushion, seat, etc.) INTERVENTIONS - Wound Cleansing / Measurement X - Simple Wound Cleansing - one wound 1 5 '[]'  - 0 Complex Wound Cleansing - multiple wounds X- 1  5 Wound Imaging (photographs - any number of wounds) '[]'  - 0 Wound Tracing (instead of photographs) X- 1 5 Simple Wound Measurement - one wound '[]'  - 0 Complex Wound Measurement - multiple wounds INTERVENTIONS - Wound Dressings '[]'  - Small Wound Dressing one or multiple wounds 0 X- 1 15 Medium Wound Dressing one or multiple wounds '[]'  - 0 Large Wound Dressing one or multiple wounds '[]'  - 0 Application of Medications - topical '[]'  - 0 Application of Medications - injection INTERVENTIONS - Miscellaneous '[]'  - External ear exam 0 '[]'  - 0 Specimen Collection (cultures, biopsies, blood, body fluids, etc.) '[]'  - 0 Specimen(s) / Culture(s) sent or taken to Lab for analysis '[]'  - 0 Patient Transfer (multiple staff / Civil Service fast streamer / Similar devices) '[]'  - 0 Simple Staple / Suture removal (25 or less) '[]'  - 0 Complex Staple / Suture removal (26 or more) '[]'  - 0 Hypo / Hyperglycemic Management (close monitor of Blood Glucose) '[]'  - 0 Ankle / Brachial Index (ABI) - do not check if billed separately X- 1 5 Vital Signs [name], [name] ([id number]) Has the patient been seen at the hospital within the last three years: Yes Total Score: 90 Level Of Care: New/Established - Level 3 Electronic Signature(s) Signed: 06/13/2019 2:01:02 PM By: Army Melia Entered By: Army Melia on 06/13/2019 08:24:24 [name], [name] ([id number]) -------------------------------------------------------------------------------- Encounter Discharge Information Details Patient Name: [name] Date of Service: 06/13/2019 8:15 AM Medical Record Number: [id number] Patient Account Number: [id number] Date of Birth/Sex: 06/20/1955 (63 y.o. F) Treating RN: Army Melia Primary Care Azrielle Springsteen: PATIENT, NO Other Clinician: Referring Bence Trapp: Benjamine Sprague Treating Milayna Rotenberg/Extender: Melburn Hake, HOYT Weeks in Treatment: 4 Encounter Discharge Information Items Discharge Condition: Stable Ambulatory Status: Ambulatory Discharge Destination: Home Transportation: Private Auto Accompanied By: self Schedule Follow-up Appointment: Yes Clinical Summary of Care: Electronic Signature(s) Signed: 06/13/2019 2:01:02 PM By: Army Melia Entered By: Army Melia on 06/13/2019 08:25:03 [name], [name] ([id number]) -------------------------------------------------------------------------------- Lower Extremity Assessment Details Patient Name: [name] Date of Service: 06/13/2019 8:15 AM Medical Record Number: [id number] Patient Account Number: [id number] Date of Birth/Sex: November 05, 1955 (63 y.o. F) Treating RN: Cornell Barman Primary Care Cyrilla Durkin: PATIENT, NO Other Clinician: Referring Glorya Bartley: Benjamine Sprague Treating Mykayla Brinton/Extender: Sharalyn Ink in Treatment: 4 Electronic Signature(s) Signed: 06/13/2019 3:47:37 PM By: Gretta Cool, BSN, RN, CWS, Kim RN, BSN Entered By: Gretta Cool, BSN, RN, CWS, Kim on 06/13/2019 08:06:13 [name], [name] ([id number]) -------------------------------------------------------------------------------- Multi Wound Chart Details Patient Name: [name] Date of Service: 06/13/2019 8:15 AM Medical Record Number: [id number] Patient Account Number: [id number] Date of Birth/Sex: 1955-06-12 (63 y.o. F) Treating RN: Army Melia Primary Care Gavyn Zoss: PATIENT, NO Other Clinician: Referring Wash Nienhaus: Benjamine Sprague Treating Natori Gudino/Extender: STONE III, HOYT Weeks in Treatment: 4 Vital Signs Height(in): 64 Pulse(bpm): 52 Weight(lbs): 190 Blood Pressure(mmHg): 139/56 Body Mass Index(BMI): 33 Temperature(F): 98.0 Respiratory Rate 16 (breaths/min): Photos: [N/A:N/A] Wound Location: Right Axilla N/A N/A Wounding Event: Gradually Appeared N/A N/A Primary Etiology: Abscess N/A N/A Comorbid History:  Cataracts, Chronic Obstructive N/A N/A Pulmonary Disease (COPD), Hypertension, Myocardial Infarction Date Acquired: 05/02/2019 N/A N/A Weeks of Treatment: 4 N/A N/A Wound Status: Open N/A N/A Measurements L x W x D 0.1x1.6x0.6 N/A N/A (cm) Area (cm) : 0.126 N/A N/A Volume (cm) : 0.075 N/A N/A % Reduction in Area: 90.00% N/A N/A % Reduction in Volume: 97.90% N/A N/A Classification: Full Thickness Without N/A N/A Exposed Support Structures Exudate Amount: None Present N/A N/A Wound Margin: Flat and Intact N/A N/A Granulation Amount: Large (67-100%) N/A  N/A Granulation Quality: Pink N/A N/A Necrotic Amount: None Present (0%) N/A N/A Exposed Structures: Fat Layer (Subcutaneous N/A N/A Tissue) Exposed: Yes Fascia: No Tendon: No Muscle: No Nagorski, Abigayl ([id number]) Joint: No Bone: No Epithelialization: Medium (34-66%) N/A N/A Treatment Notes Electronic Signature(s) Signed: 06/13/2019 2:01:02 PM By: Army Melia Entered By: Army Melia on 06/13/2019 08:23:06 [name], [name] ([id number]) -------------------------------------------------------------------------------- [place] Details Patient Name: [name] Date of Service: 06/13/2019 8:15 AM Medical Record Number: [id number] Patient Account Number: [id number] Date of Birth/Sex: 1955-06-02 (63 y.o. F) Treating RN: Army Melia Primary Care Yudit Modesitt: PATIENT, NO Other Clinician: Referring Kristopher Delk: Benjamine Sprague Treating Kannon Baum/Extender: Melburn Hake, HOYT Weeks in Treatment: 4 Active Inactive Abuse / Safety / Falls / Self Care Management Nursing Diagnoses: Potential for falls Goals: Patient will remain injury free related to falls Date Initiated: 05/16/2019 Target Resolution Date: 08/09/2019 Goal Status: Active Interventions: Assess fall risk on admission and as needed Notes: Orientation to the Wound Care Program Nursing Diagnoses: Knowledge deficit related to the wound healing center  program Goals: Patient/caregiver will verbalize understanding of the [facility] Program Date Initiated: 05/16/2019 Target Resolution Date: 08/09/2019 Goal Status: Active Interventions: Provide education on orientation to the wound center Notes: Pain, Acute or Chronic Nursing Diagnoses: Pain, acute or chronic: actual or potential Goals: Patient/caregiver will verbalize comfort level met Date Initiated: 05/16/2019 Target Resolution Date: 08/09/2019 Goal Status: Active Interventions: Complete pain assessment as per visit requirements [name], [name] ([id number]) Notes: Soft Tissue Infection Nursing Diagnoses: Impaired tissue integrity Goals: Patient will remain free of wound infection Date Initiated: 05/16/2019 Target Resolution Date: 08/09/2019 Goal Status: Active Interventions: Assess signs and symptoms of infection every visit Notes: Wound/Skin Impairment Nursing Diagnoses: Impaired tissue integrity Goals: Ulcer/skin breakdown will heal within 14 weeks Date Initiated: 05/16/2019 Target Resolution Date: 08/09/2019 Goal Status: Active Interventions: Assess patient/caregiver ability to obtain necessary supplies Assess patient/caregiver ability to perform ulcer/skin care regimen upon admission and as needed Assess ulceration(s) every visit Notes: Electronic Signature(s) Signed: 06/13/2019 2:01:02 PM By: Army Melia Entered By: Army Melia on 06/13/2019 08:22:57 [name], [name] ([id number]) -------------------------------------------------------------------------------- Pain Assessment Details Patient Name: [name] Date of Service: 06/13/2019 8:15 AM Medical Record Number: [id number] Patient Account Number: [id number] Date of Birth/Sex: 26-Jul-1955 (63 y.o. F) Treating RN: Cornell Barman Primary Care Doristine Shehan: PATIENT, NO Other Clinician: Referring Jaydien Panepinto: Benjamine Sprague Treating Nicholaus Steinke/Extender: Melburn Hake, HOYT Weeks in Treatment: 4 Active  Problems Location of Pain Severity and Description of Pain Patient Has Paino No Site Locations Pain Management and Medication Current Pain Management: Notes Patient denies pain at this time. Electronic Signature(s) Signed: 06/13/2019 3:47:37 PM By: Gretta Cool, BSN, RN, CWS, Kim RN, BSN Entered By: Gretta Cool, BSN, RN, CWS, Kim on 06/13/2019 08:04:57 [name], [name] ([id number]) -------------------------------------------------------------------------------- Patient/Caregiver Education Details Patient Name: [name] Date of Service: 06/13/2019 8:15 AM Medical Record Number: [id number] Patient Account Number: [id number] Date of Birth/Gender: 08/21/1955 (63 y.o. F) Treating RN: Army Melia Primary Care Physician: PATIENT, NO Other Clinician: Referring Physician: Benjamine Sprague Treating Physician/Extender: Sharalyn Ink in Treatment: 4 Education Assessment Education Provided To: Patient Education Topics Provided Wound/Skin Impairment: Handouts: Caring for Your Ulcer Methods: Demonstration, Explain/Verbal Responses: State content correctly Electronic Signature(s) Signed: 06/13/2019 2:01:02 PM By: Army Melia Entered By: Army Melia on 06/13/2019 08:24:37 [name], [name] ([id number]) -------------------------------------------------------------------------------- Wound Assessment Details Patient Name: [name] Date of Service: 06/13/2019 8:15 AM Medical Record Number: [id number] Patient Account Number: [id number] Date of Birth/Sex: [birth year] (63 y.o. F) Treating RN: Army Melia Primary Care Lahela Woodin:  PATIENT, NO Other Clinician: Referring Zoha Spranger: Benjamine Sprague Treating Yonas Bunda/Extender: STONE III, HOYT Weeks in Treatment: 4 Wound Status Wound Number: 1 Primary Abscess Etiology: Wound Location: Right Axilla Wound Open Wounding Event: Gradually Appeared Status: Date Acquired: 05/02/2019 Comorbid Cataracts, Chronic Obstructive Pulmonary Weeks Of  Treatment: 4 History: Disease (COPD), Hypertension, Myocardial Clustered Wound: No Infarction Photos Wound Measurements Length: (cm) 0.9 Width: (cm) 0.2 Depth: (cm) 0.2 Area: (cm) 0.141 Volume: (cm) 0.028 % Reduction in Area: 88.8% % Reduction in Volume: 99.2% Epithelialization: Medium (34-66%) Tunneling: No Undermining: No Wound Description Full Thickness Without Exposed Support Foul Odor Classification: Structures Slough/Fi Wound Margin: Flat and Intact Exudate None Present Amount: After Cleansing: No brino Yes Wound Bed Granulation Amount: Large (67-100%) Exposed Structure Granulation Quality: Pink Fascia Exposed: No Necrotic Amount: None Present (0%) Fat Layer (Subcutaneous Tissue) Exposed: Yes Tendon Exposed: No Muscle Exposed: No Joint Exposed: No Bone Exposed: No Treatment Notes [name], Tiandra ([id number]) Wound #1 (Right Axilla) Notes silvercel, BFD Electronic Signature(s) Signed: 06/13/2019 2:01:02 PM By: Army Melia Entered By: Army Melia on 06/13/2019 08:23:34 Scruggs, [name] ([id number]) -------------------------------------------------------------------------------- Vitals Details Patient Name: [name] Date of Service: 06/13/2019 8:15 AM Medical Record Number: [id number] Patient Account Number: [id number] Date of Birth/Sex: February 13, 1956 (64 y.o. F) Treating RN: Cornell Barman Primary Care Capone Schwinn: PATIENT, NO Other Clinician: Referring Kennisha Qin: Benjamine Sprague Treating Shaheed Schmuck/Extender: Melburn Hake, HOYT Weeks in Treatment: 4 Vital Signs Time Taken: 08:06 Temperature (F): 98.0 Height (in): 64 Pulse (bpm): 67 Weight (lbs): 190 Respiratory Rate (breaths/min): 16 Body Mass Index (BMI): 32.6 Blood Pressure (mmHg): 139/56 Reference Range: 80 - 120 mg / dl Electronic Signature(s) Signed: 06/13/2019 3:47:37 PM By: Gretta Cool, BSN, RN, CWS, Kim RN, BSN Entered By: Gretta Cool, BSN, RN, CWS, Kim on 06/13/2019 08:07:45

## 2019-06-13 NOTE — Progress Notes (Signed)
Office Visit    Patient Name: [name] Date of Encounter: 06/13/2019  Primary Care Provider:  Patient, No Pcp Per Primary Cardiologist:  Lorine Bears, MD Electrophysiologist:  None   Chief Complaint    [name] is a 64 y.o. female with a hx of CAD, ischemic cardiomyopathy, COPD, HLD presents today for hospital follow-up  Past Medical History    Past Medical History:  Diagnosis Date  . CAD (coronary artery disease)    a. NSTEMI 10/03/15; b. cardiac cath 10/04/15: LM nl, ost-pLAD 90% s/p PCI/DES, mLAD 80% s/p PCI/DES, mRCA 30%, D1,2,3 nl, p-mLCX 30%, OM1 &2 min irregs, OM3 normal, EF 30-35%, sev HK of mid-distal ant, apical, inf wall, mod elevated LVEDP  . Chronic bronchitis (HCC)   . Chronic cough   . COPD (chronic obstructive pulmonary disease) (HCC)   . Dysrhythmia   . GERD (gastroesophageal reflux disease)   . Heart murmur   . HLD (hyperlipidemia)   . Ischemic cardiomyopathy    a. echo 10/05/15: EF 50%, mild HK of anteroseptal and anterior wall, nl LV diastolic fxn  . Myocardial infarction (HCC) 09/2015  . Status post primary angioplasty with coronary stent   . Tobacco abuse   . Wears dentures    full upper and lower   Past Surgical History:  Procedure Laterality Date  . CARDIAC CATHETERIZATION N/A 10/04/2015   Procedure: Left Heart Cath and Coronary Angiography;  Surgeon: Iran Ouch, MD;  Location: [facility];  Service: Cardiovascular;  Laterality: N/A;  . CARDIAC CATHETERIZATION N/A 10/04/2015   Procedure: Coronary Stent Intervention;  Surgeon: Iran Ouch, MD;  Location: [facility];  Service: Cardiovascular;  Laterality: N/A;  . CATARACT EXTRACTION W/PHACO Right 12/18/2018   Procedure: CATARACT EXTRACTION PHACO AND INTRAOCULAR LENS PLACEMENT (IOC)  RIGHT;  Surgeon: Lockie Mola, MD;  Location: [facility];  Service: Ophthalmology;  Laterality: Right;  . CATARACT EXTRACTION W/PHACO Left 01/08/2019   Procedure:  CATARACT EXTRACTION PHACO AND INTRAOCULAR LENS PLACEMENT (IOC) LEFT;  Surgeon: Lockie Mola, MD;  Location: [facility];  Service: Ophthalmology;  Laterality: Left;  leave arrival at 10:30  Total Time: 00:43.0 Total Equivalent Power: 14.4% Cumulative Dissipated Energy: 6.23  . DRESSING CHANGE UNDER ANESTHESIA Right 05/08/2019   Procedure: DRESSING CHANGE UNDER ANESTHESIA;  Surgeon: Sung Amabile, DO;  Location: [facility];  Service: General;  Laterality: Right;  . INCISION AND DRAINAGE ABSCESS Right 05/06/2019   Procedure: INCISION AND DRAINAGE ABSCESS, RIGHT;  Surgeon: Sung Amabile, DO;  Location: [facility];  Service: General;  Laterality: Right;    Allergies  Allergies  Allergen Reactions  . Penicillins Rash    Has patient had a PCN reaction causing immediate rash, facial/tongue/throat swelling, SOB or lightheadedness with hypotension: Yes Has patient had a PCN reaction causing severe rash involving mucus membranes or skin necrosis: No Has patient had a PCN reaction that required hospitalization No Has patient had a PCN reaction occurring within the last 10 years: No If all of the above answers are "NO", then may proceed with Cephalosporin use.    History of Present Illness    [name] is a 64 y.o. female with a hx of CAD, ischemic cardiomyopathy, COPD, HLD, tobacco abuse last seen 03/2019 by Dr. Kirke Corin.  Presented in June 2017 with small NSTEMI.  Underwent cardiac cath showing severe proximal and mid LAD disease.  Underwent successful angioplasty and 2 overlapping DES to the LAD without complications.  EF on LV gram  30-35% but improved on echo prior to hospital discharge to 50%.  Previous ABI December 2019 which was normal bilaterally.  She was admitted to [facility] 05/04/2019 -05/08/2019 for cellulitis of right axilla and breast with evolving abscess.  She underwent I&D on 05/06/2019.  She was discharged with home health for wet-to-dry dressing changes.  Treated with IV  antibiotics and discharged with antibiotics.  Reports feeling well. She has been seeing the wound care clinic and tells me it is healing well.  Her chief complaint today is that she is almost out of her daily inhaler and I encouraged to her discuss with her pulmonologist.   Denies chest pain, pressure, tightness. No SOB nor DOE. Endorses trying to eat a heart healthy diet and avoid salt. No formal exercise regimen. Denies symptoms of claudication.   EKGs/Labs/Other Studies Reviewed:   The following studies were reviewed today:  EKG:  EKG is ordered today.  The ekg ordered today demonstrates SR 66 bpm with no acute ST/T wave changes.  Recent Labs: 05/05/2019: ALT 19 05/08/2019: BUN 15; Creatinine, Ser 0.72; Hemoglobin 9.7; Platelets 316; Potassium 4.0; Sodium 139  Recent Lipid Panel    Component Value Date/Time   CHOL 228 (H) 04/15/2018 0811   TRIG 147 04/15/2018 0811   HDL 95 04/15/2018 0811   CHOLHDL 2.4 04/15/2018 0811   CHOLHDL 2.4 06/13/2016 1133   VLDL 26 06/13/2016 1133   LDLCALC 104 (H) 04/15/2018 0811    Home Medications   Current Meds  Medication Sig  . albuterol (VENTOLIN HFA) 108 (90 Base) MCG/ACT inhaler INHALE 2 PUFFS INTO THE LUNGS EVERY 6 HOURS AS NEEDED FOR WHEEZING OR SHORTNESS OF BREATH.  [name] aspirin 81 MG chewable tablet Chew 1 tablet (81 mg total) by mouth daily.  . carvedilol (COREG) 3.125 MG tablet TAKE 1 TABLET BY MOUTH TWICE A DAY  . HYDROcodone-acetaminophen (NORCO) 5-325 MG tablet Take 1 tablet by mouth every 6 (six) hours as needed for up to 12 doses for moderate pain or severe pain.  [name] ibuprofen (ADVIL) 800 MG tablet Take 1 tablet (800 mg total) by mouth every 8 (eight) hours as needed for mild pain or moderate pain.  [name] losartan (COZAAR) 50 MG tablet Take 1 tablet (50 mg total) by mouth daily.  [name] omeprazole (PRILOSEC) 20 MG capsule TAKE 1 CAPSULE BY MOUTH TWICE DAILY BEFORE MEALS  . PRALUENT 75 MG/ML SOAJ INJECT 75 MG INTO THE SKIN EVERY 14 DAYSAS  DIRECTED  . rosuvastatin (CRESTOR) 40 MG tablet TAKE 1 TABLET BY MOUTH ONCE A DAY  . sulfamethoxazole-trimethoprim (BACTRIM DS) 800-160 MG tablet Take 1 tablet by mouth 2 (two) times daily.  . SYMBICORT 160-4.5 MCG/ACT inhaler INHALE 1 PUFF INTO THE LUNGS 2 TIMES DAILY. RINSE MOUTH AFTER USE.    Review of Systems      Review of Systems  Constitution: Negative for chills, fever and malaise/fatigue.  Cardiovascular: Negative for chest pain, dyspnea on exertion, leg swelling, near-syncope, orthopnea, palpitations and syncope.  Respiratory: Negative for cough, shortness of breath and wheezing.   Gastrointestinal: Negative for nausea and vomiting.  Neurological: Negative for dizziness, light-headedness and weakness.   All other systems reviewed and are otherwise negative except as noted above.  Physical Exam    VS:  BP 112/72 (BP Location: Right Arm, Patient Position: Sitting, Cuff Size: Normal)   Pulse 66   Ht 5\' 4"  (1.626 m)   Wt 190 lb 8 oz (86.4 kg)   SpO2 98%   BMI 32.70 kg/m  ,  BMI Body mass index is 32.7 kg/m. GEN: Well nourished, overweight,  well developed, in no acute distress. HEENT: normal. Neck: Supple, no JVD, carotid bruits, or masses. Cardiac: RRR, no murmurs, rubs, or gallops. No clubbing, cyanosis, edema.  Radials/DP/PT 2+ and equal bilaterally.  Respiratory:  Respirations regular and unlabored, clear to auscultation bilaterally. GI: Soft, nontender, nondistended, BS + x 4. MS: No deformity or atrophy. Skin: Warm and dry, no rash. Neuro:  Strength and sensation are intact. Psych: Normal affect.  Accessory Clinical Findings    ECG personally reviewed by me today - SR 66 bpm - no acute changes.  Assessment & Plan    1. CAD - s/p NSTEMI 09/2015 with DESx2 to LAD. Stable with no anginal symptoms. EKG today with no acute ST/T wave changes. No indication for ischemic evaluation at this time.  GDMT includes aspirin, beta blocker, statin.   2. ICM - Most recent echo  with EF improved to 50%. Continue low dose Carvedilol and Losartan.   3. HLD - LDL goal <70. Continue Rosuvastatin and Praluent.   4. Tobacco abuse - Has cut back significantly and encouraged to continue. Smoking cessation encouraged. Recommend utilization of [id number].  5. HTN - BP well controlled. Continue Coreg 3.125mg  BID, Losartan 50mg  daily.  6. Obesity - Weight loss via healthy diet and regular exercise encouraged.  Disposition: Follow up in 6 month(s) with Dr. Fletcher Anon or APP.    Loel Dubonnet, NP 06/13/2019, 9:40 AM

## 2019-06-13 NOTE — Patient Instructions (Signed)
Medication Instructions:  No medication changes today.  *If you need a refill on your cardiac medications before your next appointment, please call your pharmacy*  Lab Work: Your physician recommends that you return for lab work today: lipid panel  If you have labs (blood work) drawn today and your tests are completely normal, you will receive your results only by: [name] MyChart Message (if you have MyChart) OR . A paper copy in the mail If you have any lab test that is abnormal or we need to change your treatment, we will call you to review the results.  Testing/Procedures: You had an EKG today. It showed normal sinus rhythm which is a good result!  Follow-Up: At [facility], you and your health needs are our priority.  As part of our continuing mission to provide you with exceptional heart care, we have created designated Provider Care Teams.  These Care Teams include your primary Cardiologist (physician) and Advanced Practice Providers (APPs -  Physician Assistants and Nurse Practitioners) who all work together to provide you with the care you need, when you need it.  Your next appointment:   6 month(s)  The format for your next appointment:   In Person  Provider:    You may see Lorine Bears, MD or one of the following Advanced Practice Providers on your designated Care Team:    Nicolasa Ducking, NP  Eula Listen, PA-C  Marisue Ivan, PA-C   Other Instructions  Recommend following up with pulmonology or primary care regarding your inhalers.

## 2019-06-13 NOTE — Progress Notes (Addendum)
FERNANDE, ORTEGO ([id number]) Visit Report for 06/13/2019 Chief Complaint Document Details Patient Name: [name], [name] Date of Service: 06/13/2019 8:15 AM Medical Record Number: [id number] Patient Account Number: [id number] Date of Birth/Sex: [birth year] (64 y.o. F) Treating RN: Curtis Sites Primary Care Provider: PATIENT, NO Other Clinician: Referring Provider: Sung Amabile Treating Provider/Extender: Linwood Dibbles, Tamme Mozingo Weeks in Treatment: 4 Information Obtained from: Patient Chief Complaint Right Axilla abscess and ulcer Electronic Signature(s) Signed: 06/13/2019 8:17:54 AM By: Lenda Kelp PA-C Entered By: Lenda Kelp on 06/13/2019 08:17:54 Cuaresma, [name] ([id number]) -------------------------------------------------------------------------------- HPI Details Patient Name: [name] Date of Service: 06/13/2019 8:15 AM Medical Record Number: [id number] Patient Account Number: [id number] Date of Birth/Sex: [birth year] (63 y.o. F) Treating RN: Curtis Sites Primary Care Provider: PATIENT, NO Other Clinician: Referring Provider: Sung Amabile Treating Provider/Extender: Linwood Dibbles, Aelyn Stanaland Weeks in Treatment: 4 History of Present Illness HPI Description: 05/16/2019 patient presents today for initial evaluation in our clinic concerning a abscess she had in the right axillary region. This was subsequently evaluated by Dr. Tonna Boehringer in the hospital and he did have to open this up in order to drain the abscess. Subsequent to that it was recommended that the patient have this packed on a regular basis with saline wet-to- dry dressings. Unfortunately the patient does not have anyone who can really help her with this. Only her son is apparently in the picture and she states that he would "pass out" if he were to have to do this. Nonetheless she has been in a very tight spot she actually is gone back to the hospital to have this changed at one point and then subsequently they  recommended she go back to the surgeon Dr. Tonna Boehringer actually referred her to [place] to help manage the patient's wound. With that being said currently she states that she does not have any discomfort except for if the area is being packed with the packing is removed. She does have a history of hypertension but currently states that that is typically under pretty good control. With regard to the wound itself I do believe that this is something that is going to need to be packed on a more regular basis. This may and tell us having to do this some here in the clinic and then potentially getting her home health as well if were able to do so. I do think it is good to be of utmost importance however to keep this packing changed if at all possible daily although weekends are good to be difficult no matter what. No fevers, chills, nausea, vomiting, or diarrhea. 05/23/2019 upon evaluation today patient actually appears to be doing excellent in regard to her wound. She has been tolerating the dressing changes with the silver alginate without complication. Fortunately I am very pleased with the progress she is made this seems to be measuring better and in just 1 week's time is much cleaner she also tells me is not hurting like it was previous which is also excellent news. All in all I do not see anything that really has me concerned at this point which is great news as well. 05/30/2019 on evaluation today patient appears to be doing very well with regard to her ulcer. She has been tolerating the dressing changes without complication. This is measuring much better as far as the overall size is also measuring better as far as depth. Overall I feel like we are seeing good improvement. Potentially we could consider a snap VAC that may speed things up  even better for her. With that being said I am unsure whether her insurance would cover this. Nonetheless we can definitely continue with where things are now as far as  treatment is concerned. 06/06/2019 upon evaluation today patient appears to be doing well with regard to her wound in the right axillary region. She has been tolerating the dressing changes without complication. Fortunately there is no signs of active infection at this time. No fevers, chills, nausea, vomiting, or diarrhea. With that being said she does have something that we did notice to close and then at a visit she has a small what appears to be hair bump that is inferior to the wound. Nonetheless I do believe that she is going to require at this point potentially some antibiotics to try to help out in this regard. I do not want this to turn into anything more significant like what she had before. 06/13/2019 on evaluation today patient appears to be doing well with regard to her axillary ulcer. She has been tolerating the dressing changes without complication. Fortunately there is no signs of active infection at this time which is great news. No fevers, chills, nausea, vomiting, or diarrhea. Electronic Signature(s) Signed: 06/13/2019 8:35:52 AM By: Lenda Kelp PA-C Entered By: Lenda Kelp on 06/13/2019 08:35:52 Bitton, [name] ([id number]) -------------------------------------------------------------------------------- Physical Exam Details Patient Name: [name] Date of Service: 06/13/2019 8:15 AM Medical Record Number: [id number] Patient Account Number: [id number] Date of Birth/Sex: 21-Dec-1955 (63 y.o. F) Treating RN: Curtis Sites Primary Care Provider: PATIENT, NO Other Clinician: Referring Provider: Sung Amabile Treating Provider/Extender: STONE III, Kale Dols Weeks in Treatment: 4 Constitutional Obese and well-hydrated in no acute distress. Respiratory normal breathing without difficulty. Psychiatric this patient is able to make decisions and demonstrates good insight into disease process. Alert and Oriented x 3. pleasant and cooperative. Notes Patient's wound  does not cause her any pain at this point she is not completely closed over internally but this is healing quite nicely and I am very pleased in this regard. There is no signs of active infection at this point. Electronic Signature(s) Signed: 06/13/2019 8:36:08 AM By: Lenda Kelp PA-C Entered By: Lenda Kelp on 06/13/2019 08:36:08 Satterfield, [name] ([id number]) -------------------------------------------------------------------------------- Physician Orders Details Patient Name: [name] Date of Service: 06/13/2019 8:15 AM Medical Record Number: [id number] Patient Account Number: [id number] Date of Birth/Sex: 01/29/1956 (63 y.o. F) Treating RN: Rodell Perna Primary Care Provider: PATIENT, NO Other Clinician: Referring Provider: Sung Amabile Treating Provider/Extender: Linwood Dibbles, Dystany Duffy Weeks in Treatment: 4 Verbal / Phone Orders: No Diagnosis Coding ICD-10 Coding Code Description L02.411 Cutaneous abscess of right axilla L98.492 Non-pressure chronic ulcer of skin of other sites with fat layer exposed I10 Essential (primary) hypertension Wound Cleansing Wound #1 Right Axilla o Clean wound with Normal Saline. o May shower with protection. - Do not get your wound wet Primary Wound Dressing Wound #1 Right Axilla o Silver Alginate Secondary Dressing Wound #1 Right Axilla o Boardered Foam Dressing Dressing Change Frequency Wound #1 Right Axilla o Change dressing every day. - Mondays, Wednesdays and Fridays in clinic Follow-up Appointments o Return Appointment in 1 week. o Nurse Visit as needed Home Health Wound #1 Right Axilla o Continue Home Health Visits - [facility] o Home Health Nurse may visit PRN to address patientos wound care needs. o FACE TO FACE ENCOUNTER: MEDICARE and MEDICAID PATIENTS: I certify that this patient is under my care and that I had a face-to-face encounter that meets the physician face-to-face encounter  requirements with  this patient on this date. The encounter with the patient was in whole or in part for the following MEDICAL CONDITION: (primary reason for Home Healthcare) MEDICAL NECESSITY: I certify, that based on my findings, NURSING services are a medically necessary home health service. HOME BOUND STATUS: I certify that my clinical findings support that this patient is homebound (i.e., Due to illness or injury, pt requires aid of supportive devices such as crutches, cane, wheelchairs, walkers, the use of special transportation or the assistance of another person to leave their place of residence. There is a normal inability to leave the home and doing so requires considerable and taxing effort. Other absences are for medical reasons / religious services and are infrequent or of short duration when for other reasons). Waterson, [name] ([id number]) o If current dressing causes regression in wound condition, may D/C ordered dressing product/s and apply Normal Saline Moist Dressing daily until next Wound Healing Center / Other MD appointment. Notify Wound Healing Center of regression in wound condition at [phone]. o Please direct any NON-WOUND related issues/requests for orders to patient's Primary Care Physician Electronic Signature(s) Signed: 06/13/2019 2:01:02 PM By: Rodell Perna Signed: 06/13/2019 3:41:32 PM By: Lenda Kelp PA-C Entered By: Rodell Perna on 06/13/2019 08:23:55 Loja, [name] ([id number]) -------------------------------------------------------------------------------- Problem List Details Patient Name: [name] Date of Service: 06/13/2019 8:15 AM Medical Record Number: [id number] Patient Account Number: [id number] Date of Birth/Sex: 11-17-1955 (63 y.o. F) Treating RN: Curtis Sites Primary Care Provider: PATIENT, NO Other Clinician: Referring Provider: Sung Amabile Treating Provider/Extender: Linwood Dibbles, Arno Cullers Weeks in Treatment: 4 Active  Problems ICD-10 Evaluated Encounter Code Description Active Date Today Diagnosis L02.411 Cutaneous abscess of right axilla 05/16/2019 No Yes L98.492 Non-pressure chronic ulcer of skin of other sites with fat layer 05/16/2019 No Yes exposed I10 Essential (primary) hypertension 05/16/2019 No Yes Inactive Problems Resolved Problems Electronic Signature(s) Signed: 06/13/2019 8:16:48 AM By: Lenda Kelp PA-C Entered By: Lenda Kelp on 06/13/2019 08:16:48 Kolasa, Rudene ([id number]) -------------------------------------------------------------------------------- Progress Note Details Patient Name: [name] Date of Service: 06/13/2019 8:15 AM Medical Record Number: [id number] Patient Account Number: [id number] Date of Birth/Sex: October 25, 1955 (63 y.o. F) Treating RN: Curtis Sites Primary Care Provider: PATIENT, NO Other Clinician: Referring Provider: Sung Amabile Treating Provider/Extender: Linwood Dibbles, Paidyn Mcferran Weeks in Treatment: 4 Subjective Chief Complaint Information obtained from Patient Right Axilla abscess and ulcer History of Present Illness (HPI) 05/16/2019 patient presents today for initial evaluation in our clinic concerning a abscess she had in the right axillary region. This was subsequently evaluated by Dr. Tonna Boehringer in the hospital and he did have to open this up in order to drain the abscess. Subsequent to that it was recommended that the patient have this packed on a regular basis with saline wet-to-dry dressings. Unfortunately the patient does not have anyone who can really help her with this. Only her son is apparently in the picture and she states that he would "pass out" if he were to have to do this. Nonetheless she has been in a very tight spot she actually is gone back to the hospital to have this changed at one point and then subsequently they recommended she go back to the surgeon Dr. Tonna Boehringer actually referred her to [place] to help manage the patient's wound.  With that being said currently she states that she does not have any discomfort except for if the area is being packed with the packing is removed. She does have a history of hypertension but currently  states that that is typically under pretty good control. With regard to the wound itself I do believe that this is something that is going to need to be packed on a more regular basis. This may and tell us having to do this some here in the clinic and then potentially getting her home health as well if were able to do so. I do think it is good to be of utmost importance however to keep this packing changed if at all possible daily although weekends are good to be difficult no matter what. No fevers, chills, nausea, vomiting, or diarrhea. 05/23/2019 upon evaluation today patient actually appears to be doing excellent in regard to her wound. She has been tolerating the dressing changes with the silver alginate without complication. Fortunately I am very pleased with the progress she is made this seems to be measuring better and in just 1 week's time is much cleaner she also tells me is not hurting like it was previous which is also excellent news. All in all I do not see anything that really has me concerned at this point which is great news as well. 05/30/2019 on evaluation today patient appears to be doing very well with regard to her ulcer. She has been tolerating the dressing changes without complication. This is measuring much better as far as the overall size is also measuring better as far as depth. Overall I feel like we are seeing good improvement. Potentially we could consider a snap VAC that may speed things up even better for her. With that being said I am unsure whether her insurance would cover this. Nonetheless we can definitely continue with where things are now as far as treatment is concerned. 06/06/2019 upon evaluation today patient appears to be doing well with regard to her wound in the  right axillary region. She has been tolerating the dressing changes without complication. Fortunately there is no signs of active infection at this time. No fevers, chills, nausea, vomiting, or diarrhea. With that being said she does have something that we did notice to close and then at a visit she has a small what appears to be hair bump that is inferior to the wound. Nonetheless I do believe that she is going to require at this point potentially some antibiotics to try to help out in this regard. I do not want this to turn into anything more significant like what she had before. 06/13/2019 on evaluation today patient appears to be doing well with regard to her axillary ulcer. She has been tolerating the dressing changes without complication. Fortunately there is no signs of active infection at this time which is great news. No fevers, chills, nausea, vomiting, or diarrhea. Modesto, Sonjia ([id number]) Objective Constitutional Obese and well-hydrated in no acute distress. Vitals Time Taken: 8:06 AM, Height: 64 in, Weight: 190 lbs, BMI: 32.6, Temperature: 98.0 F, Pulse: 67 bpm, Respiratory Rate: 16 breaths/min, Blood Pressure: 139/56 mmHg. Respiratory normal breathing without difficulty. Psychiatric this patient is able to make decisions and demonstrates good insight into disease process. Alert and Oriented x 3. pleasant and cooperative. General Notes: Patient's wound does not cause her any pain at this point she is not completely closed over internally but this is healing quite nicely and I am very pleased in this regard. There is no signs of active infection at this point. Integumentary (Hair, Skin) Wound #1 status is Open. Original cause of wound was Gradually Appeared. The wound is located on the Right Axilla. The wound  measures 0.9cm length x 0.2cm width x 0.2cm depth; 0.141cm^2 area and 0.028cm^3 volume. There is Fat Layer (Subcutaneous Tissue) Exposed exposed. There is no  tunneling or undermining noted. There is a none present amount of drainage noted. The wound margin is flat and intact. There is large (67-100%) pink granulation within the wound bed. There is no necrotic tissue within the wound bed. Assessment Active Problems ICD-10 Cutaneous abscess of right axilla Non-pressure chronic ulcer of skin of other sites with fat layer exposed Essential (primary) hypertension Plan Wound Cleansing: Wound #1 Right Axilla: Clean wound with Normal Saline. May shower with protection. - Do not get your wound wet Primary Wound Dressing: Wound #1 Right Axilla: Silver Alginate Secondary Dressing: Wound #1 Right Axilla: Leiker, Veretta ([id number]) Boardered Foam Dressing Dressing Change Frequency: Wound #1 Right Axilla: Change dressing every day. - Mondays, Wednesdays and Fridays in clinic Follow-up Appointments: Return Appointment in 1 week. Nurse Visit as needed Home Health: Wound #1 Right Axilla: Continue [facility] Health Visits - [facility] Nurse may visit PRN to address patient s wound care needs. FACE TO FACE ENCOUNTER: MEDICARE and MEDICAID PATIENTS: I certify that this patient is under my care and that I had a face-to-face encounter that meets the physician face-to-face encounter requirements with this patient on this date. The encounter with the patient was in whole or in part for the following MEDICAL CONDITION: (primary reason for Home Healthcare) MEDICAL NECESSITY: I certify, that based on my findings, NURSING services are a medically necessary home health service. HOME BOUND STATUS: I certify that my clinical findings support that this patient is homebound (i.e., Due to illness or injury, pt requires aid of supportive devices such as crutches, cane, wheelchairs, walkers, the use of special transportation or the assistance of another person to leave their place of residence. There is a normal inability to leave the home and doing so  requires considerable and taxing effort. Other absences are for medical reasons / religious services and are infrequent or of short duration when for other reasons). If current dressing causes regression in wound condition, may D/C ordered dressing product/s and apply Normal Saline Moist Dressing daily until next Wound Healing Center / Other MD appointment. Notify Wound Healing Center of regression in wound condition at [phone]. Please direct any NON-WOUND related issues/requests for orders to patient's Primary Care Physician 1. I would recommend currently that we go ahead and initiate treatment with a continuation of the packing with silver alginate that seems to have done well up to this point. The patient is in agreement with that plan. 2. Also recommend that she can change this really I would say a month state on Monday Wednesday and Friday as I think that is a good regimen at this point especially considering the fact is not draining nearly as much. We will see patient back for reevaluation in 1 week here in the clinic. If anything worsens or changes patient will contact our office for additional recommendations. Electronic Signature(s) Signed: 06/13/2019 8:37:03 AM By: Lenda Kelp PA-C Entered By: Lenda Kelp on 06/13/2019 08:37:03 Weinfeld, [name] ([id number]) -------------------------------------------------------------------------------- SuperBill Details Patient Name: [name] Date of Service: 06/13/2019 Medical Record Number: [id number] Patient Account Number: [id number] Date of Birth/Sex: November 08, 1955 (63 y.o. F) Treating RN: Curtis Sites Primary Care Provider: PATIENT, NO Other Clinician: Referring Provider: Sung Amabile Treating Provider/Extender: STONE III, Kayden Amend Weeks in Treatment: 4 Diagnosis Coding ICD-10 Codes Code Description L02.411 Cutaneous abscess of right axilla L98.492 Non-pressure chronic ulcer of  skin of other sites with fat layer  exposed I10 Essential (primary) hypertension Facility Procedures CPT4 Code: 15520802 Description: 99213 - WOUND CARE VISIT-LEV 3 EST PT Modifier: Quantity: 1 Physician Procedures CPT4 Code: 2336122 Description: 99213 - WC PHYS LEVEL 3 - EST PT ICD-10 Diagnosis Description L02.411 Cutaneous abscess of right axilla L98.492 Non-pressure chronic ulcer of skin of other sites with fat l I10 Essential (primary) hypertension Modifier: ayer exposed Quantity: 1 Electronic Signature(s) Signed: 06/13/2019 8:37:31 AM By: Lenda Kelp PA-C Entered By: Lenda Kelp on 06/13/2019 08:37:31

## 2019-06-14 LAB — LIPID PANEL
Chol/HDL Ratio: 1.9 ratio (ref 0.0–4.4)
Cholesterol, Total: 148 mg/dL (ref 100–199)
HDL: 78 mg/dL (ref >39)
LDL Chol Calc (NIH): 53 mg/dL (ref 0–99)
Triglycerides: 95 mg/dL (ref 0–149)
VLDL Cholesterol Cal: 17 mg/dL (ref 5–40)

## 2019-06-16 ENCOUNTER — Other Ambulatory Visit: Payer: Self-pay

## 2019-06-16 DIAGNOSIS — L02411 Cutaneous abscess of right axilla: Secondary | ICD-10-CM | POA: Diagnosis not present

## 2019-06-16 NOTE — Progress Notes (Signed)
CICLALY, MULCAHEY ([id number]) Visit Report for 06/16/2019 Arrival Information Details Patient Name: JELISHA, WEED Date of Service: 06/16/2019 8:30 AM Medical Record Number: [id number] Patient Account Number: [id number] Date of Birth/Sex: 09/24/1955 (64 y.o. F) Treating RN: Curtis Sites Primary Care Darold Miley: PATIENT, NO Other Clinician: Referring Chloie Loney: Sung Amabile Treating Amiyrah Lamere/Extender: Linwood Dibbles, HOYT Weeks in Treatment: 4 Visit Information History Since Last Visit Added or deleted any medications: No Patient Arrived: Ambulatory Any new allergies or adverse reactions: No Arrival Time: 08:05 Had a fall or experienced change in No Accompanied By: self activities of daily living that may affect Transfer Assistance: None risk of falls: Patient Identification Verified: Yes Signs or symptoms of abuse/neglect since last visito No Secondary Verification Process Completed: Yes Hospitalized since last visit: No Implantable device outside of the clinic excluding No cellular tissue based products placed in the center since last visit: Has Dressing in Place as Prescribed: Yes Pain Present Now: No Notes 98.0 Electronic Signature(s) Signed: 06/16/2019 8:05:43 AM By: Curtis Sites Entered By: Curtis Sites on 06/16/2019 08:05:43 Coston, [name] ([id number]) -------------------------------------------------------------------------------- Encounter Discharge Information Details Patient Name: [name] Date of Service: 06/16/2019 8:30 AM Medical Record Number: [id number] Patient Account Number: [id number] Date of Birth/Sex: March 10, 1956 (63 y.o. F) Treating RN: Curtis Sites Primary Care Josalynn Johndrow: PATIENT, NO Other Clinician: Referring Keelon Zurn: Sung Amabile Treating Leandrea Ackley/Extender: Linwood Dibbles, HOYT Weeks in Treatment: 4 Encounter Discharge Information Items Discharge Condition: Stable Ambulatory Status: Ambulatory Discharge Destination:  Home Transportation: Private Auto Accompanied By: self Schedule Follow-up Appointment: Yes Clinical Summary of Care: Electronic Signature(s) Signed: 06/16/2019 8:06:49 AM By: Curtis Sites Entered By: Curtis Sites on 06/16/2019 08:06:49 Fells, [name] ([id number]) -------------------------------------------------------------------------------- Wound Assessment Details Patient Name: [name] Date of Service: 06/16/2019 8:30 AM Medical Record Number: [id number] Patient Account Number: [id number] Date of Birth/Sex: [birth year] (63 y.o. F) Treating RN: Curtis Sites Primary Care Julius Boniface: PATIENT, NO Other Clinician: Referring Joslyne Marshburn: Sung Amabile Treating Destane Speas/Extender: STONE III, HOYT Weeks in Treatment: 4 Wound Status Wound Number: 1 Primary Abscess Etiology: Wound Location: Right Axilla Wound Open Wounding Event: Gradually Appeared Status: Date Acquired: 05/02/2019 Comorbid Cataracts, Chronic Obstructive Pulmonary Weeks Of Treatment: 4 History: Disease (COPD), Hypertension, Myocardial Clustered Wound: No Infarction Wound Measurements Length: (cm) 0.9 Width: (cm) 0.2 Depth: (cm) 0.2 Area: (cm) 0.141 Volume: (cm) 0.028 % Reduction in Area: 88.8% % Reduction in Volume: 99.2% Epithelialization: Medium (34-66%) Tunneling: No Undermining: No Wound Description Full Thickness Without Exposed Support Foul Od Classification: Structures Slough/ Wound Margin: Flat and Intact Exudate Small Amount: Exudate Type: Serous Exudate Color: amber or After Cleansing: No Fibrino No Wound Bed Granulation Amount: Large (67-100%) Exposed Structure Granulation Quality: Pink Fascia Exposed: No Necrotic Amount: None Present (0%) Fat Layer (Subcutaneous Tissue) Exposed: Yes Tendon Exposed: No Muscle Exposed: No Joint Exposed: No Bone Exposed: No Treatment Notes Wound #1 (Right Axilla) Notes silvercel, BFD Electronic Signature(s) Signed: 06/16/2019 8:06:23  AM By: Curtis Sites Entered By: Curtis Sites on 06/16/2019 82:50:53

## 2019-06-18 ENCOUNTER — Other Ambulatory Visit: Payer: Self-pay

## 2019-06-18 DIAGNOSIS — L02411 Cutaneous abscess of right axilla: Secondary | ICD-10-CM | POA: Diagnosis not present

## 2019-06-18 NOTE — Progress Notes (Signed)
[name], [name] ([id number]) Visit Report for 06/18/2019 Arrival Information Details Patient Name: [name], [name] Date of Service: 06/18/2019 8:00 AM Medical Record Number: [id number] Patient Account Number: [id number] Date of Birth/Sex: [birth year] (64 y.o. F) Treating RN: Rodell Perna Primary Care Hriday Stai: PATIENT, NO Other Clinician: Referring Merwyn Hodapp: Sung Amabile Treating Thornton Dohrmann/Extender: Altamese [place] in Treatment: 4 Visit Information History Since Last Visit Added or deleted any medications: No Patient Arrived: Ambulatory Any new allergies or adverse reactions: No Arrival Time: 08:06 Had a fall or experienced change in No Accompanied By: self activities of daily living that may affect Transfer Assistance: None risk of falls: Patient Identification Verified: Yes Signs or symptoms of abuse/neglect since last visito No Hospitalized since last visit: No Has Dressing in Place as Prescribed: Yes Pain Present Now: No Electronic Signature(s) Signed: 06/18/2019 10:40:36 AM By: Rodell Perna Entered By: Rodell Perna on 06/18/2019 08:10:04 [name], [name] ([id number]) -------------------------------------------------------------------------------- Clinic Level of Care Assessment Details Patient Name: [name] Date of Service: 06/18/2019 8:00 AM Medical Record Number: [id number] Patient Account Number: [id number] Date of Birth/Sex: [birth year] (64 y.o. F) Treating RN: Rodell Perna Primary Care Deetra Booton: PATIENT, NO Other Clinician: Referring Jovon Winterhalter: Sung Amabile Treating Nadja Lina/Extender: Altamese  in Treatment: 4 Clinic Level of Care Assessment Items TOOL 4 Quantity Score []  - Use when only an EandM is performed on FOLLOW-UP visit 0 ASSESSMENTS - Nursing Assessment / Reassessment X - Reassessment of Co-morbidities (includes updates in patient status) 1 10 X- 1 5 Reassessment of Adherence to Treatment Plan ASSESSMENTS - Wound and  Skin Assessment / Reassessment X - Simple Wound Assessment / Reassessment - one wound 1 5 []  - 0 Complex Wound Assessment / Reassessment - multiple wounds []  - 0 Dermatologic / Skin Assessment (not related to wound area) ASSESSMENTS - Focused Assessment []  - Circumferential Edema Measurements - multi extremities 0 []  - 0 Nutritional Assessment / Counseling / Intervention []  - 0 Lower Extremity Assessment (monofilament, tuning fork, pulses) []  - 0 Peripheral Arterial Disease Assessment (using hand held doppler) ASSESSMENTS - Ostomy and/or Continence Assessment and Care []  - Incontinence Assessment and Management 0 []  - 0 Ostomy Care Assessment and Management (repouching, etc.) PROCESS - Coordination of Care X - Simple Patient / Family Education for ongoing care 1 15 []  - 0 Complex (extensive) Patient / Family Education for ongoing care X- 1 10 Staff obtains , Records, Test Results / Process Orders []  - 0 Staff telephones HHA, Nursing Homes / Clarify orders / etc []  - 0 Routine Transfer to another Facility (non-emergent condition) []  - 0 Routine Hospital Admission (non-emergent condition) []  - 0 New Admissions / / Ordering NPWT, Apligraf, etc. []  - 0 Emergency Hospital Admission (emergent condition) X- 1 10 Simple Discharge Coordination [name], [name] ( ) []  - 0 Complex (extensive) Discharge Coordination PROCESS - Special Needs []  - Pediatric / Minor Patient Management 0 []  - 0 Isolation Patient Management []  - 0 Hearing / Language / Visual special needs []  - 0 Assessment of Community assistance (transportation, D/C planning, etc.) []  - 0 Additional assistance / Altered mentation []  - 0 Support Surface(s) Assessment (bed, cushion, seat, etc.) INTERVENTIONS - Wound Cleansing / Measurement X - Simple Wound Cleansing - one wound 1 5 []  - 0 Complex Wound Cleansing - multiple wounds X- 1 5 Wound Imaging (photographs - any  number of wounds) []  - 0 Wound Tracing (instead of photographs) X- 1 5 Simple Wound Measurement - one wound []  - 0 Complex Wound Measurement -  multiple wounds INTERVENTIONS - Wound Dressings []  - Small Wound Dressing one or multiple wounds 0 X- 1 15 Medium Wound Dressing one or multiple wounds []  - 0 Large Wound Dressing one or multiple wounds []  - 0 Application of Medications - topical []  - 0 Application of Medications - injection INTERVENTIONS - Miscellaneous []  - External ear exam 0 []  - 0 Specimen Collection (cultures, biopsies, blood, body fluids, etc.) []  - 0 Specimen(s) / Culture(s) sent or taken to Lab for analysis []  - 0 Patient Transfer (multiple staff / Harrel Lemon Lift / Similar devices) []  - 0 Simple Staple / Suture removal (25 or less) []  - 0 Complex Staple / Suture removal (26 or more) []  - 0 Hypo / Hyperglycemic Management (close monitor of Blood Glucose) []  - 0 Ankle / Brachial Index (ABI) - do not check if billed separately []  - 0 Vital Signs [name], [name] ([id number]) Has the patient been seen at the hospital within the last three years: Yes Total Score: 85 Level Of Care: New/Established - Level 3 Electronic Signature(s) Signed: 06/18/2019 10:40:36 AM By: Army Melia Entered By: Army Melia on 06/18/2019 08:13:35 [name], [name] ([id number]) -------------------------------------------------------------------------------- Encounter Discharge Information Details Patient Name: [name] Date of Service: 06/18/2019 8:00 AM Medical Record Number: [id number] Patient Account Number: [id number] Date of Birth/Sex: [birth year] (63 y.o. F) Treating RN: Army Melia Primary Care Terrina Docter: PATIENT, NO Other Clinician: Referring Cohl Behrens: Benjamine Sprague Treating Damika Harmon/Extender: Tito Dine in Treatment: 4 Encounter Discharge Information Items Discharge Condition: Stable Ambulatory Status: Ambulatory Discharge Destination:  Home Transportation: Private Auto Accompanied By: self Schedule Follow-up Appointment: Yes Clinical Summary of Care: Electronic Signature(s) Signed: 06/18/2019 10:40:36 AM By: Army Melia Entered By: Army Melia on 06/18/2019 08:13:06 [name], [name] ([id number]) -------------------------------------------------------------------------------- Wound Assessment Details Patient Name: [name] Date of Service: 06/18/2019 8:00 AM Medical Record Number: [id number] Patient Account Number: [id number] Date of Birth/Sex: 09-22-1955 (63 y.o. F) Treating RN: Army Melia Primary Care Jcion Buddenhagen: PATIENT, NO Other Clinician: Referring Rayce Brahmbhatt: Benjamine Sprague Treating Aiyanna Awtrey/Extender: Tito Dine in Treatment: 4 Wound Status Wound Number: 1 Primary Abscess Etiology: Wound Location: Right Axilla Wound Open Wounding Event: Gradually Appeared Status: Date Acquired: 05/02/2019 Comorbid Cataracts, Chronic Obstructive Pulmonary Weeks Of Treatment: 4 History: Disease (COPD), Hypertension, Myocardial Clustered Wound: No Infarction Photos Wound Measurements Length: (cm) 0.2 Width: (cm) 0.8 Depth: (cm) 0.3 Area: (cm) 0.126 Volume: (cm) 0.038 % Reduction in Area: 90% % Reduction in Volume: 98.9% Epithelialization: Medium (34-66%) Tunneling: No Undermining: No Wound Description Full Thickness Without Exposed Support Foul Odo Classification: Structures Slough/F Wound Margin: Flat and Intact Exudate Small Amount: Exudate Type: Serous Exudate Color: amber r After Cleansing: No ibrino No Wound Bed Granulation Amount: Large (67-100%) Exposed Structure Granulation Quality: Pink Fascia Exposed: No Necrotic Amount: None Present (0%) Fat Layer (Subcutaneous Tissue) Exposed: Yes Tendon Exposed: No Muscle Exposed: No Joint Exposed: No Bone Exposed: No [name], [name] ([id number]) Treatment Notes Wound #1 (Right Axilla) Notes silvercel, BFD Electronic  Signature(s) Signed: 06/18/2019 8:16:32 AM By: Army Melia Entered By: Army Melia on 06/18/2019 08:16:32

## 2019-06-20 ENCOUNTER — Ambulatory Visit: Payer: Medicare Other | Admitting: Physician Assistant

## 2019-06-20 ENCOUNTER — Encounter: Payer: Medicare Other | Admitting: Physician Assistant

## 2019-06-20 ENCOUNTER — Other Ambulatory Visit: Payer: Self-pay

## 2019-06-20 DIAGNOSIS — L02411 Cutaneous abscess of right axilla: Secondary | ICD-10-CM | POA: Diagnosis not present

## 2019-06-22 NOTE — Progress Notes (Signed)
[name] ([id number]) Visit Report for 06/20/2019 Chief Complaint Document Details Patient Name: [name], [name] Date of Service: 06/20/2019 3:15 PM Medical Record Number: [id number] Patient Account Number: [id number] Date of Birth/Sex: [birth year] (64 y.o. F) Treating RN: Curtis Sites Primary Care Provider: PATIENT, NO Other Clinician: Referring Provider: Sung Amabile Treating Provider/Extender: Linwood Dibbles, Leonie Amacher [name] in Treatment: 5 Information Obtained from: Patient Chief Complaint Right Axilla abscess and ulcer Electronic Signature(s) Signed: 06/20/2019 3:43:21 PM By: Lenda Kelp PA-C Entered By: Lenda Kelp on 06/20/2019 15:43:21 [name] ([id number]) -------------------------------------------------------------------------------- HPI Details Patient Name: [name] Date of Service: 06/20/2019 3:15 PM Medical Record Number: [id number] Patient Account Number: [id number] Date of Birth/Sex: 06/27/1955 (64 y.o. F) Treating RN: Curtis Sites Primary Care Provider: PATIENT, NO Other Clinician: Referring Provider: Sung Amabile Treating Provider/Extender: Linwood Dibbles, Zac Torti [name] in Treatment: 5 History of Present Illness HPI Description: 05/16/2019 patient presents today for initial evaluation in our clinic concerning a abscess she had in the right axillary region. This was subsequently evaluated by Dr. Tonna Boehringer in the hospital and he did have to open this up in order to drain the abscess. Subsequent to that it was recommended that the patient have this packed on a regular basis with saline wet-to- dry dressings. Unfortunately the patient does not have anyone who can really help her with this. Only her son is apparently in the picture and she states that he would "pass out" if he were to have to do this. Nonetheless she has been in a very tight spot she actually is gone back to the hospital to have this changed at one point and then subsequently they  recommended she go back to the surgeon Dr. Tonna Boehringer actually referred her to [place] to help manage the patient's wound. With that being said currently she states that she does not have any discomfort except for if the area is being packed with the packing is removed. She does have a history of hypertension but currently states that that is typically under pretty good control. With regard to the wound itself I do believe that this is something that is going to need to be packed on a more regular basis. This may and tell us having to do this some here in the clinic and then potentially getting her home health as well if were able to do so. I do think it is good to be of utmost importance however to keep this packing changed if at all possible daily although weekends are good to be difficult no matter what. No fevers, chills, nausea, vomiting, or diarrhea. 05/23/2019 upon evaluation today patient actually appears to be doing excellent in regard to her wound. She has been tolerating the dressing changes with the silver alginate without complication. Fortunately I am very pleased with the progress she is made this seems to be measuring better and in just 1 week's time is much cleaner she also tells me is not hurting like it was previous which is also excellent news. All in all I do not see anything that really has me concerned at this point which is great news as well. 05/30/2019 on evaluation today patient appears to be doing very well with regard to her ulcer. She has been tolerating the dressing changes without complication. This is measuring much better as far as the overall size is also measuring better as far as depth. Overall I feel like we are seeing good improvement. Potentially we could consider a snap VAC that may speed things up  even better for her. With that being said I am unsure whether her insurance would cover this. Nonetheless we can definitely continue with where things are now as far as  treatment is concerned. 06/06/2019 upon evaluation today patient appears to be doing well with regard to her wound in the right axillary region. She has been tolerating the dressing changes without complication. Fortunately there is no signs of active infection at this time. No fevers, chills, nausea, vomiting, or diarrhea. With that being said she does have something that we did notice to close and then at a visit she has a small what appears to be hair bump that is inferior to the wound. Nonetheless I do believe that she is going to require at this point potentially some antibiotics to try to help out in this regard. I do not want this to turn into anything more significant like what she had before. 06/13/2019 on evaluation today patient appears to be doing well with regard to her axillary ulcer. She has been tolerating the dressing changes without complication. Fortunately there is no signs of active infection at this time which is great news. No fevers, chills, nausea, vomiting, or diarrhea. 06/20/2019 on evaluation today patient actually appears to be doing excellent in fact she appears to be completely healed based on what I am seeing at this point. Fortunately there is no signs of active infection at this time. Electronic Signature(s) Signed: 06/20/2019 3:44:01 PM By: Lenda Kelp PA-C Entered By: Lenda Kelp on 06/20/2019 15:44:01 [name] ([id number]) -------------------------------------------------------------------------------- Physical Exam Details Patient Name: [name] Date of Service: 06/20/2019 3:15 PM Medical Record Number: [id number] Patient Account Number: [id number] Date of Birth/Sex: 13-Aug-1955 (64 y.o. F) Treating RN: Curtis Sites Primary Care Provider: PATIENT, NO Other Clinician: Referring Provider: Sung Amabile Treating Provider/Extender: STONE III, Taeden Geller [name] in Treatment: 5 Constitutional Well-nourished and well-hydrated in no acute  distress. Respiratory normal breathing without difficulty. Psychiatric this patient is able to make decisions and demonstrates good insight into disease process. Alert and Oriented x 3. pleasant and cooperative. Notes Patient's wound bed again showed signs of complete epithelization she does have an intention where the wound healed epithelializing down the sidewalls but nonetheless it does appear to have done excellent I feel like she is completely resolved at this point as far as closing of this wound is concerned. Electronic Signature(s) Signed: 06/20/2019 3:44:23 PM By: Lenda Kelp PA-C Entered By: Lenda Kelp on 06/20/2019 15:44:22 Pesch, [name] ([id number]) -------------------------------------------------------------------------------- Physician Orders Details Patient Name: [name] Date of Service: 06/20/2019 3:15 PM Medical Record Number: [id number] Patient Account Number: [id number] Date of Birth/Sex: May 07, 1955 (64 y.o. F) Treating RN: Curtis Sites Primary Care Provider: PATIENT, NO Other Clinician: Referring Provider: Sung Amabile Treating Provider/Extender: Linwood Dibbles, Obaloluwa Delatte [name] in Treatment: 5 Verbal / Phone Orders: No Diagnosis Coding Discharge From [facility] o Discharge from [facility] Electronic Signature(s) Signed: 06/20/2019 4:25:46 PM By: Curtis Sites Signed: 06/22/2019 11:36:00 PM By: Lenda Kelp PA-C Entered By: Curtis Sites on 06/20/2019 15:21:16 Nees, [name] ([id number]) -------------------------------------------------------------------------------- Problem List Details Patient Name: [name] Date of Service: 06/20/2019 3:15 PM Medical Record Number: [id number] Patient Account Number: [id number] Date of Birth/Sex: 1955-05-18 (64 y.o. 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 F) Treating RN: Curtis Sites Primary Care Provider: PATIENT, NO Other Clinician: Referring Provider: Sung Amabile Treating Provider/Extender: Linwood Dibbles, Broxton Broady [name] in Treatment: 5 Subjective Chief Complaint Information obtained from Patient Right Axilla abscess and ulcer History of Present Illness (HPI) 05/16/2019 patient presents today for initial evaluation in our clinic concerning a abscess she had in the right axillary region. This was subsequently evaluated by Dr. Tonna Boehringer in the hospital and he did have to open this up in order to drain the abscess. Subsequent to that it was recommended that the patient have this packed on a regular basis with saline wet-to-dry dressings. Unfortunately the patient does not have anyone who can really help her with this. Only her son is apparently in the picture and she states that he would "pass out" if he were to have to do this. Nonetheless she has been in a very tight spot she actually is gone back to the hospital to have this changed at one point and then subsequently they recommended she go back to the surgeon Dr. Tonna Boehringer actually referred her to [place] to help manage  the patient's wound. With that being said currently she states that she does not have any discomfort except for if the area is being packed with the packing is removed. She does have a history of hypertension but currently states that that is typically under pretty good control. With regard to the wound itself I do believe that this is something that is going to need to be packed on a more regular basis. This may and tell us having to do this some here in the clinic and then potentially getting her home health as well if were able to do so. I do think it is good to be of utmost importance however to keep this packing changed if at all possible daily although weekends are good to be difficult no matter what. No fevers, chills, nausea, vomiting, or diarrhea. 05/23/2019 upon evaluation today patient actually appears to be doing excellent in regard to her wound. She has been tolerating the dressing changes with the silver alginate without complication. Fortunately I am very pleased with the progress she is made this seems to be measuring better and in just 1 week's time is much cleaner she also tells me is not hurting like it was previous which is also excellent news. All in all I do not see anything that really has me concerned at this point which is great news as well. 05/30/2019 on evaluation today patient appears to be doing very well with regard to her ulcer. She has been tolerating the dressing changes without complication. This is measuring much better as far as the overall size is also measuring better as far as depth. Overall I feel like we are seeing good improvement. Potentially we could consider a snap VAC that may speed things up even better for her. With that being said I am unsure whether her insurance would cover this. Nonetheless we can definitely continue with where things are now as far as treatment is concerned. 06/06/2019  upon evaluation today patient appears to be doing well with  regard to her wound in the right axillary region. She has been tolerating the dressing changes without complication. Fortunately there is no signs of active infection at this time. No fevers, chills, nausea, vomiting, or diarrhea. With that being said she does have something that we did notice to close and then at a visit she has a small what appears to be hair bump that is inferior to the wound. Nonetheless I do believe that she is going to require at this point potentially some antibiotics to try to help out in this regard. I do not want this to turn into anything more significant like what she had before. 06/13/2019 on evaluation today patient appears to be doing well with regard to her axillary ulcer. She has been tolerating the dressing changes without complication. Fortunately there is no signs of active infection at this time which is great news. No fevers, chills, nausea, vomiting, or diarrhea. 06/20/2019 on evaluation today patient actually appears to be doing excellent in fact she appears to be completely healed based on what I am seeing at this point. Fortunately there is no signs of active infection at this time. [name], [name] ([id number]) Objective Constitutional Well-nourished and well-hydrated in no acute distress. Vitals Time Taken: 3:12 PM, Height: 64 in, Weight: 190 lbs, BMI: 32.6, Temperature: 97.4 F, Pulse: 72 bpm, Respiratory Rate: 16 breaths/min, Blood Pressure: 119/51 mmHg. Respiratory normal breathing without difficulty. Psychiatric this patient is able to make decisions and demonstrates good insight into disease process. Alert and Oriented x 3. pleasant and cooperative. General Notes: Patient's wound bed again showed signs of complete epithelization she does have an intention where the wound healed epithelializing down the sidewalls but nonetheless it does appear to have done excellent I feel like she is completely resolved at this point as far as closing of this  wound is concerned. Integumentary (Hair, Skin) Wound #1 status is Healed - Epithelialized. Original cause of wound was Gradually Appeared. The wound is located on the Right Axilla. The wound measures 0cm length x 0cm width x 0cm depth; 0cm^2 area and 0cm^3 volume. Assessment Active Problems ICD-10 Cutaneous abscess of right axilla Non-pressure chronic ulcer of skin of other sites with fat layer exposed Essential (primary) hypertension Plan Discharge From [facility] Services: [facility]charge from [facility] [place] ([id number]) 1. My suggestion at this time is good to be that we go ahead and discontinue wound care services at the patient appears to be completely healed. 2. We will put a protective dressing over this to keep on for the next few days she can then remove this and then should be able to shower and otherwise function normally at that point. I do not see any reason that she will need to be limited following. We will see her back for follow-up visit as needed. Electronic Signature(s) Signed: 06/20/2019 3:44:55 PM By: Worthy Keeler PA-C Entered By: Worthy Keeler on 06/20/2019 15:44:55 [name], [name] ([id number]) -------------------------------------------------------------------------------- SuperBill Details Patient Name: [name] Date of Service: 06/20/2019 Medical Record Number: [id number] Patient Account Number: [id number] Date of Birth/Sex: 12-20-1955 (64 y.o. F) Treating RN: Montey Hora Primary Care Provider: PATIENT, NO Other Clinician: Referring Provider: Benjamine Sprague Treating Provider/Extender: Melburn Hake, Ysidro Ramsay [name] in Treatment: 5 Diagnosis Coding ICD-10 Codes Code Description L02.411 Cutaneous abscess of right axilla L98.492 Non-pressure chronic ulcer of skin of other sites with fat layer exposed I10 Essential (primary) hypertension Facility Procedures CPT4 Code: 76546503 Description:  23557 - WOUND CARE VISIT-LEV 2 EST  PT Modifier: Quantity: 1 Physician Procedures CPT4 Code: 3220254 Description: 99213 - WC PHYS LEVEL 3 - EST PT ICD-10 Diagnosis Description L02.411 Cutaneous abscess of right axilla L98.492 Non-pressure chronic ulcer of skin of other sites with fat l I10 Essential (primary) hypertension Modifier: ayer exposed Quantity: 1 Electronic Signature(s) Signed: 06/20/2019 3:45:06 PM By: Lenda Kelp PA-C Entered By: Lenda Kelp on 06/20/2019 15:45:06

## 2019-06-27 ENCOUNTER — Ambulatory Visit: Payer: Medicare Other | Admitting: Physician Assistant

## 2019-07-04 NOTE — Progress Notes (Signed)
TEQUITA, MARRS ([id number]) Visit Report for 06/20/2019 Arrival Information Details Patient Name: [name], [name] [name] Date of Service: 06/20/2019 3:15 PM Medical Record Number: [id number] Patient Account Number: [id number] Date of Birth/Sex: [birth year] (64 y.o. F) Treating RN: [name] [name] [name] Primary Care [name] [name] [name]: PATIENT, NO Other Clinician: Referring [name] [name] [name]: [name] [name] [name] Treating [name] [name] [name]/Extender: [name] [name] [name], [name] [name] [name] in Treatment: 5 Visit Information History Since Last Visit Added or deleted any medications: No Patient Arrived: Ambulatory Any new allergies or adverse reactions: No Arrival Time: 15:12 Had a fall or experienced change in No Accompanied By: self activities of daily living that may affect Transfer Assistance: None risk of falls: Patient Identification Verified: Yes Signs or symptoms of abuse/neglect since last visito No Secondary Verification Process Completed: Yes Hospitalized since last visit: No Implantable device outside of the clinic excluding No cellular tissue based products placed in the center since last visit: Has Dressing in Place as Prescribed: Yes Pain Present Now: No Electronic Signature(s) Signed: 06/20/2019 4:27:22 PM By: [name] [name] [name] RCP, RRT, CHT Entered By: [name] [name] [name] on 06/20/2019 15:13:54 [name] [name] [name] Primas ([id number]) -------------------------------------------------------------------------------- Clinic Level of Care Assessment Details Patient Name: [name] [name], [name] Primas Date of Service: 06/20/2019 3:15 PM Medical Record Number: [id number] Patient Account Number: [id number] Date of Birth/Sex: 05-28-1955 (64 y.o. F) Treating RN: [name] [name] [name] Primary Care [name] [name] [name]: PATIENT, NO Other Clinician: Referring [name] [name] [name]: [name] [name] [name] Treating [name] [name] [name]/Extender: [name] [name] [name], [name] [name] [name] in Treatment: 5 Clinic Level of Care Assessment Items TOOL 4 Quantity Score []  - Use when only an EandM is performed on  FOLLOW-UP visit 0 ASSESSMENTS - Nursing Assessment / Reassessment X - Reassessment of Co-morbidities (includes updates in patient status) 1 10 X- 1 5 Reassessment of Adherence to Treatment Plan ASSESSMENTS - Wound and Skin Assessment / Reassessment X - Simple Wound Assessment / Reassessment - one wound 1 5 []  - 0 Complex Wound Assessment / Reassessment - multiple wounds []  - 0 Dermatologic / Skin Assessment (not related to wound area) ASSESSMENTS - Focused Assessment []  - Circumferential Edema Measurements - multi extremities 0 []  - 0 Nutritional Assessment / Counseling / Intervention []  - 0 Lower Extremity Assessment (monofilament, tuning fork, pulses) []  - 0 Peripheral Arterial Disease Assessment (using hand held doppler) ASSESSMENTS - Ostomy and/or Continence Assessment and Care []  - Incontinence Assessment and Management 0 []  - 0 Ostomy Care Assessment and Management (repouching, etc.) PROCESS - Coordination of Care X - Simple Patient / Family Education for ongoing care 1 15 []  - 0 Complex (extensive) Patient / Family Education for ongoing care X- 1 10 Staff obtains Programmer, systems, Records, Test Results / Process Orders []  - 0 Staff telephones HHA, Nursing Homes / Clarify orders / etc []  - 0 Routine Transfer to another Facility (non-emergent condition) []  - 0 Routine Hospital Admission (non-emergent condition) []  - 0 New Admissions / Biomedical engineer / Ordering NPWT, Apligraf, etc. []  - 0 Emergency Hospital Admission (emergent condition) X- 1 10 Simple Discharge Coordination []  - 0 Complex (extensive) Discharge Coordination PROCESS - Special Needs []  - Pediatric / Minor Patient Management 0 []  - 0 Isolation Patient Management []  - 0 Hearing / Language / Visual special needs []  - 0 Assessment of Community assistance (transportation, D/C planning, etc.) [name] [name], [name] ([id number]) []  - 0 Additional assistance / Altered mentation []  - 0 Support Surface(s)  Assessment (bed, cushion, seat, etc.) INTERVENTIONS - Wound Cleansing / Measurement X - Simple Wound Cleansing - one wound 1 5 []  - 0 Complex Wound Cleansing - multiple wounds X- 1 5 Wound Imaging (  photographs - any number of wounds) []  - 0 Wound Tracing (instead of photographs) X- 1 5 Simple Wound Measurement - one wound []  - 0 Complex Wound Measurement - multiple wounds INTERVENTIONS - Wound Dressings []  - Small Wound Dressing one or multiple wounds 0 []  - 0 Medium Wound Dressing one or multiple wounds []  - 0 Large Wound Dressing one or multiple wounds []  - 0 Application of Medications - topical []  - 0 Application of Medications - injection INTERVENTIONS - Miscellaneous []  - External ear exam 0 []  - 0 Specimen Collection (cultures, biopsies, blood, body fluids, etc.) []  - 0 Specimen(s) / Culture(s) sent or taken to Lab for analysis []  - 0 Patient Transfer (multiple staff / / Similar devices) []  - 0 Simple Staple / Suture removal (25 or less) []  - 0 Complex Staple / Suture removal (26 or more) []  - 0 Hypo / Hyperglycemic Management (close monitor of Blood Glucose) []  - 0 Ankle / Brachial Index (ABI) - do not check if billed separately X- 1 5 Vital Signs Has the patient been seen at the hospital within the last three years: Yes Total Score: 75 Level Of Care: New/Established - Level 2 Electronic Signature(s) Signed: 06/20/2019 4:25:46 PM By: Entered By: on 06/20/2019 15:39:42 Symmonds, ( ) -------------------------------------------------------------------------------- Encounter Discharge Information Details Patient Name: Date of Service: 06/20/2019 3:15 PM Medical Record Number: Patient Account Number: Date of Birth/Sex: 10/31/1955 (63 y.o. F) Treating RN: Primary Care [name] [name] [name]: PATIENT, NO Other Clinician: Referring [name] [name] [name]: Treating  [name] [name] [name]/Extender: , [name] [name] [name] in Treatment: 5 Encounter Discharge Information Items Discharge Condition: Stable Ambulatory Status: Ambulatory Discharge Destination: Home Transportation: Private Auto Accompanied By: self Schedule Follow-up Appointment: No Clinical Summary of Care: Electronic Signature(s) Signed: 06/20/2019 3:40:22 PM By: Curtis Sites Entered By: Curtis Sites on 06/20/2019 15:40:22 [name] [name], [name] Counter ([id number]) -------------------------------------------------------------------------------- Lower Extremity Assessment Details Patient Name: [name] [name] [name] Date of Service: 06/20/2019 3:15 PM Medical Record Number: [id number] Patient Account Number: [id number] Date of Birth/Sex: 26-Jun-1955 (63 y.o. F) Treating RN: Curtis Sites Primary Care Faye Sanfilippo: PATIENT, NO Other Clinician: Referring Dontrail Blackwell: Sung Amabile Treating Galit Urich/Extender: Linwood Dibbles in Treatment: 5 Electronic Signature(s) Signed: 07/04/2019 5:40:44 PM By: Curtis Sites, BSN, RN, CWS, Kim RN, BSN Entered By: Curtis Sites, BSN, RN, CWS, Kim on 06/20/2019 15:18:38 [name] [name], [name] Counter ([id number]) -------------------------------------------------------------------------------- Multi-Disciplinary Care Plan Details Patient Name: [name] [name] [name] Date of Service: 06/20/2019 3:15 PM Medical Record Number: [id number] Patient Account Number: [id number] Date of Birth/Sex: 1955/07/09 (63 y.o. F) Treating RN: Huel Coventry Primary Care Chesley Valls: PATIENT, NO Other Clinician: Referring Zendaya Groseclose: Sung Amabile Treating Kamauri Denardo/Extender: Skeet Simmer, [name] [name] [name] in Treatment: 5 Active Inactive Electronic Signature(s) Signed: 06/20/2019 3:39:00 PM By: Elliot Gurney Entered By: Elliot Gurney on 06/20/2019 15:38:59 [name] [name], [name] Counter ([id number]) -------------------------------------------------------------------------------- Pain Assessment Details Patient Name: [name] [name] [name] Date of Service:  06/20/2019 3:15 PM Medical Record Number: [id number] Patient Account Number: [id number] Date of Birth/Sex: [birth year] (63 y.o. F) Treating RN: Curtis Sites Primary Care Marrian Bells: PATIENT, NO Other Clinician: Referring Terresa Marlett: Sung Amabile Treating Lawanna Cecere/Extender: Linwood Dibbles, [name] [name] [name] in Treatment: 5 Active Problems Location of Pain Severity and Description of Pain Patient Has Paino No Site Locations Pain Management and Medication Current Pain Management: Electronic Signature(s) Signed: 07/04/2019 5:40:44 PM By: Curtis Sites, BSN, RN, CWS, Kim RN, BSN Entered By: Curtis Sites, BSN, RN, CWS, Kim on 06/20/2019 15:18:16 [name] [name], [name] Counter ([id number]) -------------------------------------------------------------------------------- Patient/Caregiver Education Details Patient Name: [name] [name] [name] Date of Service: 06/20/2019 3:15 PM  Medical Record Number: [id number] Patient Account Number: [id number] Date of Birth/Gender: [birth year] (64 y.o. F) Treating RN: Curtis Sites Primary Care Physician: PATIENT, NO Other Clinician: Referring Physician: Sung Amabile Treating Physician/Extender: Skeet Simmer in Treatment: 5 Education Assessment Education Provided To: Patient Education Topics Provided Basic Hygiene: Handouts: Other: skin care Methods: Explain/Verbal Responses: State content correctly Electronic Signature(s) Signed: 06/20/2019 4:25:46 PM By: Curtis Sites Entered By: Curtis Sites on 06/20/2019 15:40:04 [name] [name], [name] Counter ([id number]) -------------------------------------------------------------------------------- Wound Assessment Details Patient Name: [name] [name] [name] Date of Service: 06/20/2019 3:15 PM Medical Record Number: [id number] Patient Account Number: [id number] Date of Birth/Sex: 01-28-1956 (63 y.o. F) Treating RN: Huel Coventry Primary Care Gelena Klosinski: PATIENT, NO Other Clinician: Referring Marka Treloar: Sung Amabile Treating Jaziah Kwasnik/Extender: STONE III, [name] [name] [name]  in Treatment: 5 Wound Status Wound Number: 1 Primary Etiology: Abscess Wound Location: Right Axilla Wound Status: Healed - Epithelialized Wounding Event: Gradually Appeared Date Acquired: 05/02/2019 [name] Of Treatment: 5 Clustered Wound: No Photos Photo Uploaded By: Elliot Gurney, BSN, RN, CWS, Kim on 06/20/2019 16:01:50 Wound Measurements Length: (cm) Width: (cm) Depth: (cm) Area: (cm) Volume: (cm) 0 % Reduction in Area: 100% 0 % Reduction in Volume: 100% 0 0 0 Wound Description Classification: Full Thickness Without Exposed Support Structure s Electronic Signature(s) Signed: 07/04/2019 5:40:44 PM By: Elliot Gurney, BSN, RN, CWS, Kim RN, BSN Entered By: Elliot Gurney, BSN, RN, CWS, Kim on 06/20/2019 15:18:31 [name] [name], [name] Counter ([id number]) -------------------------------------------------------------------------------- Vitals Details Patient Name: [name] [name] [name] Date of Service: 06/20/2019 3:15 PM Medical Record Number: [id number] Patient Account Number: [id number] Date of Birth/Sex: 11-08-1955 (63 y.o. F) Treating RN: Curtis Sites Primary Care Juri Dinning: PATIENT, NO Other Clinician: Referring Vishal Sandlin: Sung Amabile Treating Noele Icenhour/Extender: STONE III, [name] [name] [name] in Treatment: 5 Vital Signs Time Taken: 15:12 Temperature (F): 97.4 Height (in): 64 Pulse (bpm): 72 Weight (lbs): 190 Respiratory Rate (breaths/min): 16 Body Mass Index (BMI): 32.6 Blood Pressure (mmHg): 119/51 Reference Range: 80 - 120 mg / dl Electronic Signature(s) Signed: 06/20/2019 4:27:22 PM By: Dayton Martes RCP, RRT, CHT Entered By: Dayton Martes on 06/20/2019 15:15:37

## 2019-08-26 ENCOUNTER — Other Ambulatory Visit: Payer: Self-pay | Admitting: Cardiovascular Disease

## 2019-12-02 ENCOUNTER — Other Ambulatory Visit: Payer: Self-pay | Admitting: Cardiovascular Disease

## 2019-12-19 ENCOUNTER — Other Ambulatory Visit: Payer: Self-pay

## 2019-12-19 ENCOUNTER — Ambulatory Visit: Payer: Medicare Other | Admitting: Cardiovascular Disease

## 2019-12-19 ENCOUNTER — Encounter: Payer: Self-pay | Admitting: Nurse Practitioner

## 2019-12-19 ENCOUNTER — Ambulatory Visit (INDEPENDENT_AMBULATORY_CARE_PROVIDER_SITE_OTHER): Payer: Medicare Other | Admitting: Nurse Practitioner

## 2019-12-19 VITALS — BP 160/96 | HR 60 | Ht 64.0 in | Wt 196.2 lb

## 2019-12-19 DIAGNOSIS — I10 Essential (primary) hypertension: Secondary | ICD-10-CM

## 2019-12-19 DIAGNOSIS — I255 Ischemic cardiomyopathy: Secondary | ICD-10-CM | POA: Diagnosis not present

## 2019-12-19 DIAGNOSIS — Z720 Tobacco use: Secondary | ICD-10-CM

## 2019-12-19 DIAGNOSIS — I2510 Atherosclerotic heart disease of native coronary artery without angina pectoris: Secondary | ICD-10-CM

## 2019-12-19 DIAGNOSIS — E785 Hyperlipidemia, unspecified: Secondary | ICD-10-CM | POA: Diagnosis not present

## 2019-12-19 MED ORDER — LOSARTAN POTASSIUM 100 MG PO TABS: 100.0000 mg | ORAL_TABLET | Freq: Every day | ORAL | 2 refills | Status: DC

## 2019-12-19 NOTE — Progress Notes (Signed)
Office Visit    Patient Name: [name] Date of Encounter: 12/19/2019  Primary Care Provider:  Patient, No Pcp Per Primary Cardiologist:  Lorine Bears, MD  Chief Complaint    64 year old female with a history of CAD status post non-STEMI and drug-eluting stent placement to the LAD in June 2017, hyperlipidemia, mild ischemic cardiomyopathy (EF 50% June 2017), tobacco abuse, and COPD, presents for follow-up of CAD.  Past Medical History    Past Medical History:  Diagnosis Date  . CAD (coronary artery disease)    a. NSTEMI 10/03/15; b. cardiac cath 10/04/15: LM nl, ost-pLAD 90% s/p PCI/DES, mLAD 80% s/p PCI/DES, mRCA 30%, D1,2,3 nl, p-mLCX 30%, OM1 &2 min irregs, OM3 normal, EF 30-35%, sev HK of mid-distal ant, apical, inf wall, mod elevated LVEDP  . Chronic bronchitis (HCC)   . Chronic cough   . COPD (chronic obstructive pulmonary disease) (HCC)   . Dysrhythmia   . GERD (gastroesophageal reflux disease)   . Heart murmur   . HLD (hyperlipidemia)   . Ischemic cardiomyopathy    a. echo 10/05/15: EF 50%, mild HK of anteroseptal and anterior wall, nl LV diastolic fxn  . Myocardial infarction (HCC) 09/2015  . Status post primary angioplasty with coronary stent   . Tobacco abuse   . Wears dentures    full upper and lower   Past Surgical History:  Procedure Laterality Date  . CARDIAC CATHETERIZATION N/A 10/04/2015   Procedure: Left Heart Cath and Coronary Angiography;  Surgeon: Iran Ouch, MD;  Location: [facility] INVASIVE CV [facility];  Service: Cardiovascular;  Laterality: N/A;  . CARDIAC CATHETERIZATION N/A 10/04/2015   Procedure: Coronary Stent Intervention;  Surgeon: Iran Ouch, MD;  Location: [facility] INVASIVE CV LAB;  Service: Cardiovascular;  Laterality: N/A;  . CATARACT EXTRACTION W/PHACO Right 12/18/2018   Procedure: CATARACT EXTRACTION PHACO AND INTRAOCULAR LENS PLACEMENT (IOC)  RIGHT;  Surgeon: Lockie Mola, MD;  Location: [facility];  Service:  Ophthalmology;  Laterality: Right;  . CATARACT EXTRACTION W/PHACO Left 01/08/2019   Procedure: CATARACT EXTRACTION PHACO AND INTRAOCULAR LENS PLACEMENT (IOC) LEFT;  Surgeon: Lockie Mola, MD;  Location: [facility];  Service: Ophthalmology;  Laterality: Left;  leave arrival at 10:30  Total Time: 00:43.0 Total Equivalent Power: 14.4% Cumulative Dissipated Energy: 6.23  . DRESSING CHANGE UNDER ANESTHESIA Right 05/08/2019   Procedure: DRESSING CHANGE UNDER ANESTHESIA;  Surgeon: Sung Amabile, DO;  Location: [facility] OR[facility];  Service: General;  Laterality: Right;  . INCISION AND DRAINAGE ABSCESS Right 05/06/2019   Procedure: INCISION AND DRAINAGE ABSCESS, RIGHT;  Surgeon: Sung Amabile, DO;  Location: [facility] OR[facility];  Service: General;  Laterality: Right;    Allergies  Allergies  Allergen Reactions  . Penicillins Rash    Has patient had a PCN reaction causing immediate rash, facial/tongue/throat swelling, SOB or lightheadedness with hypotension: Yes Has patient had a PCN reaction causing severe rash involving mucus membranes or skin necrosis: No Has patient had a PCN reaction that required hospitalization No Has patient had a PCN reaction occurring within the last 10 years: No If all of the above answers are "NO", then may proceed with Cephalosporin use.    History of Present Illness    64 year old female with a history of CAD status post non-STEMI and drug-eluting stent placement to the LAD (2 overlapping DES) in June 2017, hyperlipidemia, mild ischemic cardiomyopathy (EF 50% in June 2017), tobacco abuse, and COPD.  At the time of her non-STEMI, EF was 30 to 35% on  ventriculogram but follow-up echo showed an EF of 50%.  She was previous evaluated with ABIs in December 2019 which were normal.  She was last seen in cardiology clinic in February following hospitalization in January for cellulitis of the right axilla and breast with evolving abscess.  She was not having any cardiac complaints at that  time.  Since her last visit, she has done reasonably well from a cardiac standpoint.  She has chronic dyspnea on exertion sometimes associated with wheezing, for which she uses albuterol.  She is on Symbicort at baseline.  She has not had any chest pain.  She does have chronic right hip/leg pain and tenderness in the right hip joint and down along the right lateral thigh.  This does not necessarily change with activity.  As noted above, ABIs were normal in 2019.  She continues to smoke a few cigarettes per day.  In that setting, she has chronic cough.  She denies palpitations, PND, orthopnea, dizziness, syncope, edema, or early satiety.  Home Medications    Prior to Admission medications   Medication Sig Start Date End Date Taking? Authorizing Provider  albuterol (VENTOLIN HFA) 108 (90 Base) MCG/ACT inhaler INHALE 2 PUFFS INTO THE LUNGS EVERY 6 HOURS AS NEEDED FOR WHEEZING OR SHORTNESS OF BREATH. 03/06/19   Salena Saner, MD  aspirin 81 MG chewable tablet Chew 1 tablet (81 mg total) by mouth daily. 10/05/15   Auburn Bilberry, MD  carvedilol (COREG) 3.125 MG tablet TAKE 1 TABLET BY MOUTH TWICE A DAY 08/26/19   Iran Ouch, MD  HYDROcodone-acetaminophen (NORCO) 5-325 MG tablet Take 1 tablet by mouth every 6 (six) hours as needed for up to 12 doses for moderate pain or severe pain. 05/08/19   Dhungel, Theda Belfast, MD  ibuprofen (ADVIL) 800 MG tablet Take 1 tablet (800 mg total) by mouth every 8 (eight) hours as needed for mild pain or moderate pain. 05/08/19   Tonna Boehringer, Isami, DO  losartan (COZAAR) 50 MG tablet TAKE 1 TABLET BY MOUTH ONCE DAILY 12/02/19   Iran Ouch, MD  omeprazole (PRILOSEC) 20 MG capsule TAKE 1 CAPSULE BY MOUTH TWICE DAILY BEFORE MEALS 08/26/19   Iran Ouch, MD  PRALUENT 75 MG/ML SOAJ INJECT 75 MG INTO THE SKIN EVERY 14 DAYSAS DIRECTED 12/02/19   Iran Ouch, MD  rosuvastatin (CRESTOR) 40 MG tablet TAKE 1 TABLET BY MOUTH ONCE A DAY 12/02/19   Iran Ouch, MD   sulfamethoxazole-trimethoprim (BACTRIM DS) 800-160 MG tablet Take 1 tablet by mouth 2 (two) times daily. 05/08/19   Dhungel, Theda Belfast, MD  SYMBICORT 160-4.5 MCG/ACT inhaler INHALE 1 PUFF INTO THE LUNGS 2 TIMES DAILY. RINSE MOUTH AFTER USE. 03/06/19   Salena Saner, MD    Review of Systems    Chronic dyspnea on exertion and right hip/leg pain and tenderness.  She denies chest pain, palpitations, PND, orthopnea, dizziness, syncope, edema, or early satiety.  All other systems reviewed and are otherwise negative except as noted above.  Physical Exam    VS:  BP (!) 160/96   Pulse 60   Ht 5\' 4"  (1.626 m)   Wt 196 lb 3.2 oz (89 kg)   SpO2 96%   BMI 33.68 kg/m  , BMI Body mass index is 33.68 kg/m. GEN: Well nourished, well developed, in no acute distress. HEENT: normal. Neck: Supple, no JVD, carotid bruits, or masses. Cardiac: RRR, distant heart sounds.  No murmurs, rubs, or gallops. No clubbing, cyanosis, edema.  Radials/PT 2+  and equal bilaterally.  Respiratory:  Respirations regular and unlabored, diminished breath sounds with scattered rhonchi bilaterally. GI: Soft, nontender, nondistended, BS + x 4. MS: no deformity or atrophy. Skin: warm and dry, no rash. Neuro:  Strength and sensation are intact. Psych: Normal affect.  Accessory Clinical Findings    ECG personally reviewed by me today -regular sinus rhythm, 60 - no acute changes.  Lab Results  Component Value Date   WBC 11.0 (H) 05/08/2019   HGB 9.7 (L) 05/08/2019   HCT 30.3 (L) 05/08/2019   MCV 90.2 05/08/2019   PLT 316 05/08/2019   Lab Results  Component Value Date   CREATININE 0.72 05/08/2019   BUN 15 05/08/2019   NA 139 05/08/2019   K 4.0 05/08/2019   CL 104 05/08/2019   CO2 24 05/08/2019   Lab Results  Component Value Date   ALT 19 05/05/2019   AST 23 05/05/2019   ALKPHOS 72 05/05/2019   BILITOT 0.4 05/05/2019   Lab Results  Component Value Date   CHOL 148 06/13/2019   HDL 78 06/13/2019   LDLCALC 53  06/13/2019   TRIG 95 06/13/2019   CHOLHDL 1.9 06/13/2019    Lab Results  Component Value Date   HGBA1C 5.6 05/07/2019    Assessment & Plan    1.  Coronary artery disease: Status post non-STEMI and PCI/drug-eluting stent placement to the LAD in 2017.  She has not been having any chest pain.  She has chronic dyspnea on exertion in the setting of COPD and ongoing tobacco abuse, which has been stable.  She remains on aspirin, beta-blocker, ARB, and statin therapy.  Titrating losartan in the setting of hypertension.  2.  Essential hypertension: Blood pressure elevated today.  She does not check at home.  Increase in losartan 100 mg daily.  Follow-up basic metabolic panel next week.  3.  Hyperlipidemia: She remains on high potency Crestor therapy and Praluent with an LDL of 53 in February.  LFTs were normal in January.  4.  Tobacco abuse/COPD: Continues to smoke a few cigarettes a day.  Complete cessation advised.  5.  Ischemic cardiomyopathy: EF initially 30 to 35% by ventriculogram at the time of her non-STEMI but then improved to 50% on echo.  Euvolemic on examination.  She continues on beta-blocker and ARB therapy.  6.  Disposition: Follow-up basic metabolic panel next week.  Follow-up in clinic in 6 months or sooner if necessary.   Nicolasa Ducking, NP 12/19/2019, 1:07 PM

## 2019-12-19 NOTE — Patient Instructions (Signed)
Medication Instructions:  Your physician has recommended you make the following change in your medication:  INCREASE Losartan to 100 mg daily. An Rx has been sent to your pharmacy.  *If you need a refill on your cardiac medications before your next appointment, please call your pharmacy*   Lab Work: Your physician recommends that you return for lab work in: in 1 week.  Please arrange transportation for follow up lab work on 12/26/19. Lab to be drawn at the [facility] medical mall. No appointment required lab hours are 8am-5pm.    If you have labs (blood work) drawn today and your tests are completely normal, you will receive your results only by: [name] MyChart Message (if you have MyChart) OR . A paper copy in the mail If you have any lab test that is abnormal or we need to change your treatment, we will call you to review the results.   Testing/Procedures: N/A   Follow-Up: At [facility], you and your health needs are our priority.  As part of our continuing mission to provide you with exceptional heart care, we have created designated Provider Care Teams.  These Care Teams include your primary Cardiologist (physician) and Advanced Practice Providers (APPs -  Physician Assistants and Nurse Practitioners) who all work together to provide you with the care you need, when you need it.  We recommend signing up for the patient portal called "MyChart".  Sign up information is provided on this After Visit Summary.  MyChart is used to connect with patients for Virtual Visits (Telemedicine).  Patients are able to view lab/test results, encounter notes, upcoming appointments, etc.  Non-urgent messages can be sent to your provider as well.   To learn more about what you can do with MyChart, go to [email or web address].    Your next appointment:   6 month(s)  The format for your next appointment:   In Person  Provider:    You may see Lorine Bears, MD or one of the following Advanced Practice  Providers on your designated Care Team:    Nicolasa Ducking, NP  Eula Listen, PA-C  Marisue Ivan, PA-C    Other Instructions N/A

## 2019-12-24 ENCOUNTER — Other Ambulatory Visit: Payer: Self-pay | Admitting: Cardiovascular Disease

## 2019-12-26 ENCOUNTER — Other Ambulatory Visit
Admission: RE | Admit: 2019-12-26 | Discharge: 2019-12-26 | Disposition: A | Discharge status: Home / Self Care | Payer: Medicare Other | Attending: Nurse Practitioner | Admitting: Nurse Practitioner

## 2019-12-26 ENCOUNTER — Other Ambulatory Visit: Payer: Self-pay

## 2019-12-26 DIAGNOSIS — I2510 Atherosclerotic heart disease of native coronary artery without angina pectoris: Secondary | ICD-10-CM | POA: Diagnosis present

## 2019-12-26 LAB — BASIC METABOLIC PANEL
Anion gap: 10 (ref 5–15)
BUN: 23 mg/dL (ref 8–23)
CO2: 28 mmol/L (ref 22–32)
Calcium: 8.9 mg/dL (ref 8.9–10.3)
Chloride: 104 mmol/L (ref 98–111)
Creatinine, Ser: 0.95 mg/dL (ref 0.44–1.00)
GFR calc Af Amer: >60 mL/min (ref >60)
GFR calc non Af Amer: >60 mL/min (ref >60)
Glucose, Bld: 98 mg/dL (ref 70–99)
Potassium: 4 mmol/L (ref 3.5–5.1)
Sodium: 142 mmol/L (ref 135–145)

## 2020-01-20 ENCOUNTER — Telehealth: Payer: Self-pay | Admitting: Cardiovascular Disease

## 2020-01-20 NOTE — Telephone Encounter (Signed)
Patient lab results were previously released to mychart.  Patient made aware of  12/26/19 lab results.   Per Ward Givens, NP Renal fxn, lytes wnl.  Patient voiced appreciation for the call back.

## 2020-01-20 NOTE — Telephone Encounter (Signed)
Patient calling in for blood work results from last month

## 2020-04-21 ENCOUNTER — Telehealth: Payer: Self-pay | Admitting: *Deleted

## 2020-04-21 NOTE — Telephone Encounter (Signed)
Pt requiring PA for Praluent 75 mg/ml. PA has been faxed to [facility]. Awaiting Approval.

## 2020-04-21 NOTE — Telephone Encounter (Signed)
Per fax received from [facility], Praluent has been approved effective1/05/2020 through 04/30/2021 under patient's Medicare Part D benefit.

## 2020-05-03 ENCOUNTER — Telehealth: Payer: Self-pay

## 2020-05-03 NOTE — Telephone Encounter (Signed)
Per fax received from [facility], Praluent has been approved effective1/05/2020 through 04/30/2021 under patient's Medicare Part D benefit

## 2020-05-10 ENCOUNTER — Other Ambulatory Visit: Payer: Self-pay | Admitting: Cardiovascular Disease

## 2020-05-10 NOTE — Telephone Encounter (Signed)
Rx request sent to pharmacy.  

## 2020-07-06 ENCOUNTER — Ambulatory Visit: Payer: Medicare Other | Admitting: Cardiovascular Disease

## 2020-07-06 ENCOUNTER — Other Ambulatory Visit: Payer: Self-pay | Admitting: Cardiovascular Disease

## 2020-07-15 ENCOUNTER — Other Ambulatory Visit: Payer: Self-pay

## 2020-07-15 ENCOUNTER — Encounter: Payer: Self-pay | Admitting: Family

## 2020-07-15 ENCOUNTER — Ambulatory Visit (INDEPENDENT_AMBULATORY_CARE_PROVIDER_SITE_OTHER): Payer: Medicare Other | Admitting: Family

## 2020-07-15 VITALS — BP 142/80 | HR 65 | Ht 64.0 in | Wt 193.0 lb

## 2020-07-15 DIAGNOSIS — I2510 Atherosclerotic heart disease of native coronary artery without angina pectoris: Secondary | ICD-10-CM

## 2020-07-15 DIAGNOSIS — I10 Essential (primary) hypertension: Secondary | ICD-10-CM

## 2020-07-15 DIAGNOSIS — E782 Mixed hyperlipidemia: Secondary | ICD-10-CM

## 2020-07-15 DIAGNOSIS — Z720 Tobacco use: Secondary | ICD-10-CM

## 2020-07-15 DIAGNOSIS — E785 Hyperlipidemia, unspecified: Secondary | ICD-10-CM

## 2020-07-15 DIAGNOSIS — I255 Ischemic cardiomyopathy: Secondary | ICD-10-CM | POA: Diagnosis not present

## 2020-07-15 MED ORDER — AMLODIPINE BESYLATE 5 MG PO TABS: 5.0000 mg | ORAL_TABLET | Freq: Every day | ORAL | 1 refills | Status: DC

## 2020-07-15 NOTE — Patient Instructions (Addendum)
Medication Instructions:  Your physician has recommended you make the following change in your medication:   START Amlodipine 5mg    *If you need a refill on your cardiac medications before your next appointment, please call your pharmacy*  Lab Work: Your provider recommends lab work at within the next week: CMP, lipid panel, direct LDL. Simply stop by the [facility] anytime 8am - 5pm.  If you have labs (blood work) drawn today and your tests are completely normal, you will receive your results only by:  MyChart Message (if you have MyChart) OR  A paper copy in the mail If you have any lab test that is abnormal or we need to change your treatment, we will call you to review the results.   Testing/Procedures: Your EKG today showed normal sinus rhythm which is a good result!  Follow-Up: At [facility], you and your health needs are our priority.  As part of our continuing mission to provide you with exceptional heart care, we have created designated Provider Care Teams.  These Care Teams include your primary Cardiologist (physician) and Advanced Practice Providers (APPs -  Physician Assistants and Nurse Practitioners) who all work together to provide you with the care you need, when you need it.  We recommend signing up for the patient portal called "MyChart".  Sign up information is provided on this After Visit Summary.  MyChart is used to connect with patients for Virtual Visits (Telemedicine).  Patients are able to view lab/test results, encounter notes, upcoming appointments, etc.  Non-urgent messages can be sent to your provider as well.   To learn more about what you can do with MyChart, go to [facility].    Your next appointment:   6 month(s)  The format for your next appointment:   In Person  Provider:   You may see [email or web address], MD or one of the following Advanced Practice Providers on your designated Care Team:    Lorine Bears,  NP  Nicolasa Ducking, PA-C  Eula Listen, PA-C  Cadence Marisue Ivan, Fransico Michael  [place], NP  Other Instructions  Heart Healthy Diet Recommendations: A low-salt diet is recommended. Meats should be grilled, baked, or boiled. Avoid fried foods. Focus on lean protein sources like fish or chicken with vegetables and fruits. The American Heart Association is a Gillian Shields!  American Heart Association Diet and Lifeystyle Recommendations   Exercise recommendations: The American Heart Association recommends 150 minutes of moderate intensity exercise weekly. Try 30 minutes of moderate intensity exercise 4-5 times per week. This could include walking, jogging, or swimming.   Recommend establishing with a primary care provider.  o You may call [facility] @ [phone] for a list of primary care providers in your area or visit their website [phone] o Please have any insurance card available before calling or going online.  o [email or web address] at Adult nurse and at [facility] are both wonderful offices!

## 2020-07-15 NOTE — Progress Notes (Signed)
Office Visit    Patient Name: [name] Date of Encounter: 07/15/2020  PCP:  Patient, No Pcp Per   [facility]  Cardiologist:  Kathlyn [place], MD  Advanced Practice Provider:  No care team member to display Electrophysiologist:  None  } Chief Complaint    [name] is a 65 y.o. female with a hx of coronary artery disease, ischemic cardiomyopathy, hypertension, COPD, hyperlipidemia presents today for follow-up of coronary artery disease, hypertension  Past Medical History    Past Medical History:  Diagnosis Date  . CAD (coronary artery disease)    a. NSTEMI 10/03/15; b. cardiac cath 10/04/15: LM nl, ost-pLAD 90% s/p PCI/DES, mLAD 80% s/p PCI/DES, mRCA 30%, D1,2,3 nl, p-mLCX 30%, OM1 &2 min irregs, OM3 normal, EF 30-35%, sev HK of mid-distal ant, apical, inf wall, mod elevated LVEDP  . Chronic bronchitis ([place])   . Chronic cough   . COPD (chronic obstructive pulmonary disease) ([place])   . Dysrhythmia   . GERD (gastroesophageal reflux disease)   . Heart murmur   . HLD (hyperlipidemia)   . Ischemic cardiomyopathy    a. echo 10/05/15: EF 50%, mild HK of anteroseptal and anterior wall, nl LV diastolic fxn  . Myocardial infarction ([place]) 09/2015  . Status post primary angioplasty with coronary stent   . Tobacco abuse   . Wears dentures    full upper and lower   Past Surgical History:  Procedure Laterality Date  . CARDIAC CATHETERIZATION N/A 10/04/2015   Procedure: Left Heart Cath and Coronary Angiography;  Surgeon: Wellington Hampshire, MD;  Location: [place] [facility];  Service: Cardiovascular;  Laterality: N/A;  . CARDIAC CATHETERIZATION N/A 10/04/2015   Procedure: Coronary Stent Intervention;  Surgeon: Wellington Hampshire, MD;  Location: [place] [facility];  Service: Cardiovascular;  Laterality: N/A;  . CATARACT EXTRACTION W/PHACO Right 12/18/2018   Procedure: CATARACT EXTRACTION PHACO AND INTRAOCULAR LENS PLACEMENT ([place])  RIGHT;  Surgeon:  Leandrew Koyanagi, MD;  Location: [place];  Service: Ophthalmology;  Laterality: Right;  . CATARACT EXTRACTION W/PHACO Left 01/08/2019   Procedure: CATARACT EXTRACTION PHACO AND INTRAOCULAR LENS PLACEMENT ([place]) LEFT;  Surgeon: Leandrew Koyanagi, MD;  Location: [place];  Service: Ophthalmology;  Laterality: Left;  leave arrival at 10:30  Total Time: 00:43.0 Total Equivalent Power: 14.4% Cumulative Dissipated Energy: 6.23  . DRESSING CHANGE UNDER ANESTHESIA Right 05/08/2019   Procedure: DRESSING CHANGE UNDER ANESTHESIA;  Surgeon: Benjamine Sprague, DO;  Location: [facility];  Service: General;  Laterality: Right;  . INCISION AND DRAINAGE ABSCESS Right 05/06/2019   Procedure: INCISION AND DRAINAGE ABSCESS, RIGHT;  Surgeon: Benjamine Sprague, DO;  Location: [facility];  Service: General;  Laterality: Right;    Allergies  Allergies  Allergen Reactions  . Penicillins Rash    Has patient had a PCN reaction causing immediate rash, facial/tongue/throat swelling, SOB or lightheadedness with hypotension: Yes Has patient had a PCN reaction causing severe rash involving mucus membranes or skin necrosis: No Has patient had a PCN reaction that required hospitalization No Has patient had a PCN reaction occurring within the last 10 years: No If all of the above answers are "NO", then may proceed with Cephalosporin use.    History of Present Illness    [name] is a 65 y.o. female with a hx of  coronary artery disease, ischemic cardiomyopathy, hypertension, COPD, hyperlipidemia.  She was last seen 12/19/2019.  Presented in June 2017 with small NSTEMI.  Underwent cardiac cath showing  severe proximal and mid LAD disease.  Underwent successful angioplasty and 2 overlapping DES to the LAD without complications.  EF on LV gram 30-35% but improved on echo prior to hospital discharge to 50%.   Previous ABI December 2019 which was normal bilaterally.   She was admitted to [facility] 05/04/2019  -05/08/2019 for cellulitis of right axilla and breast with evolving abscess.  She underwent I&D on 05/06/2019.  She was discharged with home health for wet-to-dry dressing changes. Treated with IV antibiotics and discharged with antibiotics.  She was seen in follow-up 06/13/2019 as well as 12/19/2019 doing reasonably well from a cardiac perspective with stable chronic exertional dyspnea. At clinic visit 12/19/19 Losartan was increased to 181m QD due to hypertension.   Presents today for follow-up.  She reports feeling overall well. Walks in her neighborhood for exercise when the weather is nice but has not yet started for this year. She has been having some right leg pain which improves with ibuprofen. Tells me it hurts when she gets up and improves with ambulation.  We discussed likely diagnosis of arthritis and she was encouraged to follow-up with primary care provider.  Endorses eating her healthy diet.  She is please note that she has lost 3 pounds in the last 6 months.  She checks her blood pressure intermittently with her wrist cuff but cannot recall readings.  EKGs/Labs/Other Studies Reviewed:   The following studies were reviewed today:  EKG:  EKG is ordered today.  The ekg ordered today demonstrates NSR 65 with no acute St/T wave changes.   Recent Labs: 12/26/2019: BUN 23; Creatinine, Ser 0.95; Potassium 4.0; Sodium 142  Recent Lipid Panel    Component Value Date/Time   CHOL 148 06/13/2019 0951   TRIG 95 06/13/2019 0951   HDL 78 06/13/2019 0951   CHOLHDL 1.9 06/13/2019 0951   CHOLHDL 2.4 06/13/2016 1133   VLDL 26 06/13/2016 1133   LDLCALC 53 06/13/2019 0951   Home Medications   Current Meds  Medication Sig  . albuterol (VENTOLIN HFA) 108 (90 Base) MCG/ACT inhaler INHALE 2 PUFFS INTO THE LUNGS EVERY 6 HOURS AS NEEDED FOR WHEEZING OR SHORTNESS OF BREATH.  .Marland KitchenamLODipine (NORVASC) 5 MG tablet Take 1 tablet (5 mg total) by mouth daily.  .Marland Kitchenaspirin 81 MG chewable tablet Chew 1 tablet (81 mg  total) by mouth daily.  . carvedilol (COREG) 3.125 MG tablet TAKE 1 TABLET BY MOUTH TWICE A DAY  . ibuprofen (ADVIL) 800 MG tablet Take 1 tablet (800 mg total) by mouth every 8 (eight) hours as needed for mild pain or moderate pain.  .Marland Kitchenlosartan (COZAAR) 100 MG tablet Take 1 tablet (100 mg total) by mouth daily.  .Marland Kitchenomeprazole (PRILOSEC) 20 MG capsule TAKE 1 CAPSULE BY MOUTH TWICE DAILY BEFORE MEALS  . PRALUENT 75 MG/ML SOAJ INJECT 75 MG INTO THE SKIN EVERY 14 DAYSAS DIRECTED  . rosuvastatin (CRESTOR) 40 MG tablet TAKE 1 TABLET BY MOUTH ONCE A DAY  . SYMBICORT 160-4.5 MCG/ACT inhaler INHALE 1 PUFF INTO THE LUNGS 2 TIMES DAILY. RINSE MOUTH AFTER USE.     Review of Systems   All other systems reviewed and are otherwise negative except as noted above.  Physical Exam    VS:  BP (!) 142/80 (BP Location: Right Arm, Patient Position: Sitting)   Pulse 65   Ht '5\' 4"'  (1.626 m)   Wt 193 lb (87.5 kg)   SpO2 96%   BMI 33.13 kg/m  , BMI Body mass  index is 33.13 kg/m.  Wt Readings from Last 3 Encounters:  07/15/20 193 lb (87.5 kg)  12/19/19 196 lb 3.2 oz (89 kg)  06/13/19 190 lb 8 oz (86.4 kg)    GEN: Well nourished, overnight, well developed, in no acute distress. HEENT: normal. Neck: Supple, no JVD, carotid bruits, or masses. Cardiac: RRR, no murmurs, rubs, or gallops. No clubbing, cyanosis, edema.  Radials/DP/PT 2+ and equal bilaterally.  Respiratory:  Respirations regular and unlabored, clear to auscultation bilaterally. GI: Soft, nontender, nondistended. MS: No deformity or atrophy. Skin: Warm and dry, no rash. Neuro:  Strength and sensation are intact. Psych: Normal affect.  Assessment & Plan    1. CAD-stable with no anginal symptoms.  No indication for ischemic evaluation at this time.  GDMT includes aspirin, beta-blocker, statin.  Regular cardiovascular exercise and heart healthy diet encouraged.  2. Hypertension-BP elevated.  Continue losartan 100 mg daily and Coreg 3.125 mg  twice daily.  Start amlodipine 5 mg daily.  Future considerations include increased dose of amlodipine not at goal.  3. Hyperlipidemia- Due for repeat lab work today which she politely declines.  C-Met, lipid panel, direct LDL ordered to be collected at the medical mall at her convenience.  Continue Praluent and rosuvastatin.  Denies myalgias.  4. Tobacco abuse/COPD- Smoking cessation encouraged. Recommend utilization of [id number].  Encouraged to schedule overdue follow-up with pulmonology.  5. Nonischemic cardiomyopathy-euvolemic well compensated on exam.  Most recent echo with normal LVEF.  GDMT includes Coreg, losartan.  No indication for aortic at this time.  Low-salt diet encouraged.  Disposition: Follow up in 6 month(s) with Dr. Fletcher Anon or APP  Signed, Loel Dubonnet, NP 07/15/2020, 4:30 PM [place]

## 2020-08-02 ENCOUNTER — Other Ambulatory Visit: Payer: Self-pay | Admitting: Cardiovascular Disease

## 2020-08-02 NOTE — Telephone Encounter (Signed)
Rx request sent to pharmacy.  

## 2020-08-09 ENCOUNTER — Other Ambulatory Visit
Admission: RE | Admit: 2020-08-09 | Discharge: 2020-08-09 | Disposition: A | Discharge status: Home / Self Care | Payer: Medicare Other | Attending: Family | Admitting: Family

## 2020-08-09 ENCOUNTER — Other Ambulatory Visit: Payer: Self-pay

## 2020-08-09 DIAGNOSIS — E785 Hyperlipidemia, unspecified: Secondary | ICD-10-CM | POA: Insufficient documentation

## 2020-08-09 DIAGNOSIS — I10 Essential (primary) hypertension: Secondary | ICD-10-CM | POA: Diagnosis present

## 2020-08-09 LAB — LIPID PANEL
Cholesterol: 155 mg/dL (ref 0–200)
HDL: 77 mg/dL (ref >40)
LDL Cholesterol: 62 mg/dL (ref 0–99)
Total CHOL/HDL Ratio: 2 RATIO
Triglycerides: 78 mg/dL (ref <150)
VLDL: 16 mg/dL (ref 0–40)

## 2020-08-09 LAB — COMPREHENSIVE METABOLIC PANEL
ALT: 18 U/L (ref 0–44)
AST: 19 U/L (ref 15–41)
Albumin: 3.8 g/dL (ref 3.5–5.0)
Alkaline Phosphatase: 73 U/L (ref 38–126)
Anion gap: 9 (ref 5–15)
BUN: 14 mg/dL (ref 8–23)
CO2: 27 mmol/L (ref 22–32)
Calcium: 9 mg/dL (ref 8.9–10.3)
Chloride: 102 mmol/L (ref 98–111)
Creatinine, Ser: 0.91 mg/dL (ref 0.44–1.00)
GFR, Estimated: >60 mL/min (ref >60)
Glucose, Bld: 90 mg/dL (ref 70–99)
Potassium: 4.3 mmol/L (ref 3.5–5.1)
Sodium: 138 mmol/L (ref 135–145)
Total Bilirubin: 0.4 mg/dL (ref 0.3–1.2)
Total Protein: 7.1 g/dL (ref 6.5–8.1)

## 2020-08-09 LAB — LDL CHOLESTEROL, DIRECT: Direct LDL: 60.5 mg/dL (ref 0–99)

## 2020-09-06 ENCOUNTER — Other Ambulatory Visit: Payer: Self-pay | Admitting: Cardiovascular Disease

## 2020-10-26 ENCOUNTER — Other Ambulatory Visit: Payer: Self-pay | Admitting: *Deleted

## 2020-10-26 ENCOUNTER — Other Ambulatory Visit: Payer: Self-pay | Admitting: Cardiovascular Disease

## 2020-10-26 MED ORDER — LOSARTAN POTASSIUM 100 MG PO TABS: 100.0000 mg | ORAL_TABLET | Freq: Every day | ORAL | 1 refills | Status: DC

## 2020-10-26 NOTE — Telephone Encounter (Signed)
Received refill request from pharmacy for Losartan. Refill sent for #90 and 1 refill.

## 2021-01-04 ENCOUNTER — Other Ambulatory Visit: Payer: Self-pay | Admitting: Cardiovascular Disease

## 2021-01-04 ENCOUNTER — Other Ambulatory Visit: Payer: Self-pay | Admitting: Family

## 2021-01-14 ENCOUNTER — Ambulatory Visit (INDEPENDENT_AMBULATORY_CARE_PROVIDER_SITE_OTHER): Payer: Medicare Other | Admitting: Cardiovascular Disease

## 2021-01-14 ENCOUNTER — Encounter: Payer: Self-pay | Admitting: Cardiovascular Disease

## 2021-01-14 ENCOUNTER — Other Ambulatory Visit: Payer: Self-pay

## 2021-01-14 VITALS — BP 118/80 | HR 65 | Ht 64.0 in | Wt 186.5 lb

## 2021-01-14 DIAGNOSIS — I10 Essential (primary) hypertension: Secondary | ICD-10-CM

## 2021-01-14 DIAGNOSIS — E785 Hyperlipidemia, unspecified: Secondary | ICD-10-CM

## 2021-01-14 DIAGNOSIS — I2510 Atherosclerotic heart disease of native coronary artery without angina pectoris: Secondary | ICD-10-CM

## 2021-01-14 DIAGNOSIS — Z720 Tobacco use: Secondary | ICD-10-CM | POA: Diagnosis not present

## 2021-01-14 DIAGNOSIS — I255 Ischemic cardiomyopathy: Secondary | ICD-10-CM

## 2021-01-14 NOTE — Progress Notes (Signed)
Cardiology Office Note   Date:  01/14/2021   ID:  [name], DOB March 02, 1956, MRN [id number]  PCP:  Pcp, No  Cardiologist:   Lorine Bears, MD   Chief Complaint  Patient presents with   Other    6 month f/u no complaints today . Meds reviewed verbally with pt.       History of Present Illness: [name] is a 65 y.o. female who presents for a follow-up visit regarding coronary artery disease and ischemic cardiomyopathy. She presented in June of 2017 with a small non-ST elevation myocardial infarction. She underwent cardiac catheterization which showed severe proximal and mid LAD disease. No other obstructive disease. She underwent successful angioplasty and 2 overlapped drug-eluting stent placement to the LAD without complications. Ejection fraction on LV gram was 30-35% but improved an echocardiogram before hospital discharge to 50%. She has other chronic medical conditions that include severe hyperlipidemia, essential hypertension, COPD and tobacco use.  She was most recently seen in March and blood pressure was still elevated.  Amlodipine 5 mg daily was added.  He has been doing very well with no recent chest pain, shortness of breath or palpitations.  She takes her medications regularly.  She cut down on tobacco use but has not been able to quit completely.  Past Medical History:  Diagnosis Date   CAD (coronary artery disease)    a. NSTEMI 10/03/15; b. cardiac cath 10/04/15: LM nl, ost-pLAD 90% s/p PCI/DES, mLAD 80% s/p PCI/DES, mRCA 30%, D1,2,3 nl, p-mLCX 30%, OM1 &2 min irregs, OM3 normal, EF 30-35%, sev HK of mid-distal ant, apical, inf wall, mod elevated LVEDP   Chronic bronchitis ([facility])    Chronic cough    COPD (chronic obstructive pulmonary disease) ([facility])    Dysrhythmia    GERD (gastroesophageal reflux disease)    Heart murmur    HLD (hyperlipidemia)    Ischemic cardiomyopathy    a. echo 10/05/15: EF 50%, mild HK of anteroseptal and anterior wall, nl LV  diastolic fxn   Myocardial infarction ([facility]) 09/2015   Status post primary angioplasty with coronary stent    Tobacco abuse    Wears dentures    full upper and lower    Past Surgical History:  Procedure Laterality Date   CARDIAC CATHETERIZATION N/A 10/04/2015   Procedure: Left Heart Cath and Coronary Angiography;  Surgeon: Iran Ouch, MD;  Location: [facility];  Service: Cardiovascular;  Laterality: N/A;   CARDIAC CATHETERIZATION N/A 10/04/2015   Procedure: Coronary Stent Intervention;  Surgeon: Iran Ouch, MD;  Location: [facility];  Service: Cardiovascular;  Laterality: N/A;   CATARACT EXTRACTION W/PHACO Right 12/18/2018   Procedure: CATARACT EXTRACTION PHACO AND INTRAOCULAR LENS PLACEMENT ([facility])  RIGHT;  Surgeon: Lockie Mola, MD;  Location: [facility];  Service: Ophthalmology;  Laterality: Right;   CATARACT EXTRACTION W/PHACO Left 01/08/2019   Procedure: CATARACT EXTRACTION PHACO AND INTRAOCULAR LENS PLACEMENT ([facility]) LEFT;  Surgeon: Lockie Mola, MD;  Location: [facility];  Service: Ophthalmology;  Laterality: Left;  leave arrival at 10:30  Total Time: 00:43.0 Total Equivalent Power: 14.4% Cumulative Dissipated Energy: 6.23   DRESSING CHANGE UNDER ANESTHESIA Right 05/08/2019   Procedure: DRESSING CHANGE UNDER ANESTHESIA;  Surgeon: Sung Amabile, DO;  Location: [facility];  Service: General;  Laterality: Right;   INCISION AND DRAINAGE ABSCESS Right 05/06/2019   Procedure: INCISION AND DRAINAGE ABSCESS, RIGHT;  Surgeon: Sung Amabile, DO;  Location: [facility];  Service: General;  Laterality: Right;  Current Outpatient Medications  Medication Sig Dispense Refill   albuterol (VENTOLIN HFA) 108 (90 Base) MCG/ACT inhaler INHALE 2 PUFFS INTO THE LUNGS EVERY 6 HOURS AS NEEDED FOR WHEEZING OR SHORTNESS OF BREATH. 8.5 g 0   amLODipine (NORVASC) 5 MG tablet Take 1 tablet (5 mg total) by mouth daily. 90 tablet 3   aspirin 81 MG chewable  tablet Chew 1 tablet (81 mg total) by mouth daily.     carvedilol (COREG) 3.125 MG tablet TAKE 1 TABLET BY MOUTH TWICE A DAY 180 tablet 1   ibuprofen (ADVIL) 800 MG tablet Take 1 tablet (800 mg total) by mouth every 8 (eight) hours as needed for mild pain or moderate pain. 30 tablet 0   losartan (COZAAR) 100 MG tablet Take 1 tablet (100 mg total) by mouth daily. 90 tablet 1   omeprazole (PRILOSEC) 20 MG capsule TAKE 1 CAPSULE BY MOUTH TWICE DAILY BEFORE MEALS 180 capsule 0   PRALUENT 75 MG/ML SOAJ INJECT 75 MG INTO THE SKIN EVERY 14 DAYSAS DIRECTED 2 mL 1   rosuvastatin (CRESTOR) 40 MG tablet TAKE 1 TABLET BY MOUTH ONCE A DAY 90 tablet 1   SYMBICORT 160-4.5 MCG/ACT inhaler INHALE 1 PUFF INTO THE LUNGS 2 TIMES DAILY. RINSE MOUTH AFTER USE. 10.2 g 0   No current facility-administered medications for this visit.    Allergies:   Penicillins    Social History:  The patient  reports that she has been smoking cigarettes. She has a 10.50 pack-year smoking history. She has never used smokeless tobacco. She reports that she does not currently use alcohol after a past usage of about 5.0 standard drinks per week. She reports that she does not use drugs.   Family History:  The patient's family history includes CAD in her mother; Colon cancer in her mother.    ROS:  Please see the history of present illness.   Otherwise, review of systems are positive for none.   All other systems are reviewed and negative.    PHYSICAL EXAM: VS:  BP 118/80 (BP Location: Left Arm, Patient Position: Sitting, Cuff Size: Normal)   Pulse 65   Ht 5\' 4"  (1.626 m)   Wt 186 lb 8 oz (84.6 kg)   SpO2 98%   BMI 32.01 kg/m  , BMI Body mass index is 32.01 kg/m. GEN: Well nourished, well developed, in no acute distress  HEENT: normal  Neck: no JVD, carotid bruits, or masses Cardiac: RRR; no rubs, or gallops,no edema .  2 out of 6 systolic murmur in the aortic area which is early peaking. Respiratory:  clear to auscultation  bilaterally, normal work of breathing GI: soft, nontender, nondistended, + BS MS: no deformity or atrophy  Skin: warm and dry, no rash Neuro:  Strength and sensation are intact Psych: euthymic mood, full affect    EKG:  EKG is ordered today. EKG showed normal sinus rhythm with low voltage.   Recent Labs: 08/09/2020: ALT 18; BUN 14; Creatinine, Ser 0.91; Potassium 4.3; Sodium 138    Lipid Panel    Component Value Date/Time   CHOL 155 08/09/2020 1303   CHOL 148 06/13/2019 0951   TRIG 78 08/09/2020 1303   HDL 77 08/09/2020 1303   HDL 78 06/13/2019 0951   CHOLHDL 2.0 08/09/2020 1303   VLDL 16 08/09/2020 1303   LDLCALC 62 08/09/2020 1303   LDLCALC 53 06/13/2019 0951   LDLDIRECT 60.5 08/09/2020 1303      Wt Readings from Last 3 Encounters:  01/14/21 186 lb 8 oz (84.6 kg)  07/15/20 193 lb (87.5 kg)  12/19/19 196 lb 3.2 oz (89 kg)        ASSESSMENT AND PLAN:  1.  Coronary artery disease involving native coronary arteries without angina: She is overall doing very well.  Continue medical therapy.  Continue aspirin indefinitely.  2. Ischemic cardiomyopathy with ejection fraction of 50%. Continue treatment with  small dose carvedilol and losartan.  3. Hyperlipidemia: I reviewed her most recent lipid profile done in April of this year which showed an LDL of 53 which is at target.  Based on this, we will continue with Praluent and rosuvastatin.    4. Tobacco use: She cut down but has not been able to quit completely.  I discussed with her the importance of smoking cessation.  5.  Essential hypertension: Blood pressure is not well controlled since addition of amlodipine.  6.  Cardiac murmur suggestive of aortic sclerosis.  Continue to monitor clinically.     Disposition:   FU with me in 6 months.     Signed,  Lorine Bears, MD  01/14/2021 1:45 PM    [facility]

## 2021-01-14 NOTE — Patient Instructions (Signed)

## 2021-01-24 ENCOUNTER — Other Ambulatory Visit: Payer: Self-pay | Admitting: Cardiovascular Disease

## 2021-03-07 ENCOUNTER — Other Ambulatory Visit: Payer: Self-pay | Admitting: Cardiovascular Disease

## 2021-04-23 ENCOUNTER — Other Ambulatory Visit: Payer: Self-pay | Admitting: Nurse Practitioner

## 2021-04-23 ENCOUNTER — Other Ambulatory Visit: Payer: Self-pay | Admitting: Cardiovascular Disease

## 2021-04-26 ENCOUNTER — Other Ambulatory Visit: Payer: Self-pay | Admitting: Cardiovascular Disease

## 2021-04-26 NOTE — Telephone Encounter (Signed)
This is a [place] pt, Dr. Arida °

## 2021-07-14 ENCOUNTER — Encounter: Payer: Self-pay | Admitting: Cardiovascular Disease

## 2021-07-14 ENCOUNTER — Other Ambulatory Visit: Payer: Self-pay

## 2021-07-14 ENCOUNTER — Ambulatory Visit (INDEPENDENT_AMBULATORY_CARE_PROVIDER_SITE_OTHER): Payer: Medicare Other | Admitting: Cardiovascular Disease

## 2021-07-14 VITALS — BP 118/70 | HR 67 | Ht 64.0 in | Wt 183.4 lb

## 2021-07-14 DIAGNOSIS — I255 Ischemic cardiomyopathy: Secondary | ICD-10-CM | POA: Diagnosis not present

## 2021-07-14 DIAGNOSIS — I10 Essential (primary) hypertension: Secondary | ICD-10-CM | POA: Diagnosis not present

## 2021-07-14 DIAGNOSIS — I2510 Atherosclerotic heart disease of native coronary artery without angina pectoris: Secondary | ICD-10-CM | POA: Diagnosis not present

## 2021-07-14 DIAGNOSIS — Z720 Tobacco use: Secondary | ICD-10-CM | POA: Diagnosis not present

## 2021-07-14 DIAGNOSIS — E785 Hyperlipidemia, unspecified: Secondary | ICD-10-CM | POA: Diagnosis not present

## 2021-07-14 NOTE — Progress Notes (Signed)
?  ?Cardiology Office Note ? ? ?Date:  07/14/2021  ? ?ID:  [name], DOB May 09, 1955, MRN [id number] ? ?PCP:  Pcp, No  ?Cardiologist:   Kathlyn Sacramento, MD  ? ?Chief Complaint  ?Patient presents with  ? Other  ?  6 month f/u no complaints today. Meds reviewed verbally with pt.  ? ? ? ?  ?History of Present Illness: ?[name] is a 66 y.o. female who presents for a follow-up visit regarding coronary artery disease and ischemic cardiomyopathy. ?She presented in June of 2017 with a small non-ST elevation myocardial infarction. She underwent cardiac catheterization which showed severe proximal and mid LAD disease. No other obstructive disease. She underwent successful angioplasty and 2 overlapped drug-eluting stent placement to the LAD without complications. Ejection fraction on LV gram was 30-35% but improved an echocardiogram before hospital discharge to 50%. ?She has other chronic medical conditions that include severe hyperlipidemia, essential hypertension, COPD and tobacco use. ? ?Blood pressure improved with addition of amlodipine.  She was most recently seen in September.  She has been doing very well with no recent chest pain, shortness of breath or palpitation.  She cut down on tobacco use. ? ?Past Medical History:  ?Diagnosis Date  ? CAD (coronary artery disease)   ? a. NSTEMI 10/03/15; b. cardiac cath 10/04/15: LM nl, ost-pLAD 90% s/p PCI/DES, mLAD 80% s/p PCI/DES, mRCA 30%, D1,2,3 nl, p-mLCX 30%, OM1 &2 min irregs, OM3 normal, EF 30-35%, sev HK of mid-distal ant, apical, inf wall, mod elevated LVEDP  ? Chronic bronchitis ([place])   ? Chronic cough   ? COPD (chronic obstructive pulmonary disease) ([place])   ? Dysrhythmia   ? GERD (gastroesophageal reflux disease)   ? Heart murmur   ? HLD (hyperlipidemia)   ? Ischemic cardiomyopathy   ? a. echo 10/05/15: EF 50%, mild HK of anteroseptal and anterior wall, nl LV diastolic fxn  ? Myocardial infarction ([place]) 09/2015  ? Status post primary angioplasty with  coronary stent   ? Tobacco abuse   ? Wears dentures   ? full upper and lower  ? ? ?Past Surgical History:  ?Procedure Laterality Date  ? CARDIAC CATHETERIZATION N/A 10/04/2015  ? Procedure: Left Heart Cath and Coronary Angiography;  Surgeon: Wellington Hampshire, MD;  Location: [place] [facility];  Service: Cardiovascular;  Laterality: N/A;  ? CARDIAC CATHETERIZATION N/A 10/04/2015  ? Procedure: Coronary Stent Intervention;  Surgeon: Wellington Hampshire, MD;  Location: [place] [facility];  Service: Cardiovascular;  Laterality: N/A;  ? CATARACT EXTRACTION W/PHACO Right 12/18/2018  ? Procedure: CATARACT EXTRACTION PHACO AND INTRAOCULAR LENS PLACEMENT ([place])  RIGHT;  Surgeon: Leandrew Koyanagi, MD;  Location: [place];  Service: Ophthalmology;  Laterality: Right;  ? CATARACT EXTRACTION W/PHACO Left 01/08/2019  ? Procedure: CATARACT EXTRACTION PHACO AND INTRAOCULAR LENS PLACEMENT ([place]) LEFT;  Surgeon: Leandrew Koyanagi, MD;  Location: [place];  Service: Ophthalmology;  Laterality: Left;  leave arrival at 10:30 ? ?Total Time: 00:43.0 ?Total Equivalent Power: 14.4% ?Cumulative Dissipated Energy: 6.23  ? DRESSING CHANGE UNDER ANESTHESIA Right 05/08/2019  ? Procedure: DRESSING CHANGE UNDER ANESTHESIA;  Surgeon: Benjamine Sprague, DO;  Location: [facility];  Service: General;  Laterality: Right;  ? INCISION AND DRAINAGE ABSCESS Right 05/06/2019  ? Procedure: INCISION AND DRAINAGE ABSCESS, RIGHT;  Surgeon: Benjamine Sprague, DO;  Location: [facility];  Service: General;  Laterality: Right;  ? ? ? ?Current Outpatient Medications  ?Medication Sig Dispense Refill  ? albuterol (VENTOLIN HFA) 108 (90 Base)  MCG/ACT inhaler INHALE 2 PUFFS INTO THE LUNGS EVERY 6 HOURS AS NEEDED FOR WHEEZING OR SHORTNESS OF BREATH. 8.5 g 0  ? amLODipine (NORVASC) 5 MG tablet Take 1 tablet (5 mg total) by mouth daily. 90 tablet 3  ? aspirin 81 MG chewable tablet Chew 1 tablet (81 mg total) by mouth daily.    ? carvedilol (COREG) 3.125 MG tablet TAKE 1  TABLET BY MOUTH TWICE A DAY 180 tablet 1  ? ibuprofen (ADVIL) 800 MG tablet Take 1 tablet (800 mg total) by mouth every 8 (eight) hours as needed for mild pain or moderate pain. 30 tablet 0  ? losartan (COZAAR) 100 MG tablet TAKE 1 TABLET BY MOUTH ONCE A DAY 90 tablet 0  ? omeprazole (PRILOSEC) 20 MG capsule TAKE 1 CAPSULE BY MOUTH TWICE DAILY BEFORE MEALS 180 capsule 0  ? PRALUENT 75 MG/ML SOAJ INJECT 75 MG INTO THE SKIN EVERY 14 DAYSAS DIRECTED 2 mL 2  ? rosuvastatin (CRESTOR) 40 MG tablet TAKE 1 TABLET BY MOUTH ONCE A DAY 90 tablet 1  ? SYMBICORT 160-4.5 MCG/ACT inhaler INHALE 1 PUFF INTO THE LUNGS 2 TIMES DAILY. RINSE MOUTH AFTER USE. 10.2 g 0  ? ?No current facility-administered medications for this visit.  ? ? ?Allergies:   Penicillins  ? ? ?Social History:  The patient  reports that she has been smoking cigarettes. She has a 10.50 pack-year smoking history. She has never used smokeless tobacco. She reports that she does not currently use alcohol after a past usage of about 5.0 standard drinks per week. She reports that she does not use drugs.  ? ?Family History:  The patient's family history includes CAD in her mother; Colon cancer in her mother.  ? ? ?ROS:  Please see the history of present illness.   Otherwise, review of systems are positive for none.   All other systems are reviewed and negative.  ? ? ?PHYSICAL EXAM: ?VS:  BP 118/70 (BP Location: Left Arm, Patient Position: Sitting, Cuff Size: Normal)   Pulse 67   Ht 5\' 4"  (1.626 m)   Wt 183 lb 6 oz (83.2 kg)   SpO2 95%   BMI 31.48 kg/m?  , BMI Body mass index is 31.48 kg/m?. ?GEN: Well nourished, well developed, in no acute distress  ?HEENT: normal  ?Neck: no JVD, carotid bruits, or masses ?Cardiac: RRR; no rubs, or gallops,no edema .  2 /6 systolic murmur in the aortic area which is early peaking. ?Respiratory:  clear to auscultation bilaterally, normal work of breathing ?GI: soft, nontender, nondistended, + BS ?MS: no deformity or atrophy  ?Skin:  warm and dry, no rash ?Neuro:  Strength and sensation are intact ?Psych: euthymic mood, full affect ? ? ? ?EKG:  EKG is ordered today. ?EKG showed normal sinus rhythm with low voltage. ? ? ?Recent Labs: ?08/09/2020: ALT 18; BUN 14; Creatinine, Ser 0.91; Potassium 4.3; Sodium 138  ? ? ?Lipid Panel ?   ?Component Value Date/Time  ? CHOL 155 08/09/2020 1303  ? CHOL 148 06/13/2019 0951  ? TRIG 78 08/09/2020 1303  ? HDL 77 08/09/2020 1303  ? HDL 78 06/13/2019 0951  ? CHOLHDL 2.0 08/09/2020 1303  ? VLDL 16 08/09/2020 1303  ? [place] 62 08/09/2020 1303  ? LDLCALC 53 06/13/2019 0951  ? LDLDIRECT 60.5 08/09/2020 1303  ? ?  ? ?Wt Readings from Last 3 Encounters:  ?07/14/21 183 lb 6 oz (83.2 kg)  ?01/14/21 186 lb 8 oz (84.6 kg)  ?07/15/20 193  lb (87.5 kg)  ?  ? ? ? ? ?ASSESSMENT AND PLAN: ? ?1.  Coronary artery disease involving native coronary arteries without angina: She is overall doing very well.  Continue medical therapy.  Continue aspirin indefinitely. ? ?2. Ischemic cardiomyopathy with ejection fraction of 50%. Continue treatment with  small dose carvedilol and losartan. ? ?3. Hyperlipidemia: Continue Praluent and rosuvastatin.  I requested fasting lipid profile and liver profile. ? ?4. Tobacco use: She cut down but has not been able to quit completely.  I discussed with her the importance of smoking cessation. ? ?5.  Essential hypertension: Blood pressure is now well controlled on small dose carvedilol, amlodipine and losartan. ? ?6.  Cardiac murmur suggestive of aortic sclerosis.  The murmur is unchanged.  No indication for an echocardiogram. ? ? ?Disposition:   FU with me in 6 months.   ? ? ?Signed, ? ?Kathlyn Sacramento, MD  ?07/14/2021 4:06 PM    ?[place] ?

## 2021-07-14 NOTE — Patient Instructions (Addendum)
Medication Instructions:  ?Your physician recommends that you continue on your current medications as directed. Please refer to the Current Medication list given to you today. ? ?*If you need a refill on your cardiac medications before your next appointment, please call your pharmacy* ? ? ?Lab Work: ?Your physician recommends that you return for a FASTING lipid profile, cmp, cbc at your earliest convenience ? ?If you have labs (blood work) drawn today and your tests are completely normal, you will receive your results only by: ?MyChart Message (if you have MyChart) OR ?A paper copy in the mail ?If you have any lab test that is abnormal or we need to change your treatment, we will call you to review the results. ? ? ?Testing/Procedures: ?None ordered ? ? ?Follow-Up: ?At [facility], you and your health needs are our priority.  As part of our continuing mission to provide you with exceptional heart care, we have created designated Provider Care Teams.  These Care Teams include your primary Cardiologist (physician) and Advanced Practice Providers (APPs -  Physician Assistants and Nurse Practitioners) who all work together to provide you with the care you need, when you need it. ? ?We recommend signing up for the patient portal called "MyChart".  Sign up information is provided on this After Visit Summary.  MyChart is used to connect with patients for Virtual Visits (Telemedicine).  Patients are able to view lab/test results, encounter notes, upcoming appointments, etc.  Non-urgent messages can be sent to your provider as well.   ?To learn more about what you can do with MyChart, go to [email or web address].   ? ?Your next appointment:   ?6 month(s) ? ?The format for your next appointment:   ?In Person ? ?Provider:   ?You may see Lorine Bears, MD or one of the following Advanced Practice Providers on your designated Care Team:   ?Nicolasa Ducking, NP ?Eula Listen, PA-C ?Cadence Fransico Michael, PA-C ? ? ?Other  Instructions ?N/A ? ?

## 2021-07-22 ENCOUNTER — Other Ambulatory Visit: Payer: Self-pay | Admitting: Cardiovascular Disease

## 2021-08-04 ENCOUNTER — Other Ambulatory Visit: Payer: Self-pay | Admitting: Cardiovascular Disease

## 2021-10-03 ENCOUNTER — Other Ambulatory Visit: Payer: Self-pay | Admitting: Cardiovascular Disease

## 2022-01-24 ENCOUNTER — Other Ambulatory Visit: Payer: Self-pay | Admitting: Cardiovascular Disease

## 2022-03-08 ENCOUNTER — Other Ambulatory Visit: Payer: Self-pay | Admitting: Cardiovascular Disease

## 2022-03-13 ENCOUNTER — Other Ambulatory Visit: Payer: Self-pay | Admitting: Cardiovascular Disease

## 2022-03-22 ENCOUNTER — Encounter: Payer: Self-pay | Admitting: Cardiovascular Disease

## 2022-03-22 ENCOUNTER — Ambulatory Visit: Payer: Medicare Other | Attending: Cardiovascular Disease | Admitting: Cardiovascular Disease

## 2022-03-22 ENCOUNTER — Encounter: Payer: Self-pay | Admitting: *Deleted

## 2022-03-22 ENCOUNTER — Other Ambulatory Visit
Admission: RE | Admit: 2022-03-22 | Discharge: 2022-03-22 | Disposition: A | Discharge status: Home / Self Care | Payer: Medicare Other | Source: Ambulatory Visit | Attending: Cardiovascular Disease | Admitting: Cardiovascular Disease

## 2022-03-22 VITALS — BP 110/78 | HR 80 | Ht 64.0 in | Wt 181.5 lb

## 2022-03-22 DIAGNOSIS — Z720 Tobacco use: Secondary | ICD-10-CM

## 2022-03-22 DIAGNOSIS — I255 Ischemic cardiomyopathy: Secondary | ICD-10-CM

## 2022-03-22 DIAGNOSIS — R0602 Shortness of breath: Secondary | ICD-10-CM

## 2022-03-22 DIAGNOSIS — E785 Hyperlipidemia, unspecified: Secondary | ICD-10-CM | POA: Insufficient documentation

## 2022-03-22 DIAGNOSIS — I2510 Atherosclerotic heart disease of native coronary artery without angina pectoris: Secondary | ICD-10-CM

## 2022-03-22 DIAGNOSIS — I10 Essential (primary) hypertension: Secondary | ICD-10-CM | POA: Diagnosis present

## 2022-03-22 LAB — CBC
HCT: 41.4 % (ref 36.0–46.0)
Hemoglobin: 13.6 g/dL (ref 12.0–15.0)
MCH: 28.9 pg (ref 26.0–34.0)
MCHC: 32.9 g/dL (ref 30.0–36.0)
MCV: 87.9 fL (ref 80.0–100.0)
Platelets: 268 10*3/uL (ref 150–400)
RBC: 4.71 MIL/uL (ref 3.87–5.11)
RDW: 13.7 % (ref 11.5–15.5)
WBC: 7 10*3/uL (ref 4.0–10.5)
nRBC: 0 % (ref 0.0–0.2)

## 2022-03-22 LAB — COMPREHENSIVE METABOLIC PANEL
ALT: 19 U/L (ref 0–44)
AST: 21 U/L (ref 15–41)
Albumin: 4 g/dL (ref 3.5–5.0)
Alkaline Phosphatase: 94 U/L (ref 38–126)
Anion gap: 10 (ref 5–15)
BUN: 15 mg/dL (ref 8–23)
CO2: 26 mmol/L (ref 22–32)
Calcium: 9.4 mg/dL (ref 8.9–10.3)
Chloride: 105 mmol/L (ref 98–111)
Creatinine, Ser: 1.01 mg/dL — ABNORMAL HIGH (ref 0.44–1.00)
GFR, Estimated: >60 mL/min (ref >60)
Glucose, Bld: 91 mg/dL (ref 70–99)
Potassium: 4.2 mmol/L (ref 3.5–5.1)
Sodium: 141 mmol/L (ref 135–145)
Total Bilirubin: 0.7 mg/dL (ref 0.3–1.2)
Total Protein: 7.2 g/dL (ref 6.5–8.1)

## 2022-03-22 LAB — LIPID PANEL
Cholesterol: 132 mg/dL (ref 0–200)
HDL: 67 mg/dL (ref >40)
LDL Cholesterol: 47 mg/dL (ref 0–99)
Total CHOL/HDL Ratio: 2 RATIO
Triglycerides: 89 mg/dL (ref <150)
VLDL: 18 mg/dL (ref 0–40)

## 2022-03-22 NOTE — Patient Instructions (Signed)
Medication Instructions:  No changes *If you need a refill on your cardiac medications before your next appointment, please call your pharmacy*   Lab Work: Your provider would like for you to have following labs drawn: CBC. CMET and Lipid.   Please go to the [facility] entrance and check in at the front desk.  You do not need an appointment.  They are open from 7am-6 pm.   If you have labs (blood work) drawn today and your tests are completely normal, you will receive your results only by: MyChart Message (if you have MyChart) OR A paper copy in the mail If you have any lab test that is abnormal or we need to change your treatment, we will call you to review the results.   Testing/Procedures: Your physician has requested that you have an echocardiogram. Echocardiography is a painless test that uses sound waves to create images of your heart. It provides your doctor with information about the size and shape of your heart and how well your heart's chambers and valves are working.   You may receive an ultrasound enhancing agent through an IV if needed to better visualize your heart during the echo. This procedure takes approximately one hour.  There are no restrictions for this procedure.  This will take place at [address], [place] [address]    Follow-Up: At [facility], you and your health needs are our priority.  As part of our continuing mission to provide you with exceptional heart care, we have created designated Provider Care Teams.  These Care Teams include your primary Cardiologist (physician) and Advanced Practice Providers (APPs -  Physician Assistants and Nurse Practitioners) who all work together to provide you with the care you need, when you need it.  We recommend signing up for the patient portal called "MyChart".  Sign up information is provided on this After Visit Summary.  MyChart is used to connect with patients  for Virtual Visits (Telemedicine).  Patients are able to view lab/test results, encounter notes, upcoming appointments, etc.  Non-urgent messages can be sent to your provider as well.   To learn more about what you can do with MyChart, go to [email or web address].    Your next appointment:   6 month(s)  The format for your next appointment:   In Person  Provider:   You may see Dr. Kirke Corin or one of the following Advanced Practice Providers on your designated Care Team:   Nicolasa Ducking, NP Eula Listen, PA-C Cadence Fransico Michael, PA-C Charlsie Quest, NP

## 2022-03-22 NOTE — Progress Notes (Signed)
Cardiology Office Note   Date:  03/22/2022   ID:  [name], DOB 1956/01/27, MRN [id number]  PCP:  Pcp, No  Cardiologist:   Kathlyn Sacramento, MD   Chief Complaint  Patient presents with   Other    6 month f/u no complaints today. Meds reviewed verbally with pt.       History of Present Illness: [name] is a 66 y.o. female who presents for a follow-up visit regarding coronary artery disease and ischemic cardiomyopathy. She presented in June of 2017 with a small non-ST elevation myocardial infarction. She underwent cardiac catheterization which showed severe proximal and mid LAD disease. No other obstructive disease. She underwent successful angioplasty and 2 overlapped drug-eluting stent placement to the LAD without complications. Ejection fraction on LV gram was 30-35% but improved an echocardiogram before hospital discharge to 50%. She has other chronic medical conditions that include severe hyperlipidemia, essential hypertension, COPD and tobacco use. She had lower extremity arterial Doppler in 2019 which showed normal ABI.  She has been doing reasonably well overall with no chest pain.  She reports shortness of breath but she thinks it is due to her continued tobacco use.  Past Medical History:  Diagnosis Date   CAD (coronary artery disease)    a. NSTEMI 10/03/15; b. cardiac cath 10/04/15: LM nl, ost-pLAD 90% s/p PCI/DES, mLAD 80% s/p PCI/DES, mRCA 30%, D1,2,3 nl, p-mLCX 30%, OM1 &2 min irregs, OM3 normal, EF 30-35%, sev HK of mid-distal ant, apical, inf wall, mod elevated LVEDP   Chronic bronchitis ([facility])    Chronic cough    COPD (chronic obstructive pulmonary disease) ([facility])    Dysrhythmia    GERD (gastroesophageal reflux disease)    Heart murmur    HLD (hyperlipidemia)    Ischemic cardiomyopathy    a. echo 10/05/15: EF 50%, mild HK of anteroseptal and anterior wall, nl LV diastolic fxn   Myocardial infarction ([place]) 09/2015   Status post primary angioplasty  with coronary stent    Tobacco abuse    Wears dentures    full upper and lower    Past Surgical History:  Procedure Laterality Date   CARDIAC CATHETERIZATION N/A 10/04/2015   Procedure: Left Heart Cath and Coronary Angiography;  Surgeon: Wellington Hampshire, MD;  Location: [place] [facility];  Service: Cardiovascular;  Laterality: N/A;   CARDIAC CATHETERIZATION N/A 10/04/2015   Procedure: Coronary Stent Intervention;  Surgeon: Wellington Hampshire, MD;  Location: [place] [facility];  Service: Cardiovascular;  Laterality: N/A;   CATARACT EXTRACTION W/PHACO Right 12/18/2018   Procedure: CATARACT EXTRACTION PHACO AND INTRAOCULAR LENS PLACEMENT ([place])  RIGHT;  Surgeon: Leandrew Koyanagi, MD;  Location: [place];  Service: Ophthalmology;  Laterality: Right;   CATARACT EXTRACTION W/PHACO Left 01/08/2019   Procedure: CATARACT EXTRACTION PHACO AND INTRAOCULAR LENS PLACEMENT ([place]) LEFT;  Surgeon: Leandrew Koyanagi, MD;  Location: [place];  Service: Ophthalmology;  Laterality: Left;  leave arrival at 10:30  Total Time: 00:43.0 Total Equivalent Power: 14.4% Cumulative Dissipated Energy: 6.23   DRESSING CHANGE UNDER ANESTHESIA Right 05/08/2019   Procedure: DRESSING CHANGE UNDER ANESTHESIA;  Surgeon: Benjamine Sprague, DO;  Location: [facility];  Service: General;  Laterality: Right;   INCISION AND DRAINAGE ABSCESS Right 05/06/2019   Procedure: INCISION AND DRAINAGE ABSCESS, RIGHT;  Surgeon: Benjamine Sprague, DO;  Location: [facility];  Service: General;  Laterality: Right;     Current Outpatient Medications  Medication Sig Dispense Refill   albuterol (VENTOLIN HFA) 108 (90  Base) MCG/ACT inhaler INHALE 2 PUFFS INTO THE LUNGS EVERY 6 HOURS AS NEEDED FOR WHEEZING OR SHORTNESS OF BREATH. 8.5 g 0   Alirocumab (PRALUENT) 75 MG/ML SOAJ INJECT 75 MG INTO THE SKIN EVERY 14 DAYSAS DIRECTED 2 mL 0   amLODipine (NORVASC) 5 MG tablet TAKE 1 TABLET BY MOUTH ONCE A DAY 30 tablet 0   aspirin 81 MG chewable  tablet Chew 1 tablet (81 mg total) by mouth daily.     carvedilol (COREG) 3.125 MG tablet TAKE 1 TABLET BY MOUTH TWICE A DAY 180 tablet 1   ibuprofen (ADVIL) 800 MG tablet Take 1 tablet (800 mg total) by mouth every 8 (eight) hours as needed for mild pain or moderate pain. 30 tablet 0   losartan (COZAAR) 100 MG tablet TAKE 1 TABLET BY MOUTH ONCE A DAY 90 tablet 3   omeprazole (PRILOSEC) 20 MG capsule TAKE 1 CAPSULE BY MOUTH TWICE DAILY BEFORE MEALS 60 capsule 0   rosuvastatin (CRESTOR) 40 MG tablet TAKE 1 TABLET BY MOUTH ONCE A DAY 90 tablet 3   SYMBICORT 160-4.5 MCG/ACT inhaler INHALE 1 PUFF INTO THE LUNGS 2 TIMES DAILY. RINSE MOUTH AFTER USE. 10.2 g 0   No current facility-administered medications for this visit.    Allergies:   Penicillins    Social History:  The patient  reports that she has been smoking cigarettes. She has a 10.50 pack-year smoking history. She has never used smokeless tobacco. She reports that she does not currently use alcohol after a past usage of about 5.0 standard drinks of alcohol per week. She reports that she does not use drugs.   Family History:  The patient's family history includes CAD in her mother; Colon cancer in her mother.    ROS:  Please see the history of present illness.   Otherwise, review of systems are positive for none.   All other systems are reviewed and negative.    PHYSICAL EXAM: VS:  BP 110/78 (BP Location: Left Arm, Patient Position: Sitting, Cuff Size: Normal)   Pulse 80   Ht 5\' 4"  (1.626 m)   Wt 181 lb 8 oz (82.3 kg)   SpO2 93%   BMI 31.15 kg/m  , BMI Body mass index is 31.15 kg/m. GEN: Well nourished, well developed, in no acute distress  HEENT: normal  Neck: no JVD, carotid bruits, or masses Cardiac: RRR; no rubs, or gallops,no edema .  2 /6 systolic murmur in the aortic area which is early peaking. Respiratory:  clear to auscultation bilaterally, normal work of breathing GI: soft, nontender, nondistended, + BS MS: no  deformity or atrophy  Skin: warm and dry, no rash Neuro:  Strength and sensation are intact Psych: euthymic mood, full affect    EKG:  EKG is ordered today. EKG showed normal sinus rhythm with low voltage.   Recent Labs: No results found for requested labs within last 365 days.    Lipid Panel    Component Value Date/Time   CHOL 155 08/09/2020 1303   CHOL 148 06/13/2019 0951   TRIG 78 08/09/2020 1303   HDL 77 08/09/2020 1303   HDL 78 06/13/2019 0951   CHOLHDL 2.0 08/09/2020 1303   VLDL 16 08/09/2020 1303   LDLCALC 62 08/09/2020 1303   LDLCALC 53 06/13/2019 0951   LDLDIRECT 60.5 08/09/2020 1303      Wt Readings from Last 3 Encounters:  03/22/22 181 lb 8 oz (82.3 kg)  07/14/21 183 lb 6 oz (83.2 kg)  01/14/21  186 lb 8 oz (84.6 kg)        ASSESSMENT AND PLAN:  1.  Coronary artery disease involving native coronary arteries without angina: She is overall doing very well.  Continue medical therapy.  Continue aspirin indefinitely.  2. Ischemic cardiomyopathy with ejection fraction of 50%. Continue treatment with  small dose carvedilol and losartan.  I requested routine labs today.  Given reported shortness of breath, I requested an echocardiogram as her ejection fraction has not been checked since 2017.  3. Hyperlipidemia: Continue Praluent and rosuvastatin.  I requested fasting lipid profile and liver profile.  4. Tobacco use: She cut down but has not been able to quit completely.  I discussed with her the importance of smoking cessation.  5.  Essential hypertension: Blood pressure is now well controlled on small dose carvedilol, amlodipine and losartan.  6.  Cardiac murmur suggestive of aortic sclerosis.  I requested an echocardiogram.    Disposition:   FU with me in 6 months.     Signed,  Kathlyn Sacramento, MD  03/22/2022 9:32 AM    [place]

## 2022-04-05 ENCOUNTER — Other Ambulatory Visit: Payer: Self-pay | Admitting: Cardiovascular Disease

## 2022-04-07 ENCOUNTER — Other Ambulatory Visit: Payer: Self-pay | Admitting: Cardiovascular Disease

## 2022-05-16 ENCOUNTER — Ambulatory Visit: Payer: 59 | Attending: Cardiovascular Disease

## 2022-05-23 ENCOUNTER — Ambulatory Visit: Payer: 59

## 2022-06-19 ENCOUNTER — Other Ambulatory Visit: Payer: Self-pay | Admitting: Cardiovascular Disease

## 2022-07-06 ENCOUNTER — Ambulatory Visit: Payer: 59

## 2022-08-18 ENCOUNTER — Ambulatory Visit: Payer: 59 | Attending: Cardiovascular Disease

## 2022-08-18 DIAGNOSIS — R0602 Shortness of breath: Secondary | ICD-10-CM | POA: Diagnosis not present

## 2022-08-19 LAB — ECHOCARDIOGRAM COMPLETE
AR max vel: 3.93 cm2
AV Area VTI: 3.93 cm2
AV Area mean vel: 3.71 cm2
AV Mean grad: 2 mmHg
AV Peak grad: 4.3 mmHg
Ao pk vel: 1.03 m/s
Area-P 1/2: 3.65 cm2
Calc EF: 59.6 %
S' Lateral: 2.9 cm
Single Plane A2C EF: 59.5 %
Single Plane A4C EF: 58.9 %

## 2022-08-21 ENCOUNTER — Other Ambulatory Visit: Payer: Self-pay | Admitting: Cardiovascular Disease

## 2022-09-07 ENCOUNTER — Other Ambulatory Visit: Payer: Self-pay | Admitting: Cardiovascular Disease

## 2022-09-18 ENCOUNTER — Other Ambulatory Visit: Payer: Self-pay | Admitting: Cardiovascular Disease

## 2022-09-22 ENCOUNTER — Ambulatory Visit: Payer: 59 | Attending: Cardiovascular Disease | Admitting: Cardiovascular Disease

## 2022-09-22 ENCOUNTER — Encounter: Payer: Self-pay | Admitting: Cardiovascular Disease

## 2022-09-22 VITALS — BP 132/82 | HR 54 | Ht 64.0 in | Wt 178.5 lb

## 2022-09-22 DIAGNOSIS — I10 Essential (primary) hypertension: Secondary | ICD-10-CM | POA: Diagnosis not present

## 2022-09-22 DIAGNOSIS — E785 Hyperlipidemia, unspecified: Secondary | ICD-10-CM

## 2022-09-22 DIAGNOSIS — M79605 Pain in left leg: Secondary | ICD-10-CM

## 2022-09-22 DIAGNOSIS — I2510 Atherosclerotic heart disease of native coronary artery without angina pectoris: Secondary | ICD-10-CM

## 2022-09-22 DIAGNOSIS — M79604 Pain in right leg: Secondary | ICD-10-CM

## 2022-09-22 DIAGNOSIS — Z720 Tobacco use: Secondary | ICD-10-CM

## 2022-09-22 NOTE — Progress Notes (Signed)
Cardiology Office Note   Date:  09/22/2022   ID:  Nikida Dutchover, DOB 1956-04-05, MRN [id number]  PCP:  Pcp, No  Cardiologist:   Lorine Bears, MD   Chief Complaint  Patient presents with   Follow-up    6 Month f/u c/o bilateral leg pain. Meds reviewed verbally with pt.       History of Present Illness: Yoni Sako is a 67 y.o. female who presents for a follow-up visit regarding coronary artery disease and ischemic cardiomyopathy. She presented in June of 2017 with a small non-ST elevation myocardial infarction. She underwent cardiac catheterization which showed severe proximal and mid LAD disease. No other obstructive disease. She underwent successful angioplasty and 2 overlapped drug-eluting stent placement to the LAD without complications. Ejection fraction on LV gram was 30-35% but improved an echocardiogram before hospital discharge to 50%. She has other chronic medical conditions that include severe hyperlipidemia, essential hypertension, COPD and tobacco use. She had lower extremity arterial Doppler in 2019 which showed normal ABI.  She has been doing reasonably well overall with no chest pain.  She reports shortness of breath but she thinks it is due to her continued tobacco use.  She smokes few cigarettes a day.  She reports worsening bilateral leg pain with walking especially on the right side.  The pain starts in the thigh area and goes down to her calf.  She has some discomfort at rest especially when she is changing position at night.  Past Medical History:  Diagnosis Date   CAD (coronary artery disease)    a. NSTEMI 10/03/15; b. cardiac cath 10/04/15: LM nl, ost-pLAD 90% s/p PCI/DES, mLAD 80% s/p PCI/DES, mRCA 30%, D1,2,3 nl, p-mLCX 30%, OM1 &2 min irregs, OM3 normal, EF 30-35%, sev HK of mid-distal ant, apical, inf wall, mod elevated LVEDP   Chronic bronchitis (HCC)    Chronic cough    COPD (chronic obstructive pulmonary disease) (HCC)    Dysrhythmia     GERD (gastroesophageal reflux disease)    Heart murmur    HLD (hyperlipidemia)    Ischemic cardiomyopathy    a. echo 10/05/15: EF 50%, mild HK of anteroseptal and anterior wall, nl LV diastolic fxn   Myocardial infarction (HCC) 09/2015   Status post primary angioplasty with coronary stent    Tobacco abuse    Wears dentures    full upper and lower    Past Surgical History:  Procedure Laterality Date   CARDIAC CATHETERIZATION N/A 10/04/2015   Procedure: Left Heart Cath and Coronary Angiography;  Surgeon: Iran Ouch, MD;  Location: [facility];  Service: Cardiovascular;  Laterality: N/A;   CARDIAC CATHETERIZATION N/A 10/04/2015   Procedure: Coronary Stent Intervention;  Surgeon: Iran Ouch, MD;  Location: [facility] INVAS[facility] CV [facility];  Service: Cardiovascular;  Laterality: N/A;   CATARACT EXTRACTION W/PHACO Right 12/18/2018   Procedure: CATARACT EXTRACTION PHACO AND INTRAOCULAR LENS PLACEMENT (IOC)  RIGHT;  Surgeon: Lockie Mola, MD;  Location: [facility];  Service: Ophthalmology;  Laterality: Right;   CATARACT EXTRACTION W/PHACO Left 01/08/2019   Procedure: CATARACT EXTRACTION PHACO AND INTRAOCULAR LENS PLACEMENT (IOC) LEFT;  Surgeon: Lockie Mola, MD;  Location: [facility];  Service: Ophthalmology;  Laterality: Left;  leave arrival at 10:30  Total Time: 00:43.0 Total Equivalent Power: 14.4% Cumulative Dissipated Energy: 6.23   DRESSING CHANGE UNDER ANESTHESIA Right 05/08/2019   Procedure: DRESSING CHANGE UNDER ANESTHESIA;  Surgeon: Sung Amabile, DO;  Location: [facility] ORS;  Service: General;  Laterality:  Right;   INCISION AND DRAINAGE ABSCESS Right 05/06/2019   Procedure: INCISION AND DRAINAGE ABSCESS, RIGHT;  Surgeon: Sung Amabile, DO;  Location: [facility] ORS;  Service: General;  Laterality: Right;     Current Outpatient Medications  Medication Sig Dispense Refill   albuterol (VENTOLIN HFA) 108 (90 Base) MCG/ACT inhaler INHALE 2 PUFFS INTO THE LUNGS EVERY  6 HOURS AS NEEDED FOR WHEEZING OR SHORTNESS OF BREATH. 8.5 g 0   Alirocumab (PRALUENT) 75 MG/ML SOAJ INJECT 75 MG INTO THE SKIN EVERY 14 DAYSAS DIRECTED 2 mL 5   amLODipine (NORVASC) 5 MG tablet TAKE ONE TABLET BY MOUTH ONCE A DAY 30 tablet 1   aspirin 81 MG chewable tablet Chew 1 tablet (81 mg total) by mouth daily.     carvedilol (COREG) 3.125 MG tablet TAKE 1 TABLET BY MOUTH TWICE A DAY 180 tablet 1   ibuprofen (ADVIL) 800 MG tablet Take 1 tablet (800 mg total) by mouth every 8 (eight) hours as needed for mild pain or moderate pain. 30 tablet 0   losartan (COZAAR) 100 MG tablet TAKE ONE TABLET BY MOUTH ONCE A DAY 90 tablet 0   omeprazole (PRILOSEC) 20 MG capsule TAKE 1 CAPSULE BY MOUTH TWICE DAILY BEFORE MEALS 60 capsule 4   rosuvastatin (CRESTOR) 40 MG tablet TAKE ONE TABLET BY MOUTH ONCE A DAY 90 tablet 3   SYMBICORT 160-4.5 MCG/ACT inhaler INHALE 1 PUFF INTO THE LUNGS 2 TIMES DAILY. RINSE MOUTH AFTER USE. 10.2 g 0   No current facility-administered medications for this visit.    Allergies:   Penicillins    Social History:  The patient  reports that she has been smoking cigarettes. She has a 10.50 pack-year smoking history. She has never used smokeless tobacco. She reports that she does not currently use alcohol after a past usage of about 5.0 standard drinks of alcohol per week. She reports that she does not use drugs.   Family History:  The patient's family history includes CAD in her mother; Colon cancer in her mother.    ROS:  Please see the history of present illness.   Otherwise, review of systems are positive for none.   All other systems are reviewed and negative.    PHYSICAL EXAM: VS:  BP 132/82 (BP Location: Left Arm, Patient Position: Sitting, Cuff Size: Normal)   Pulse (!) 54   Ht 5\' 4"  (1.626 m)   Wt 178 lb 8 oz (81 kg)   SpO2 97%   BMI 30.64 kg/m  , BMI Body mass index is 30.64 kg/m. GEN: Well nourished, well developed, in no acute distress  HEENT: normal  Neck:  no JVD, carotid bruits, or masses Cardiac: RRR; no rubs, or gallops,no edema .  2 /6 systolic murmur in the aortic area which is early peaking. Respiratory:  clear to auscultation bilaterally, normal work of breathing GI: soft, nontender, nondistended, + BS MS: no deformity or atrophy  Skin: warm and dry, no rash Neuro:  Strength and sensation are intact Psych: euthymic mood, full affect Vascular: Femoral pulses +1 bilaterally.  Distal pulses are faint.    EKG:  EKG is ordered today. EKG showed sinus bradycardia with low voltage.  No significant ST changes.   Recent Labs: 03/22/2022: ALT 19; BUN 15; Creatinine, Ser 1.01; Hemoglobin 13.6; Platelets 268; Potassium 4.2; Sodium 141    Lipid Panel    Component Value Date/Time   CHOL 132 03/22/2022 1014   CHOL 148 06/13/2019 0951   TRIG 89 03/22/2022  1014   HDL 67 03/22/2022 1014   HDL 78 06/13/2019 0951   CHOLHDL 2.0 03/22/2022 1014   VLDL 18 03/22/2022 1014   LDLCALC 47 03/22/2022 1014   LDLCALC 53 06/13/2019 0951   LDLDIRECT 60.5 08/09/2020 1303      Wt Readings from Last 3 Encounters:  09/22/22 178 lb 8 oz (81 kg)  03/22/22 181 lb 8 oz (82.3 kg)  07/14/21 183 lb 6 oz (83.2 kg)        ASSESSMENT AND PLAN:  1.  Coronary artery disease involving native coronary arteries without angina: She is overall doing very well.  Continue medical therapy.  Continue aspirin indefinitely.  2. Ischemic cardiomyopathy with ejection fraction of 50%.  She had an echocardiogram done last month which showed normal LV systolic function.  3. Hyperlipidemia: Continue Praluent and rosuvastatin.  Most recent lipid profile in November showed an LDL of 47.  4. Tobacco use: She cut down but has not been able to quit completely.  I discussed with her the importance of smoking cessation.  5.  Essential hypertension: Her blood pressure is well-controlled.  6.  Cardiac murmur suggestive of aortic sclerosis.  Recent echocardiogram showed no  significant aortic stenosis.  7.  Bilateral leg claudication worse on the right side: Abnormal pulses bilaterally with possible inflow disease.  I requested lower extremity arterial Doppler.    Disposition:   FU with me in 6 months.     Signed,  Lorine Bears, MD  09/22/2022 11:11 AM    [facility]

## 2022-09-22 NOTE — Patient Instructions (Signed)
Medication Instructions:  No changes *If you need a refill on your cardiac medications before your next appointment, please call your pharmacy*   Lab Work: None ordered If you have labs (blood work) drawn today and your tests are completely normal, you will receive your results only by: MyChart Message (if you have MyChart) OR A paper copy in the mail If you have any lab test that is abnormal or we need to change your treatment, we will call you to review the results.   Testing/Procedures: Your physician has requested that you have a lower extremity segmental doppler.   Follow-Up: At [facility], you and your health needs are our priority.  As part of our continuing mission to provide you with exceptional heart care, we have created designated Provider Care Teams.  These Care Teams include your primary Cardiologist (physician) and Advanced Practice Providers (APPs -  Physician Assistants and Nurse Practitioners) who all work together to provide you with the care you need, when you need it.  We recommend signing up for the patient portal called "MyChart".  Sign up information is provided on this After Visit Summary.  MyChart is used to connect with patients for Virtual Visits (Telemedicine).  Patients are able to view lab/test results, encounter notes, upcoming appointments, etc.  Non-urgent messages can be sent to your provider as well.   To learn more about what you can do with MyChart, go to [email or web address].    Your next appointment:   6 month(s)  Provider:   You may see Muhammad Arida, MD or one of the following Advanced Practice Providers on your designated Care Team:   Christopher Berge, NP Ryan Dunn, PA-C Cadence Furth, PA-C Sheri Hammock, NP    

## 2022-10-03 ENCOUNTER — Other Ambulatory Visit: Payer: Self-pay | Admitting: Cardiovascular Disease

## 2022-10-20 ENCOUNTER — Other Ambulatory Visit: Payer: Self-pay | Admitting: Cardiovascular Disease

## 2022-10-20 DIAGNOSIS — I739 Peripheral vascular disease, unspecified: Secondary | ICD-10-CM

## 2022-11-06 ENCOUNTER — Other Ambulatory Visit: Payer: Self-pay | Admitting: Cardiovascular Disease

## 2022-11-10 ENCOUNTER — Ambulatory Visit: Payer: 59 | Attending: Cardiovascular Disease

## 2022-11-10 DIAGNOSIS — M79604 Pain in right leg: Secondary | ICD-10-CM | POA: Diagnosis not present

## 2022-11-10 DIAGNOSIS — M79605 Pain in left leg: Secondary | ICD-10-CM | POA: Diagnosis not present

## 2022-11-10 DIAGNOSIS — I739 Peripheral vascular disease, unspecified: Secondary | ICD-10-CM

## 2022-11-10 LAB — VAS US ABI WITH/WO TBI
Left ABI: 0.98
Right ABI: 0.98

## 2022-11-13 ENCOUNTER — Telehealth: Payer: Self-pay | Admitting: Cardiovascular Disease

## 2022-11-13 NOTE — Telephone Encounter (Signed)
Daughter calling asking what is the correct dose for her mother to over the counter Ibuprofen. Daughter advised to follow the recommended dosing on the bottle. Daughter verbalizes understanding.

## 2022-11-13 NOTE — Telephone Encounter (Signed)
Pt c/o medication issue:  1. Name of Medication:   ibuprofen (ADVIL) 800 MG tablet    2. How are you currently taking this medication (dosage and times per day)?   Take 1 tablet (800 mg total) by mouth every 8 (eight) hours as needed for mild pain or moderate pain.    3. Are you having a reaction (difficulty breathing--STAT)? No   4. What is your medication issue?  Pt states that she has been taking the wrong dosage of ibuprofen. Pt's daughter wanting to know what to the correct dosage is that she needs to take. Please advise.

## 2022-12-01 ENCOUNTER — Other Ambulatory Visit: Payer: Self-pay | Admitting: Cardiovascular Disease

## 2022-12-11 ENCOUNTER — Other Ambulatory Visit: Payer: Self-pay | Admitting: Cardiovascular Disease

## 2023-02-22 ENCOUNTER — Other Ambulatory Visit: Payer: Self-pay | Admitting: Cardiovascular Disease

## 2023-02-26 ENCOUNTER — Other Ambulatory Visit: Payer: Self-pay | Admitting: Cardiovascular Disease

## 2023-03-02 ENCOUNTER — Other Ambulatory Visit: Payer: Self-pay | Admitting: Cardiovascular Disease

## 2023-03-28 ENCOUNTER — Ambulatory Visit: Payer: 59 | Admitting: Cardiovascular Disease

## 2023-04-10 ENCOUNTER — Ambulatory Visit: Payer: 59 | Attending: Medical | Admitting: Medical

## 2023-04-10 ENCOUNTER — Encounter: Payer: Self-pay | Admitting: Medical

## 2023-04-10 VITALS — BP 110/57 | HR 59 | Ht 64.0 in | Wt 181.6 lb

## 2023-04-10 DIAGNOSIS — E782 Mixed hyperlipidemia: Secondary | ICD-10-CM

## 2023-04-10 DIAGNOSIS — Z79899 Other long term (current) drug therapy: Secondary | ICD-10-CM

## 2023-04-10 DIAGNOSIS — M79605 Pain in left leg: Secondary | ICD-10-CM

## 2023-04-10 DIAGNOSIS — I255 Ischemic cardiomyopathy: Secondary | ICD-10-CM

## 2023-04-10 DIAGNOSIS — Z720 Tobacco use: Secondary | ICD-10-CM

## 2023-04-10 DIAGNOSIS — I2510 Atherosclerotic heart disease of native coronary artery without angina pectoris: Secondary | ICD-10-CM

## 2023-04-10 DIAGNOSIS — M79604 Pain in right leg: Secondary | ICD-10-CM

## 2023-04-10 NOTE — Patient Instructions (Signed)
Medication Instructions:  Your physician recommends that you continue on your current medications as directed. Please refer to the Current Medication list given to you today.   *If you need a refill on your cardiac medications before your next appointment, please call your pharmacy*   Lab Work: Your provider would like for you to have following labs drawn today (lipid, CMP, CBC, TSH, direct LDL).     Testing/Procedures: No test ordered today    Follow-Up: At [facility], you and your health needs are our priority.  As part of our continuing mission to provide you with exceptional heart care, we have created designated Provider Care Teams.  These Care Teams include your primary Cardiologist (physician) and Advanced Practice Providers (APPs -  Physician Assistants and Nurse Practitioners) who all work together to provide you with the care you need, when you need it.  We recommend signing up for the patient portal called "MyChart".  Sign up information is provided on this After Visit Summary.  MyChart is used to connect with patients for Virtual Visits (Telemedicine).  Patients are able to view lab/test results, encounter notes, upcoming appointments, etc.  Non-urgent messages can be sent to your provider as well.   To learn more about what you can do with MyChart, go to [email or web address].    Your next appointment:   6 month(s)  Provider:   Terrilee Croak, PA-C

## 2023-04-10 NOTE — Progress Notes (Signed)
Cardiology Office Note:    Date:  04/10/2023   ID:  [name], DOB Sep 03, 1955, MRN [id number]  PCP:  Pcp, No  [facility] Cardiologist:  Lorine Bears, MD  [facility] Electrophysiologist:  None   Referring MD: No ref. provider found   Chief Complaint: 6 month follow-up  History of Present Illness:    [name] is a 67 y.o. female with a hx of CAD s/p PCI/DES x2 LAD 2018, ischemic cardiomyopathy with improvement of EF, hyperlipidemia, hypertension, COPD and tobacco use who is being seen for 57-month follow-up.  Patient presented in June 2017 with small non-STEMI.  She underwent cardiac cath which showed severe proximal and mid LAD disease.  No other obstructive disease was noted.  She underwent successful angioplasty and 2 overlapping drug-eluting stent placement to the LAD without complications.  EF was noted to be 30 to 35%, but improved on echocardiogram before hospital discharge to 50%.  She had lower extremity arterial Dopplers in 2019 which showed normal ABIs.  Patient was last seen in May 2024 and was overall doing well from a cardiac perspective.  She reported bilateral leg claudication and lower extremity Dopplers were ordered, which were normal.   Today, the patient reports she is overall doing well. She denies chest pain. She reports unchanged shortness of breath. She smokes 4 cigarettes daily. She denies lower leg edema, orthopnea, pnd, lightheadedness or dizziness. She does not formal activity, it's hard in the cold. She uses a bike in the cold.   Past Medical History:  Diagnosis Date   CAD (coronary artery disease)    a. NSTEMI 10/03/15; b. cardiac cath 10/04/15: LM nl, ost-pLAD 90% s/p PCI/DES, mLAD 80% s/p PCI/DES, mRCA 30%, D1,2,3 nl, p-mLCX 30%, OM1 &2 min irregs, OM3 normal, EF 30-35%, sev HK of mid-distal ant, apical, inf wall, mod elevated LVEDP   Chronic bronchitis (HCC)    Chronic cough    COPD (chronic obstructive pulmonary disease) (HCC)     Dysrhythmia    GERD (gastroesophageal reflux disease)    Heart murmur    HLD (hyperlipidemia)    Ischemic cardiomyopathy    a. echo 10/05/15: EF 50%, mild HK of anteroseptal and anterior wall, nl LV diastolic fxn   Myocardial infarction (HCC) 09/2015   Status post primary angioplasty with coronary stent    Tobacco abuse    Wears dentures    full upper and lower    Past Surgical History:  Procedure Laterality Date   CARDIAC CATHETERIZATION N/A 10/04/2015   Procedure: Left Heart Cath and Coronary Angiography;  Surgeon: Iran Ouch, MD;  Location: [facility];  Service: Cardiovascular;  Laterality: N/A;   CARDIAC CATHETERIZATION N/A 10/04/2015   Procedure: Coronary Stent Intervention;  Surgeon: Iran Ouch, MD;  Location: [facility];  Service: Cardiovascular;  Laterality: N/A;   CATARACT EXTRACTION W/PHACO Right 12/18/2018   Procedure: CATARACT EXTRACTION PHACO AND INTRAOCULAR LENS PLACEMENT (IOC)  RIGHT;  Surgeon: Lockie Mola, MD;  Location: [facility];  Service: Ophthalmology;  Laterality: Right;   CATARACT EXTRACTION W/PHACO Left 01/08/2019   Procedure: CATARACT EXTRACTION PHACO AND INTRAOCULAR LENS PLACEMENT (IOC) LEFT;  Surgeon: Lockie Mola, MD;  Location: [facility];  Service: Ophthalmology;  Laterality: Left;  leave arrival at 10:30  Total Time: 00:43.0 Total Equivalent Power: 14.4% Cumulative Dissipated Energy: 6.23   DRESSING CHANGE UNDER ANESTHESIA Right 05/08/2019   Procedure: DRESSING CHANGE UNDER ANESTHESIA;  Surgeon: Sung Amabile, DO;  Location: [facility] ORS;  Service: General;  Laterality: Right;   INCISION AND DRAINAGE ABSCESS Right 05/06/2019   Procedure: INCISION AND DRAINAGE ABSCESS, RIGHT;  Surgeon: Sung Amabile, DO;  Location: [facility] OR[facility];  Service: General;  Laterality: Right;    Current Medications: Current Meds  Medication Sig   albuterol (VENTOLIN HFA) 108 (90 Base) MCG/ACT inhaler INHALE 2 PUFFS INTO THE LUNGS  EVERY 6 HOURS AS NEEDED FOR WHEEZING OR SHORTNESS OF BREATH.   Alirocumab (PRALUENT) 75 MG/ML SOAJ INJECT 75 MG INTO THE SKIN EVERY 14 DAYS AS DIRECTED   amLODipine (NORVASC) 5 MG tablet TAKE 1 TABLET BY MOUTH ONCE A DAY   aspirin 81 MG chewable tablet Chew 1 tablet (81 mg total) by mouth daily.   carvedilol (COREG) 3.125 MG tablet TAKE 1 TABLET BY MOUTH TWICE A DAY   ibuprofen (ADVIL) 800 MG tablet Take 1 tablet (800 mg total) by mouth every 8 (eight) hours as needed for mild pain or moderate pain.   losartan (COZAAR) 100 MG tablet TAKE ONE TABLET BY MOUTH ONCE A DAY   omeprazole (PRILOSEC) 20 MG capsule TAKE ONE CAPSULE BY MOUTH TWICE DAILY BEFORE MEALS   rosuvastatin (CRESTOR) 40 MG tablet TAKE ONE TABLET BY MOUTH ONCE A DAY   SYMBICORT 160-4.5 MCG/ACT inhaler INHALE 1 PUFF INTO THE LUNGS 2 TIMES DAILY. RINSE MOUTH AFTER USE.     Allergies:   Penicillins   Social History   Socioeconomic History   Marital status: Divorced    Spouse name: Not on file   Number of children: Not on file   Years of education: Not on file   Highest education level: Not on file  Occupational History   Not on file  Tobacco Use   Smoking status: Every Day    Current packs/day: 0.25    Average packs/day: 0.3 packs/day for 42.0 years (10.5 ttl pk-yrs)    Types: Cigarettes   Smokeless tobacco: Never   Tobacco comments:    for 50 years now, currently cut back to 4 or less/day  Vaping Use   Vaping status: Never Used  Substance and Sexual Activity   Alcohol use: Not Currently    Alcohol/week: 5.0 standard drinks of alcohol    Types: 3 Cans of beer, 2 Standard drinks or equivalent per week   Drug use: No   Sexual activity: Not on file  Other Topics Concern   Not on file  Social History Narrative   Lives at home with son and daughter-in-law. Very independent and active at baseline.   Social Determinants of Health   Financial Resource Strain: Not on file  Food Insecurity: Not on file  Transportation  Needs: Not on file  Physical Activity: Not on file  Stress: Not on file  Social Connections: Not on file     Family History: The patient's family history includes CAD in her mother; Colon cancer in her mother.  ROS:   Please see the history of present illness.     All other systems reviewed and are negative.  EKGs/Labs/Other Studies Reviewed:    The following studies were reviewed today:  ABIs 10/2022 Summary:  Right: Resting right ankle-brachial index is within normal range. The  right toe-brachial index is abnormal.   Left: Resting left ankle-brachial index is within normal range. The left  toe-brachial index is abnormal.   Echo 07/2022 1. Left ventricular ejection fraction, by estimation, is 55 to 60%. The  left ventricle has normal function. The left ventricle has no regional  wall motion  abnormalities. Left ventricular diastolic parameters were  normal.   2. Right ventricular systolic function is normal. The right ventricular  size is normal. Tricuspid regurgitation signal is inadequate for assessing  PA pressure.   3. The mitral valve is normal in structure. No evidence of mitral valve  regurgitation. No evidence of mitral stenosis.   4. The aortic valve has an indeterminant number of cusps. Aortic valve  regurgitation is not visualized. No aortic stenosis is present.   5. The inferior vena cava is normal in size with greater than 50%  respiratory variability, suggesting right atrial pressure of 3 mmHg.   6. Consider definity on follow up studies   LHC 2017 Prox Cx to Mid Cx lesion, 30% stenosed. Mid RCA lesion, 30% stenosed. There is moderate to severe left ventricular systolic dysfunction. Mid LAD to Dist LAD lesion, 80% stenosed. Post intervention, there is a 0% residual stenosis. Ost LAD to Prox LAD lesion, 90% stenosed. Post intervention, there is a 0% residual stenosis.   1. Severe one-vessel coronary artery disease affecting mid and proximal/ostial LAD.  Otherwise mild disease in the left circumflex and right coronary artery. 2. Moderately to severely reduced LV systolic function with an ejection fraction of 30-35% with severe hypokinesis of mid to distal anterior, apical and distal inferior walls. 3. Moderately elevated left ventricular end-diastolic pressure 4. Successful angioplasty and drug-eluting stent placement to the mid as well as proximal LAD.   Recommendations: Dual antiplatelet therapy for at least one year. Aggressive treatment of risk factors, treatment of cardiomyopathy, smoking cessation and cardiac rehabilitation.    EKG:  EKG is ordered today.  The ekg ordered today demonstrates SB 59bpm, nonspecific T wave changes  Recent Labs: No results found for requested labs within last 365 days.  Recent Lipid Panel    Component Value Date/Time   CHOL 132 03/22/2022 1014   CHOL 148 06/13/2019 0951   TRIG 89 03/22/2022 1014   HDL 67 03/22/2022 1014   HDL 78 06/13/2019 0951   CHOLHDL 2.0 03/22/2022 1014   VLDL 18 03/22/2022 1014   LDLCALC 47 03/22/2022 1014   LDLCALC 53 06/13/2019 0951   LDLDIRECT 60.5 08/09/2020 1303    Physical Exam:    VS:  BP (!) 110/57 (BP Location: Left Arm, Patient Position: Sitting, Cuff Size: Normal)   Pulse (!) 59   Ht 5\' 4"  (1.626 m)   Wt 181 lb 9.6 oz (82.4 kg)   SpO2 97%   BMI 31.17 kg/m     Wt Readings from Last 3 Encounters:  04/10/23 181 lb 9.6 oz (82.4 kg)  09/22/22 178 lb 8 oz (81 kg)  03/22/22 181 lb 8 oz (82.3 kg)     GEN:  Well nourished, well developed in no acute distress HEENT: Normal NECK: No JVD; No carotid bruits LYMPHATICS: No lymphadenopathy CARDIAC: RRR, no murmurs, rubs, gallops RESPIRATORY:  Clear to auscultation without rales, wheezing or rhonchi  ABDOMEN: Soft, non-tender, non-distended MUSCULOSKELETAL:  No edema; No deformity  SKIN: Warm and dry NEUROLOGIC:  Alert and oriented x 3 PSYCHIATRIC:  Normal affect   ASSESSMENT:    1. Coronary artery disease  involving native coronary artery of native heart without angina pectoris   2. Medication management   3. Ischemic cardiomyopathy   4. Hyperlipidemia, mixed   5. Tobacco use   6. Pain in both lower extremities    PLAN:    In order of problems listed above:  CAD The patient denies chest pain. She has  chronic unchanged SOB. No ischemic work-up at this time. Continue Aspirin, Praluent,Coreg and Crestor. I will order CMET, CBC, TSH, and lipids.  ICM Echo 07/2022 showed LVEF 55-60%, no WMA. She is euvolemic on exam today. Continue Coreg 3.125mg BID and Losartan 100mg  daily.   HLD Re-check lipids today. LDL 47 last year. Continue Praluent and Crestor.   Tobacco use She is still smoking, cessation recommended.   Murmur No significant valvular disease on echo.   Bilateral leg claudication Normal ABIs on most recent imaging.   Disposition: Follow up in 6 month(s) with MD/APP    Signed, Donice Alperin David Stall, PA-C  04/10/2023 2:01 PM    [facility]

## 2023-04-11 LAB — COMPREHENSIVE METABOLIC PANEL
ALT: 14 [IU]/L (ref 0–32)
AST: 16 [IU]/L (ref 0–40)
Albumin: 4.1 g/dL (ref 3.9–4.9)
Alkaline Phosphatase: 92 [IU]/L (ref 44–121)
BUN/Creatinine Ratio: 15 (ref 12–28)
BUN: 15 mg/dL (ref 8–27)
Bilirubin Total: <0.2 mg/dL (ref 0.0–1.2)
CO2: 25 mmol/L (ref 20–29)
Calcium: 9.2 mg/dL (ref 8.7–10.3)
Chloride: 107 mmol/L — ABNORMAL HIGH (ref 96–106)
Creatinine, Ser: 0.98 mg/dL (ref 0.57–1.00)
Globulin, Total: 2.2 g/dL (ref 1.5–4.5)
Glucose: 89 mg/dL (ref 70–99)
Potassium: 4.5 mmol/L (ref 3.5–5.2)
Sodium: 145 mmol/L — ABNORMAL HIGH (ref 134–144)
Total Protein: 6.3 g/dL (ref 6.0–8.5)
eGFR: 62 mL/min/{1.73_m2} (ref >59)

## 2023-04-11 LAB — LIPID PANEL
Chol/HDL Ratio: 2 {ratio} (ref 0.0–4.4)
Cholesterol, Total: 127 mg/dL (ref 100–199)
HDL: 65 mg/dL (ref >39)
LDL Chol Calc (NIH): 37 mg/dL (ref 0–99)
Triglycerides: 154 mg/dL — ABNORMAL HIGH (ref 0–149)
VLDL Cholesterol Cal: 25 mg/dL (ref 5–40)

## 2023-04-11 LAB — CBC
Hematocrit: 40.1 % (ref 34.0–46.6)
Hemoglobin: 13.3 g/dL (ref 11.1–15.9)
MCH: 30.1 pg (ref 26.6–33.0)
MCHC: 33.2 g/dL (ref 31.5–35.7)
MCV: 91 fL (ref 79–97)
Platelets: 236 10*3/uL (ref 150–450)
RBC: 4.42 x10E6/uL (ref 3.77–5.28)
RDW: 13 % (ref 11.7–15.4)
WBC: 7.4 10*3/uL (ref 3.4–10.8)

## 2023-04-11 LAB — LDL CHOLESTEROL, DIRECT: LDL Direct: 38 mg/dL (ref 0–99)

## 2023-04-11 LAB — TSH: TSH: 3.42 u[IU]/mL (ref 0.450–4.500)

## 2023-04-23 ENCOUNTER — Other Ambulatory Visit: Payer: Self-pay | Admitting: Cardiovascular Disease

## 2023-05-25 ENCOUNTER — Other Ambulatory Visit: Payer: Self-pay | Admitting: Cardiovascular Disease

## 2023-05-26 ENCOUNTER — Other Ambulatory Visit: Payer: Self-pay | Admitting: Cardiovascular Disease

## 2023-08-07 ENCOUNTER — Telehealth: Payer: Self-pay | Admitting: Pharmacy Technician

## 2023-08-07 ENCOUNTER — Other Ambulatory Visit (HOSPITAL_COMMUNITY): Payer: Self-pay

## 2023-08-07 NOTE — Telephone Encounter (Signed)
 Pharmacy Patient Advocate Encounter  Received notification from [facility] that Prior Authorization for praluent has been APPROVED from 08/07/23 to 04/30/24. Ran test claim, Copay is $0.00- one month. This test claim was processed through [facility]- copay amounts may vary at other pharmacies due to pharmacy/plan contracts, or as the patient moves through the different stages of their insurance plan.   PA #/Case ID/Reference #: [id number]

## 2023-08-07 NOTE — Telephone Encounter (Signed)
 Pharmacy Patient Advocate Encounter   Received notification from CoverMyMeds that prior authorization for Praluent is required/requested.   Insurance verification completed.   The patient is insured through [employer] .   Per test claim: PA required; PA submitted to above mentioned insurance via CoverMyMeds Key/confirmation #/EOC [id number] Status is pending

## 2023-08-16 ENCOUNTER — Other Ambulatory Visit: Payer: Self-pay | Admitting: Cardiovascular Disease

## 2023-08-20 ENCOUNTER — Other Ambulatory Visit: Payer: Self-pay | Admitting: Cardiovascular Disease

## 2023-08-30 ENCOUNTER — Other Ambulatory Visit: Payer: Self-pay | Admitting: Cardiovascular Disease

## 2023-09-08 ENCOUNTER — Other Ambulatory Visit: Payer: Self-pay | Admitting: Cardiovascular Disease

## 2023-10-16 ENCOUNTER — Ambulatory Visit: Attending: Medical | Admitting: Medical

## 2023-10-16 ENCOUNTER — Encounter: Payer: Self-pay | Admitting: Medical

## 2023-10-16 VITALS — BP 100/83 | HR 53 | Ht 64.0 in | Wt 173.4 lb

## 2023-10-16 DIAGNOSIS — Z720 Tobacco use: Secondary | ICD-10-CM | POA: Diagnosis not present

## 2023-10-16 DIAGNOSIS — E782 Mixed hyperlipidemia: Secondary | ICD-10-CM | POA: Diagnosis not present

## 2023-10-16 DIAGNOSIS — J449 Chronic obstructive pulmonary disease, unspecified: Secondary | ICD-10-CM

## 2023-10-16 DIAGNOSIS — I2510 Atherosclerotic heart disease of native coronary artery without angina pectoris: Secondary | ICD-10-CM | POA: Diagnosis not present

## 2023-10-16 DIAGNOSIS — I255 Ischemic cardiomyopathy: Secondary | ICD-10-CM

## 2023-10-16 MED ORDER — SYMBICORT 160-4.5 MCG/ACT IN AERO: 2.0000 | INHALATION_SPRAY | Freq: Two times a day (BID) | RESPIRATORY_TRACT | 0 refills | Status: DC

## 2023-10-16 MED ORDER — ALBUTEROL SULFATE HFA 108 (90 BASE) MCG/ACT IN AERS: 2.0000 | INHALATION_SPRAY | Freq: Four times a day (QID) | RESPIRATORY_TRACT | 0 refills | Status: DC | PRN

## 2023-10-16 NOTE — Patient Instructions (Signed)
 Medication Instructions:  Your physician recommends that you continue on your current medications as directed. Please refer to the Current Medication list given to you today.    *If you need a refill on your cardiac medications before your next appointment, please call your pharmacy*  Lab Work: No labs ordered today   Testing/Procedures: No test ordered today   Follow-Up: At [facility], you and your health needs are our priority.  As part of our continuing mission to provide you with exceptional heart care, our providers are all part of one team.  This team includes your primary Cardiologist (physician) and Advanced Practice Providers or APPs (Physician Assistants and Nurse Practitioners) who all work together to provide you with the care you need, when you need it.  Your next appointment:   6 month(s)  Provider:   Antionette Kirks, MD or Cadence Gennaro Khat, PA-C

## 2023-10-16 NOTE — Progress Notes (Signed)
 Cardiology Office Note   Date:  10/16/2023  ID:  Melvern Stack, DOB [birth year], MRN [id number] PCP: Pcp, No  [facility] Providers Cardiologist:  Antionette Kirks, MD   History of Present Illness [name] is a 68 y.o. female  with a hx of CAD s/p PCI/DES x2 LAD 2018, ischemic cardiomyopathy with improvement of EF, hyperlipidemia, hypertension, COPD and tobacco use who is being seen for 63-month follow-up.   Patient presented in June 2017 with small non-STEMI.  She underwent cardiac cath which showed severe proximal and mid LAD disease.  No other obstructive disease was noted.  She underwent successful angioplasty and 2 overlapping drug-eluting stent placement to the LAD without complications.  EF was noted to be 30 to 35%, but improved on echocardiogram before hospital discharge to 50%.  She had lower extremity arterial Dopplers in 2019 which showed normal ABIs.   Patient was last seen in May 2024 and was overall doing well from a cardiac perspective.  She reported bilateral leg claudication and lower extremity Dopplers were ordered, which were normal.   The patient was last seen 04/2023 and was overall doing well.  Today, the patient is overall doing OK.  She denies chest pain. Breathing is a little short when it's hot outside. She does no formal activity. She denies lower leg edema, lightheadedness, dizziness, palpitations. She does not have PCP, and is not interested in seeing one now. She is needing an inhaler refill.  Studies Reviewed EKG Interpretation Date/Time:  Tuesday October 16 2023 13:48:57 EDT Ventricular Rate:  53 PR Interval:  164 QRS Duration:  76 QT Interval:  462 QTC Calculation: 433 R Axis:   57  Text Interpretation: Sinus bradycardia Low voltage QRS When compared with ECG of 10-Apr-2023 13:33, Premature atrial complexes are no longer Present Confirmed by Gennaro Khat, Avonte Sensabaugh ([id number]) on 10/16/2023 1:51:42 PM    ABIs 10/2022 Summary:  Right: Resting right  ankle-brachial index is within normal range. The  right toe-brachial index is abnormal.   Left: Resting left ankle-brachial index is within normal range. The left  toe-brachial index is abnormal.    Echo 07/2022 1. Left ventricular ejection fraction, by estimation, is 55 to 60%. The  left ventricle has normal function. The left ventricle has no regional  wall motion abnormalities. Left ventricular diastolic parameters were  normal.   2. Right ventricular systolic function is normal. The right ventricular  size is normal. Tricuspid regurgitation signal is inadequate for assessing  PA pressure.   3. The mitral valve is normal in structure. No evidence of mitral valve  regurgitation. No evidence of mitral stenosis.   4. The aortic valve has an indeterminant number of cusps. Aortic valve  regurgitation is not visualized. No aortic stenosis is present.   5. The inferior vena cava is normal in size with greater than 50%  respiratory variability, suggesting right atrial pressure of 3 mmHg.   6. Consider definity on follow up studies    LHC 2017 Prox Cx to Mid Cx lesion, 30% stenosed. Mid RCA lesion, 30% stenosed. There is moderate to severe left ventricular systolic dysfunction. Mid LAD to Dist LAD lesion, 80% stenosed. Post intervention, there is a 0% residual stenosis. Ost LAD to Prox LAD lesion, 90% stenosed. Post intervention, there is a 0% residual stenosis.   1. Severe one-vessel coronary artery disease affecting mid and proximal/ostial LAD. Otherwise mild disease in the left circumflex and right coronary artery. 2. Moderately to severely reduced LV systolic function with an ejection  fraction of 30-35% with severe hypokinesis of mid to distal anterior, apical and distal inferior walls. 3. Moderately elevated left ventricular end-diastolic pressure 4. Successful angioplasty and drug-eluting stent placement to the mid as well as proximal LAD.   Recommendations: Dual antiplatelet  therapy for at least one year. Aggressive treatment of risk factors, treatment of cardiomyopathy, smoking cessation and cardiac rehabilitation.  Physical Exam VS:  BP 100/83 (BP Location: Left Arm, Patient Position: Sitting, Cuff Size: Normal)   Pulse (!) 53   Ht 5' 4 (1.626 m)   Wt 173 lb 6.4 oz (78.7 kg)   SpO2 96%   BMI 29.76 kg/m    Wt Readings from Last 3 Encounters:  10/16/23 173 lb 6.4 oz (78.7 kg)  04/10/23 181 lb 9.6 oz (82.4 kg)  09/22/22 178 lb 8 oz (81 kg)    GEN: Well nourished, well developed in no acute distress NECK: No JVD; No carotid bruits CARDIAC: RRR, no murmurs, rubs, gallops RESPIRATORY:  Clear to auscultation without rales, wheezing or rhonchi  ABDOMEN: Soft, non-tender, non-distended EXTREMITIES:  No edema; No deformity   ASSESSMENT AND PLAN    CAD LHC in 2017 showed severe 1V CAD in the LAD, otherwise mild disease. The patient denies chest pain or shortness of breath.No further ischemic work-up. Continue ASA, Praluent , Coreg  and Crestor . Can update labs at follow-up.    ICM Echo 07/2022 showed LVEF 55-60%, no WMA. She is euvolemic on exam. Continue Coreg  3.125mg BID and Losartan  100mg  daily.   HLD LDL 47. Continue Praluent  and Crestor .   COPD Tobacco use I will refill her inhaler and refer her to pulmonology. She does not have a PCP and is not interested in finding one at this time.      Dispo: Follow-up in 6 months  Signed, Antwann Preziosi Rebekah Canada, PA-C

## 2023-11-06 ENCOUNTER — Other Ambulatory Visit: Payer: Self-pay | Admitting: Medical

## 2023-11-12 ENCOUNTER — Other Ambulatory Visit: Payer: Self-pay | Admitting: Medical

## 2023-11-12 NOTE — Telephone Encounter (Signed)
 Pt's pharmacy is requesting a refill on non cardiac medication symbicort  inhaler. Would Dr. Darron like to refill this non cardiac medication? Please address

## 2023-12-03 ENCOUNTER — Encounter: Payer: Self-pay | Admitting: Pulmonary Disease

## 2023-12-03 ENCOUNTER — Ambulatory Visit (INDEPENDENT_AMBULATORY_CARE_PROVIDER_SITE_OTHER): Admitting: Pulmonary Disease

## 2023-12-03 VITALS — BP 126/76 | HR 64 | Temp 97.9°F | Ht 64.0 in | Wt 174.8 lb

## 2023-12-03 DIAGNOSIS — F17210 Nicotine dependence, cigarettes, uncomplicated: Secondary | ICD-10-CM | POA: Diagnosis not present

## 2023-12-03 DIAGNOSIS — R0600 Dyspnea, unspecified: Secondary | ICD-10-CM

## 2023-12-03 DIAGNOSIS — R0989 Other specified symptoms and signs involving the circulatory and respiratory systems: Secondary | ICD-10-CM

## 2023-12-03 DIAGNOSIS — R062 Wheezing: Secondary | ICD-10-CM | POA: Diagnosis not present

## 2023-12-03 DIAGNOSIS — J449 Chronic obstructive pulmonary disease, unspecified: Secondary | ICD-10-CM

## 2023-12-03 DIAGNOSIS — I2510 Atherosclerotic heart disease of native coronary artery without angina pectoris: Secondary | ICD-10-CM

## 2023-12-03 MED ORDER — SYMBICORT 160-4.5 MCG/ACT IN AERO: 2.0000 | INHALATION_SPRAY | Freq: Two times a day (BID) | RESPIRATORY_TRACT | 11 refills | Status: AC

## 2023-12-03 MED ORDER — ALBUTEROL SULFATE HFA 108 (90 BASE) MCG/ACT IN AERS: 2.0000 | INHALATION_SPRAY | Freq: Four times a day (QID) | RESPIRATORY_TRACT | 6 refills | Status: AC | PRN

## 2023-12-03 NOTE — Progress Notes (Signed)
 Subjective:    Patient ID: [name], female    DOB: August 01, 1955, 68 y.o.   MRN: [id number]  Patient Care Team: Pcp, No as PCP - General Darron Deatrice LABOR, MD as PCP - Cardiology (Cardiology)  Chief Complaint  Patient presents with   Consult    Cough and wheezing.     BACKGROUND: Patient is a 68 year old current smoker with a history as noted below who is referred for evaluation and management of chronic obstructive pulmonary disease.  She is kindly referred by Cadence Furth, PA-C.  Patient has reportedly no primary care physician.  Patient previously had been evaluated by Dr. Laurance Flor last seen Dr. Flor on 07 August 2018 at that time noted to have a cough triggered by gastroesophageal reflux and was also noted that her COPD was stable.  Patient was lost to follow-up after that.  HPI Discussed the use of AI scribe software for clinical note transcription with the patient, who gave verbal consent to proceed.  History of Present Illness   [name] is a 69 year old female who presents with respiratory symptoms. She was referred by Alberta Fishman, PA-C, for evaluation of potential COPD.  She has no primary care physician.  She experiences shortness of breath, particularly in hot weather, along with significant congestion and occasional wheezing. She uses Symbicort , twice daily and has an emergency inhaler (albuterol ) at home, which she uses every six hours as needed.  She does endorse cough which is not particularly productive, when productive it is usually whitish to clear sputum.  She does note shortness of breath with exertion.  This has been of longstanding.  No change in the character of her symptoms.  She has a history of smoking, currently smoking less than half a pack per day, which is a reduction from her younger days when she smoked about half a pack daily. She has not quit smoking entirely.  Her past medical history includes a myocardial  infarction for which she received two stents. She continues to follow up with her cardiologist. No current chest pain or leg swelling.  No orthopnea or paroxysmal nocturnal dyspnea.  No chest pain.  She does not report any issues with sleep and feels rested upon waking.  She does have issues with gastroesophageal reflux however, symptoms are well-controlled with omeprazole  20 mg daily.  Has issues with chronic rhinitis but these have not been bothersome lately.  No weight loss or anorexia.  No GI symptoms.  She does not have significant occupational exposure.     Review of Systems A 10 point review of systems was performed and it is as noted above otherwise negative.   Past Medical History:  Diagnosis Date   CAD (coronary artery disease)    a. NSTEMI 10/03/15; b. cardiac cath 10/04/15: LM nl, ost-pLAD 90% s/p PCI/DES, mLAD 80% s/p PCI/DES, mRCA 30%, D1,2,3 nl, p-mLCX 30%, OM1 &2 min irregs, OM3 normal, EF 30-35%, sev HK of mid-distal ant, apical, inf wall, mod elevated LVEDP   Chronic bronchitis (HCC)    Chronic cough    COPD (chronic obstructive pulmonary disease) (HCC)    Dysrhythmia    GERD (gastroesophageal reflux disease)    Heart murmur    HLD (hyperlipidemia)    Ischemic cardiomyopathy    a. echo 10/05/15: EF 50%, mild HK of anteroseptal and anterior wall, nl LV diastolic fxn   Myocardial infarction (HCC) 09/2015   Status post primary angioplasty with coronary stent    Tobacco abuse  Wears dentures    full upper and lower    Past Surgical History:  Procedure Laterality Date   CARDIAC CATHETERIZATION N/A 10/04/2015   Procedure: Left Heart Cath and Coronary Angiography;  Surgeon: Deatrice DELENA Cage, MD;  Location: [facility];  Service: Cardiovascular;  Laterality: N/A;   CARDIAC CATHETERIZATION N/A 10/04/2015   Procedure: Coronary Stent Intervention;  Surgeon: Deatrice DELENA Cage, MD;  Location: [facility];  Service: Cardiovascular;  Laterality: N/A;   CATARACT  EXTRACTION W/PHACO Right 12/18/2018   Procedure: CATARACT EXTRACTION PHACO AND INTRAOCULAR LENS PLACEMENT (IOC)  RIGHT;  Surgeon: Mittie Gaskin, MD;  Location: [facility];  Service: Ophthalmology;  Laterality: Right;   CATARACT EXTRACTION W/PHACO Left 01/08/2019   Procedure: CATARACT EXTRACTION PHACO AND INTRAOCULAR LENS PLACEMENT (IOC) LEFT;  Surgeon: Mittie Gaskin, MD;  Location: [facility];  Service: Ophthalmology;  Laterality: Left;  leave arrival at 10:30  Total Time: 00:43.0 Total Equivalent Power: 14.4% Cumulative Dissipated Energy: 6.23   DRESSING CHANGE UNDER ANESTHESIA Right 05/08/2019   Procedure: DRESSING CHANGE UNDER ANESTHESIA;  Surgeon: Tye Millet, DO;  Location: [facility];  Service: General;  Laterality: Right;   INCISION AND DRAINAGE ABSCESS Right 05/06/2019   Procedure: INCISION AND DRAINAGE ABSCESS, RIGHT;  Surgeon: Tye Millet, DO;  Location: [facility];  Service: General;  Laterality: Right;    Patient Active Problem List   Diagnosis Date Noted   Cellulitis due to methicillin-resistant Staphylococcus aureus (MRSA) 05/08/2019   Cellulitis and abscess of upper extremity 05/08/2019   Cellulitis of axillary region    Cellulitis of right breast    Hyponatremia 05/04/2019   Moderate protein-calorie malnutrition (HCC) 05/04/2019   Chronic cough 02/09/2016   Trochanteric bursitis, right hip 02/09/2016   Status post insertion of drug-eluting stent into left anterior descending (LAD) artery 10/26/2015   Anxiety and depression 10/26/2015   CAD (coronary artery disease)    Ischemic cardiomyopathy    HLD (hyperlipidemia)    Tobacco abuse    NSTEMI (non-ST elevated myocardial infarction) (HCC) 10/03/2015    Family History  Problem Relation Age of Onset   CAD Mother    Colon cancer Mother     Social History   Tobacco Use   Smoking status: Every Day    Current packs/day: 0.25    Average packs/day: 0.3 packs/day for 53.6 years (13.4 ttl pk-yrs)     Types: Cigarettes    Start date: 21   Smokeless tobacco: Never   Tobacco comments:    for 50 years now, currently cut back to 4 or less/day  Substance Use Topics   Alcohol use: Not Currently    Alcohol/week: 5.0 standard drinks of alcohol    Types: 3 Cans of beer, 2 Standard drinks or equivalent per week    Allergies  Allergen Reactions   Penicillins Rash    Has patient had a PCN reaction causing immediate rash, facial/tongue/throat swelling, SOB or lightheadedness with hypotension: Yes Has patient had a PCN reaction causing severe rash involving mucus membranes or skin necrosis: No Has patient had a PCN reaction that required hospitalization No Has patient had a PCN reaction occurring within the last 10 years: No If all of the above answers are NO, then may proceed with Cephalosporin use.    Current Meds  Medication Sig   Alirocumab  (PRALUENT ) 75 MG/ML SOAJ INJECT 75 MG INTO THE SKIN EVERY 14 DAYS AS DIRECTED   amLODipine  (NORVASC ) 5 MG tablet TAKE ONE TABLET BY MOUTH ONCE  A DAY   aspirin  81 MG chewable tablet Chew 1 tablet (81 mg total) by mouth daily.   carvedilol  (COREG ) 3.125 MG tablet TAKE 1 TABLET BY MOUTH TWICE A DAY   ibuprofen  (ADVIL ) 800 MG tablet Take 1 tablet (800 mg total) by mouth every 8 (eight) hours as needed for mild pain or moderate pain.   losartan  (COZAAR ) 100 MG tablet TAKE ONE TABLET BY MOUTH ONCE A DAY   omeprazole  (PRILOSEC) 20 MG capsule TAKE ONE CAPSULE BY MOUTH TWICE DAILY BEFORE MEALS   rosuvastatin  (CRESTOR ) 40 MG tablet TAKE ONE TABLET BY MOUTH ONCE A DAY   [DISCONTINUED] albuterol  (VENTOLIN  HFA) 108 (90 Base) MCG/ACT inhaler Inhale 2 puffs into the lungs every 6 (six) hours as needed for wheezing or shortness of breath.   [DISCONTINUED] SYMBICORT  160-4.5 MCG/ACT inhaler Inhale 2 puffs into the lungs 2 (two) times daily.    Immunization History  Administered Date(s) Administered   Influenza,inj,Quad PF,6+ Mos 02/04/2018, 03/11/2019    Pneumococcal Polysaccharide-23 05/08/2019        Objective:     BP 126/76 (BP Location: Left Arm, Patient Position: Sitting, Cuff Size: Normal)   Pulse 64   Temp 97.9 F (36.6 C) (Oral)   Ht 5' 4 (1.626 m)   Wt 174 lb 12.8 oz (79.3 kg)   SpO2 99%   BMI 30.00 kg/m   SpO2: 99 %  GENERAL: Overweight woman, no acute distress, fully ambulatory, no conversational dyspnea. HEAD: Normocephalic, atraumatic.  EYES: Pupils equal, round, reactive to light.  No scleral icterus.  MOUTH: Edentulous, oral mucosa moist.  No thrush. NECK: Supple. No thyromegaly. Trachea midline. No JVD.  No adenopathy. PULMONARY: Good air entry bilaterally.  Coarse, rare end expiratory wheeze.SABRA CARDIOVASCULAR: S1 and S2. Regular rate and rhythm.  ABDOMEN: Protuberant, otherwise benign. MUSCULOSKELETAL: No joint deformity, no clubbing, no edema.  NEUROLOGIC: No overt focal deficit, no gait disturbance, speech is fluent. SKIN: Intact,warm,dry. PSYCH: Mood and behavior normal.  Reviewed available imaging, no chest imaging since 2021, no prior PFTs.      Assessment & Plan:     ICD-10-CM   1. COPD suggested by initial evaluation (HCC)  J44.9 Pulmonary function test    2. Coronary artery disease involving native coronary artery of native heart without angina pectoris  I25.10     3. Tobacco dependence due to cigarettes  F17.210 Ambulatory Referral for Lung Cancer Scre      Orders Placed This Encounter  Procedures   Ambulatory Referral for Lung Cancer Scre    Referral Priority:   Routine    Referral Type:   Consultation    Referral Reason:   Specialty Services Required    Number of Visits Requested:   1   Pulmonary function test    Standing Status:   Future    Expiration Date:   12/02/2024    Where should this test be performed?:   Outpatient Pulmonary    What type of PFT is being ordered?:   Full PFT    Meds ordered this encounter  Medications   SYMBICORT  160-4.5 MCG/ACT inhaler    Sig: Inhale  2 puffs into the lungs 2 (two) times daily.    Dispense:  10.2 g    Refill:  11    Needs appt for refills   albuterol  (VENTOLIN  HFA) 108 (90 Base) MCG/ACT inhaler    Sig: Inhale 2 puffs into the lungs every 6 (six) hours as needed for wheezing or shortness of breath.  Dispense:  8.5 g    Refill:  6    Needs appt for refills   Discussion:    Suspected chronic obstructive pulmonary disease (COPD) Suspected COPD with symptoms of dyspnea, congestion, and wheezing. Currently using Symbicort  inhaler. No emergency inhaler carried regularly. Potential need for stronger inhaler if COPD is confirmed and symptom control warrants. Pulmonary function tests will determine lung function and guide medication adjustments. - Order pulmonary function tests to assess lung function and confirm COPD diagnosis - Ensure refills for Symbicort  inhaler - Instruct to carry emergency inhaler (albuterol ) for as-needed use - Schedule follow-up appointment in two months to review test results and adjust treatment if necessary  General Health maintenance Recommended patient procure care of a primary care physician.  Tobacco use Continues to smoke less than half a pack per day, reduced from previous higher usage.  Counseled regards to discontinuation of smoking.  Total counseling time 3 to 5 minutes - Will enroll patient in lung cancer screening     Advised if symptoms do not improve or worsen, to please contact office for sooner follow up or seek emergency care.    I spent 45 minutes of dedicated to the care of this patient on the date of this encounter to include pre-visit review of records, face-to-face time with the patient discussing conditions above, post visit ordering of testing, clinical documentation with the electronic health record, making appropriate referrals as documented, and communicating necessary findings to members of the patients care team.   C. Leita Sanders, MD Advanced Bronchoscopy PCCM  [facility]    *This note was dictated using voice recognition software/Dragon.  Despite best efforts to proofread, errors can occur which can change the meaning. Any transcriptional errors that result from this process are unintentional and may not be fully corrected at the time of dictation.

## 2023-12-03 NOTE — Patient Instructions (Signed)
 VISIT SUMMARY:  Today, you were seen for respiratory symptoms, including shortness of breath, congestion, and occasional wheezing. We discussed your current use of inhalers and your smoking history. We also planned further tests to confirm a potential diagnosis of chronic obstructive pulmonary disease (COPD).  YOUR PLAN:  -SUSPECTED CHRONIC OBSTRUCTIVE PULMONARY DISEASE (COPD): COPD is a chronic lung disease that causes breathing difficulties. To confirm this diagnosis, we will conduct pulmonary function tests to assess your lung function. We will ensure you have refills for your Symbicort  inhaler and instruct you to carry your emergency inhaler (albuterol ) for as-needed use. We will review the test results and adjust your treatment plan during your follow-up appointment in two months.  -TOBACCO USE: You continue to smoke less than half a pack per day, which is a reduction from your previous higher usage. Reducing or quitting smoking is important for managing your respiratory symptoms and overall health.  INSTRUCTIONS:  Please complete the pulmonary function tests as ordered. Ensure you have refills for your Symbicort  inhaler and carry your emergency inhaler (albuterol ) with you for as-needed use. Schedule a follow-up appointment in two months to review your test results and adjust your treatment plan if necessary.

## 2024-01-31 ENCOUNTER — Encounter

## 2024-01-31 ENCOUNTER — Encounter: Payer: Self-pay | Admitting: Pulmonary Disease

## 2024-02-05 ENCOUNTER — Ambulatory Visit: Admitting: Pulmonary Disease

## 2024-02-07 ENCOUNTER — Encounter: Payer: Self-pay | Admitting: Pulmonary Disease

## 2024-04-14 ENCOUNTER — Encounter: Payer: Self-pay | Admitting: Medical

## 2024-04-14 ENCOUNTER — Ambulatory Visit: Admitting: Medical

## 2024-04-14 VITALS — BP 120/72 | HR 61 | Ht 64.0 in | Wt 178.0 lb

## 2024-04-14 DIAGNOSIS — Z720 Tobacco use: Secondary | ICD-10-CM | POA: Diagnosis not present

## 2024-04-14 DIAGNOSIS — E782 Mixed hyperlipidemia: Secondary | ICD-10-CM | POA: Diagnosis not present

## 2024-04-14 DIAGNOSIS — J449 Chronic obstructive pulmonary disease, unspecified: Secondary | ICD-10-CM

## 2024-04-14 DIAGNOSIS — I255 Ischemic cardiomyopathy: Secondary | ICD-10-CM | POA: Diagnosis not present

## 2024-04-14 DIAGNOSIS — Z79899 Other long term (current) drug therapy: Secondary | ICD-10-CM

## 2024-04-14 DIAGNOSIS — I2510 Atherosclerotic heart disease of native coronary artery without angina pectoris: Secondary | ICD-10-CM | POA: Diagnosis not present

## 2024-04-14 NOTE — Patient Instructions (Signed)
 Medication Instructions:  Your physician recommends that you continue on your current medications as directed. Please refer to the Current Medication list given to you today.    *If you need a refill on your cardiac medications before your next appointment, please call your pharmacy*  Lab Work: Your provider would like for you to have following labs drawn today Lipid, TSH, CBC, CMP, direct LDL.     Testing/Procedures: No test ordered today   Follow-Up: At [facility], you and your health needs are our priority.  As part of our continuing mission to provide you with exceptional heart care, our providers are all part of one team.  This team includes your primary Cardiologist (physician) and Advanced Practice Providers or APPs (Physician Assistants and Nurse Practitioners) who all work together to provide you with the care you need, when you need it.  Your next appointment:   6 month(s)  Provider:   Deatrice Cage, MD or Cadence Franchester, PA-C

## 2024-04-14 NOTE — Progress Notes (Signed)
 Cardiology Office Note   Date:  04/14/2024  ID:  Amiee Wiley, DOB 03/20/1956, MRN [id number] PCP: Pcp, No  [facility] Providers Cardiologist:  Deatrice Cage, MD   History of Present Illness [name] is a 68 y.o. female with a hx of CAD s/p PCI/DES x2 LAD 2018, ischemic cardiomyopathy with improvement of EF, hyperlipidemia, hypertension, COPD and tobacco use who is being seen for 9-month follow-up.   Patient presented in June 2017 with small non-STEMI.  She underwent cardiac cath which showed severe proximal and mid LAD disease.  No other obstructive disease was noted.  She underwent successful angioplasty and 2 overlapping drug-eluting stent placement to the LAD without complications.  EF was noted to be 30 to 35%, but improved on echocardiogram before hospital discharge to 50%.  She had lower extremity arterial Dopplers in 2019 which showed normal ABIs.  The patient was last seen 10/16/23 and was stable from a cardiac perspective.   Today, the patient is overall doing OK. She denies chest pain, SOB, lower lg edema, orthopea, pnd, palpitations, lightheadedness, dizziness. She has not found aPCP. She is still smoking 4 cigarettes daily.   Studies Reviewed EKG Interpretation Date/Time:  Monday April 14 2024 15:09:35 EST Ventricular Rate:  61 PR Interval:  158 QRS Duration:  76 QT Interval:  442 QTC Calculation: 444 R Axis:   67  Text Interpretation: Sinus rhythm with occasional Premature ventricular complexes Low voltage QRS When compared with ECG of 16-Oct-2023 13:48, Premature ventricular complexes are now Present Confirmed by Franchester, Rahkim Rabalais ([id number]) on 04/14/2024 3:16:14 PM    ABIs 10/2022 Summary:  Right: Resting right ankle-brachial index is within normal range. The  right toe-brachial index is abnormal.   Left: Resting left ankle-brachial index is within normal range. The left  toe-brachial index is abnormal.    Echo 07/2022 1. Left ventricular  ejection fraction, by estimation, is 55 to 60%. The  left ventricle has normal function. The left ventricle has no regional  wall motion abnormalities. Left ventricular diastolic parameters were  normal.   2. Right ventricular systolic function is normal. The right ventricular  size is normal. Tricuspid regurgitation signal is inadequate for assessing  PA pressure.   3. The mitral valve is normal in structure. No evidence of mitral valve  regurgitation. No evidence of mitral stenosis.   4. The aortic valve has an indeterminant number of cusps. Aortic valve  regurgitation is not visualized. No aortic stenosis is present.   5. The inferior vena cava is normal in size with greater than 50%  respiratory variability, suggesting right atrial pressure of 3 mmHg.   6. Consider definity on follow up studies    LHC 2017 Prox Cx to Mid Cx lesion, 30% stenosed. Mid RCA lesion, 30% stenosed. There is moderate to severe left ventricular systolic dysfunction. Mid LAD to Dist LAD lesion, 80% stenosed. Post intervention, there is a 0% residual stenosis. Ost LAD to Prox LAD lesion, 90% stenosed. Post intervention, there is a 0% residual stenosis.   1. Severe one-vessel coronary artery disease affecting mid and proximal/ostial LAD. Otherwise mild disease in the left circumflex and right coronary artery. 2. Moderately to severely reduced LV systolic function with an ejection fraction of 30-35% with severe hypokinesis of mid to distal anterior, apical and distal inferior walls. 3. Moderately elevated left ventricular end-diastolic pressure 4. Successful angioplasty and drug-eluting stent placement to the mid as well as proximal LAD.   Recommendations: Dual antiplatelet therapy for at least one  year. Aggressive treatment of risk factors, treatment of cardiomyopathy, smoking cessation and cardiac rehabilitation.         Physical Exam VS:  BP 120/72 (BP Location: Left Arm, Patient Position: Sitting, Cuff  Size: Normal)   Pulse 61   Ht 5' 4 (1.626 m)   Wt 178 lb (80.7 kg)   SpO2 95%   BMI 30.55 kg/m        Wt Readings from Last 3 Encounters:  04/14/24 178 lb (80.7 kg)  12/03/23 174 lb 12.8 oz (79.3 kg)  10/16/23 173 lb 6.4 oz (78.7 kg)    GEN: Well nourished, well developed in no acute distress NECK: No JVD; No carotid bruits CARDIAC: RRR, no murmurs, rubs, gallops RESPIRATORY:  Clear to auscultation without rales, wheezing or rhonchi  ABDOMEN: Soft, non-tender, non-distended EXTREMITIES:  No edema; No deformity   ASSESSMENT AND PLAN  CAD with remote stenting LHC in 2017 showed severe 1V CAD in the LAD treated with 2 overlapping stents, otherwise mild disease. The patient denies anginal symptoms. Continue ASA, Praluent , Coreg  and Crestor . I will update labs: CBC, CMET, lipids and TSH.   ICM Echo 07/2022 showed LVEF 55-60%, no WMA. She is euvolemic on exam. Continue Coreg  3.125mg BID and Losartan  100mg  daily.   HLD LDL 38. Repeat labs today. Continue Crestor  and Praluent .   COPD Tobacco use She is still smoking, 4 cigarettes daily. Complete cessation recommended.     Dispo: Follow-up in 1 year  Signed, Nidya Bouyer VEAR Fishman, PA-C

## 2024-04-15 ENCOUNTER — Ambulatory Visit: Payer: Self-pay | Admitting: Medical

## 2024-04-15 LAB — CBC
Hematocrit: 40.7 % (ref 34.0–46.6)
Hemoglobin: 13.3 g/dL (ref 11.1–15.9)
MCH: 29.9 pg (ref 26.6–33.0)
MCHC: 32.7 g/dL (ref 31.5–35.7)
MCV: 92 fL (ref 79–97)
Platelets: 281 x10E3/uL (ref 150–450)
RBC: 4.45 x10E6/uL (ref 3.77–5.28)
RDW: 12.9 % (ref 11.7–15.4)
WBC: 8.6 x10E3/uL (ref 3.4–10.8)

## 2024-04-15 LAB — COMPREHENSIVE METABOLIC PANEL WITH GFR
ALT: 20 IU/L (ref 0–32)
AST: 21 IU/L (ref 0–40)
Albumin: 4.5 g/dL (ref 3.9–4.9)
Alkaline Phosphatase: 100 IU/L (ref 49–135)
BUN/Creatinine Ratio: 17 (ref 12–28)
BUN: 16 mg/dL (ref 8–27)
Bilirubin Total: 0.2 mg/dL (ref 0.0–1.2)
CO2: 22 mmol/L (ref 20–29)
Calcium: 9.9 mg/dL (ref 8.7–10.3)
Chloride: 103 mmol/L (ref 96–106)
Creatinine, Ser: 0.93 mg/dL (ref 0.57–1.00)
Globulin, Total: 2.4 g/dL (ref 1.5–4.5)
Glucose: 110 mg/dL — ABNORMAL HIGH (ref 70–99)
Potassium: 4.6 mmol/L (ref 3.5–5.2)
Sodium: 145 mmol/L — ABNORMAL HIGH (ref 134–144)
Total Protein: 6.9 g/dL (ref 6.0–8.5)
eGFR: 66 mL/min/1.73 (ref >59)

## 2024-04-15 LAB — LDL CHOLESTEROL, DIRECT: LDL Direct: 80 mg/dL (ref 0–99)

## 2024-04-15 LAB — LIPID PANEL
Chol/HDL Ratio: 2.5 ratio (ref 0.0–4.4)
Cholesterol, Total: 185 mg/dL (ref 100–199)
HDL: 75 mg/dL (ref >39)
LDL Chol Calc (NIH): 82 mg/dL (ref 0–99)
Triglycerides: 169 mg/dL — ABNORMAL HIGH (ref 0–149)
VLDL Cholesterol Cal: 28 mg/dL (ref 5–40)

## 2024-04-15 LAB — TSH: TSH: 2.28 u[IU]/mL (ref 0.450–4.500)

## 2024-04-17 ENCOUNTER — Telehealth: Payer: Self-pay | Admitting: Cardiovascular Disease

## 2024-04-17 NOTE — Telephone Encounter (Signed)
 Pts daughter requesting a c/b to discuss results.

## 2024-04-17 NOTE — Telephone Encounter (Signed)
 Returned patient's daughter's call and relayed the most recent lab results and recommendations per Cadence Furth, PA-C.  Corean, daughter, verbalized understanding with all questions and concerns addressed at this time.  Corean expressed gratitude for the call back.

## 2024-05-06 ENCOUNTER — Other Ambulatory Visit: Payer: Self-pay | Admitting: Cardiovascular Disease

## 2024-06-05 ENCOUNTER — Other Ambulatory Visit: Payer: Self-pay | Admitting: Cardiovascular Disease
